# Patient Record
Sex: Female | Born: 1955 | Race: White | Hispanic: No | Marital: Married
Health system: Midwestern US, Community
[De-identification: ages and names within clinical notes are randomized; demographics above are authoritative.]

## PROBLEM LIST (undated history)

## (undated) DIAGNOSIS — E782 Mixed hyperlipidemia: Secondary | ICD-10-CM

## (undated) DIAGNOSIS — Z1231 Encounter for screening mammogram for malignant neoplasm of breast: Principal | ICD-10-CM

## (undated) DIAGNOSIS — E7800 Pure hypercholesterolemia, unspecified: Secondary | ICD-10-CM

## (undated) DIAGNOSIS — I341 Nonrheumatic mitral (valve) prolapse: Secondary | ICD-10-CM

## (undated) DIAGNOSIS — I4891 Unspecified atrial fibrillation: Secondary | ICD-10-CM

## (undated) DIAGNOSIS — I639 Cerebral infarction, unspecified: Secondary | ICD-10-CM

## (undated) DIAGNOSIS — I440 Atrioventricular block, first degree: Secondary | ICD-10-CM

## (undated) DIAGNOSIS — Q782 Osteopetrosis: Secondary | ICD-10-CM

## (undated) DIAGNOSIS — E876 Hypokalemia: Secondary | ICD-10-CM

## (undated) DIAGNOSIS — Z8673 Personal history of transient ischemic attack (TIA), and cerebral infarction without residual deficits: Secondary | ICD-10-CM

## (undated) DIAGNOSIS — Q251 Coarctation of aorta: Secondary | ICD-10-CM

## (undated) HISTORY — PX: TOE SURGERY: SHX1073

## (undated) HISTORY — DX: Nonrheumatic mitral (valve) prolapse: I34.1

## (undated) HISTORY — DX: Cerebral infarction, unspecified: I63.9

## (undated) HISTORY — DX: Personal history of transient ischemic attack (TIA), and cerebral infarction without residual deficits: Z86.73

## (undated) HISTORY — DX: Hypokalemia: E87.6

## (undated) HISTORY — DX: Osteopetrosis: Q78.2

## (undated) HISTORY — DX: Unspecified atrial fibrillation: I48.91

## (undated) HISTORY — PX: TUBAL LIGATION: SHX77

## (undated) HISTORY — DX: Atrioventricular block, first degree: I44.0

## (undated) HISTORY — DX: Coarctation of aorta: Q25.1

---

## 1999-01-12 ENCOUNTER — Other Ambulatory Visit: Admission: RE | Admit: 1999-01-12 | Discharge: 1999-01-12 | Payer: Self-pay | Admitting: Gynecology

## 1999-04-14 ENCOUNTER — Ambulatory Visit (HOSPITAL_COMMUNITY): Admission: RE | Admit: 1999-04-14 | Discharge: 1999-04-14 | Payer: Self-pay | Admitting: Gastroenterology

## 1999-04-14 ENCOUNTER — Encounter: Payer: Self-pay | Admitting: Gastroenterology

## 1999-07-27 ENCOUNTER — Encounter: Payer: Self-pay | Admitting: Cardiology

## 2000-01-19 ENCOUNTER — Other Ambulatory Visit: Admission: RE | Admit: 2000-01-19 | Discharge: 2000-01-19 | Payer: Self-pay | Admitting: Gynecology

## 2000-09-26 ENCOUNTER — Ambulatory Visit (HOSPITAL_COMMUNITY): Admission: RE | Admit: 2000-09-26 | Discharge: 2000-09-26 | Payer: Self-pay | Admitting: Gynecology

## 2001-02-25 ENCOUNTER — Other Ambulatory Visit: Admission: RE | Admit: 2001-02-25 | Discharge: 2001-02-25 | Payer: Self-pay | Admitting: Gynecology

## 2002-07-03 ENCOUNTER — Other Ambulatory Visit: Admission: RE | Admit: 2002-07-03 | Discharge: 2002-07-03 | Payer: Self-pay | Admitting: Gynecology

## 2003-10-14 ENCOUNTER — Other Ambulatory Visit: Admission: RE | Admit: 2003-10-14 | Discharge: 2003-10-14 | Payer: Self-pay | Admitting: Gynecology

## 2004-10-25 ENCOUNTER — Other Ambulatory Visit: Admission: RE | Admit: 2004-10-25 | Discharge: 2004-10-25 | Payer: Self-pay | Admitting: Gynecology

## 2005-10-26 ENCOUNTER — Other Ambulatory Visit: Admission: RE | Admit: 2005-10-26 | Discharge: 2005-10-26 | Payer: Self-pay | Admitting: Gynecology

## 2006-05-28 HISTORY — PX: MITRAL VALVE REPLACEMENT: SHX147

## 2006-10-28 ENCOUNTER — Other Ambulatory Visit: Admission: RE | Admit: 2006-10-28 | Discharge: 2006-10-28 | Payer: Self-pay | Admitting: Gynecology

## 2007-02-25 ENCOUNTER — Encounter: Payer: Self-pay | Admitting: Cardiology

## 2007-03-03 ENCOUNTER — Encounter: Payer: Self-pay | Admitting: Cardiology

## 2007-04-22 ENCOUNTER — Encounter (HOSPITAL_COMMUNITY): Admission: RE | Admit: 2007-04-22 | Discharge: 2007-05-28 | Payer: Self-pay | Admitting: Cardiology

## 2007-05-29 ENCOUNTER — Encounter (HOSPITAL_COMMUNITY): Admission: RE | Admit: 2007-05-29 | Discharge: 2007-07-11 | Payer: Self-pay | Admitting: Cardiology

## 2007-09-16 ENCOUNTER — Encounter: Payer: Self-pay | Admitting: Cardiology

## 2007-10-31 ENCOUNTER — Other Ambulatory Visit: Admission: RE | Admit: 2007-10-31 | Discharge: 2007-10-31 | Payer: Self-pay | Admitting: Gynecology

## 2008-10-01 ENCOUNTER — Encounter: Payer: Self-pay | Admitting: Cardiology

## 2008-10-21 ENCOUNTER — Encounter: Payer: Self-pay | Admitting: Cardiology

## 2008-11-15 ENCOUNTER — Other Ambulatory Visit: Admission: RE | Admit: 2008-11-15 | Discharge: 2008-11-15 | Payer: Self-pay | Admitting: Gynecology

## 2008-11-15 ENCOUNTER — Encounter: Payer: Self-pay | Admitting: Gynecology

## 2008-11-15 ENCOUNTER — Ambulatory Visit: Payer: Self-pay | Admitting: Gynecology

## 2009-01-07 ENCOUNTER — Encounter (INDEPENDENT_AMBULATORY_CARE_PROVIDER_SITE_OTHER): Payer: Self-pay | Admitting: *Deleted

## 2009-01-25 ENCOUNTER — Telehealth (INDEPENDENT_AMBULATORY_CARE_PROVIDER_SITE_OTHER): Payer: Self-pay | Admitting: *Deleted

## 2009-05-30 ENCOUNTER — Ambulatory Visit: Payer: Self-pay | Admitting: Cardiology

## 2009-05-30 DIAGNOSIS — I4891 Unspecified atrial fibrillation: Secondary | ICD-10-CM | POA: Insufficient documentation

## 2009-05-30 DIAGNOSIS — J45909 Unspecified asthma, uncomplicated: Secondary | ICD-10-CM

## 2009-05-30 DIAGNOSIS — M818 Other osteoporosis without current pathological fracture: Secondary | ICD-10-CM | POA: Insufficient documentation

## 2009-05-30 DIAGNOSIS — Z9889 Other specified postprocedural states: Secondary | ICD-10-CM

## 2009-05-30 HISTORY — DX: Other osteoporosis without current pathological fracture: M81.8

## 2009-05-30 HISTORY — DX: Other specified postprocedural states: Z98.890

## 2009-05-30 HISTORY — DX: Unspecified asthma, uncomplicated: J45.909

## 2009-10-06 ENCOUNTER — Ambulatory Visit: Payer: Self-pay

## 2009-10-06 ENCOUNTER — Ambulatory Visit: Payer: Self-pay | Admitting: Cardiology

## 2009-10-06 ENCOUNTER — Encounter: Payer: Self-pay | Admitting: Cardiology

## 2009-10-06 ENCOUNTER — Ambulatory Visit (HOSPITAL_COMMUNITY): Admission: RE | Admit: 2009-10-06 | Discharge: 2009-10-06 | Payer: Self-pay | Admitting: Cardiology

## 2009-10-06 ENCOUNTER — Ambulatory Visit: Payer: Self-pay | Admitting: Internal Medicine

## 2009-11-17 ENCOUNTER — Ambulatory Visit: Payer: Self-pay | Admitting: Gynecology

## 2009-11-17 ENCOUNTER — Other Ambulatory Visit: Admission: RE | Admit: 2009-11-17 | Discharge: 2009-11-17 | Payer: Self-pay | Admitting: Gynecology

## 2010-01-23 ENCOUNTER — Ambulatory Visit: Payer: Self-pay | Admitting: Cardiology

## 2010-06-29 NOTE — Assessment & Plan Note (Signed)
Summary: ROV   Visit Type:  Follow-up  CC:  No complains.  History of Present Illness: Doing well.  Very busy.  No real symptomatic limitations.  Wants to do a GXT, which would make sense.   Current Medications (verified): 1)  Aspir-Low 81 Mg Tbec (Aspirin) .... Take One Daily 2)  Bupropion Hcl 150 Mg Xr24h-Tab (Bupropion Hcl) .... Take One Daily 3)  Calcium Citrate 950 Mg Tabs (Calcium Citrate) .... Take One Daily 4)  Fish Oil 1000 Mg Caps (Omega-3 Fatty Acids) .... Take Two Daily 5)  Compete  Tabs (Multiple Vitamins-Minerals) .... Take One Daily  Allergies (verified): No Known Drug Allergies  Vital Signs:  Patient profile:   55 year old female Height:      67 inches Weight:      126.75 pounds BMI:     19.92 Pulse rate:   71 / minute Pulse rhythm:   regular Resp:     18 per minute BP sitting:   112 / 80  (right arm) Cuff size:   small  Vitals Entered By: Vikki Ports (Oct 06, 2009 10:07 AM)  Physical Exam  General:  Well developed, well nourished, in no acute distress. Head:  normocephalic and atraumatic Lungs:  Clear bilaterally to auscultation and percussion. Heart:  PMI non displaced.  No murmur, rub, or significant gallop.   Pulses:  pulses normal in all 4 extremities Extremities:  No clubbing or cyanosis. Neurologic:  Alert and oriented x 3.   EKG  Procedure date:  10/06/2009  Findings:      Normal rhythm.  Leftward axis.  IVCD.    Impression & Recommendations:  Problem # 1:  MITRAL VALVE REPLACEMENT, HX OF (ICD-V15.1) appears stable, with good exercise tolerance.  Echo reviewed with patient, formal report pending.   Will do exercise tolerance test.  Orders: Treadmill (Treadmill) EKG w/ Interpretation (93000)  Patient Instructions: 1)  Your physician recommends that you continue on your current medications as directed. Please refer to the Current Medication list given to you today. 2)  Your physician has requested that you have an exercise  tolerance test.  For further information please visit [email or web address]  Please also follow instruction sheet, as given.

## 2010-06-29 NOTE — Letter (Signed)
Summary: Appointment - Missed  [facility], Main Office  [address]   [place], [place] [address]   Phone: [phone]  Fax: [phone]     January 07, 2009 MRN: [id number]   [facility] [address] [place], [place]  [address]   Dear Ms. Ishler,  Our records indicate you missed your appointment on December 17, 2008 with Dr. Shirlee Latch. It is very important that we reach you to reschedule this appointment. We look forward to participating in your health care needs. Please contact us at the number listed above at your earliest convenience to reschedule this appointment.  Sincerely,    [facility] Scheduling Team

## 2010-06-29 NOTE — Progress Notes (Signed)
  FAXED RELEASE OF INFORMATION OVER TO DR.CHUNG'S OFFICE ASKING FOR RECORDS TO BE SENT HERE FOR DR.STUCKEY APPT @ 9:00AM  [facility]  January 25, 2009 12:20 PM    Appended Document:  Spoke with [facility] today they informed me that Dr.Chung is no longer @ this practice I would have to call [facility] and request the records, I called and left a Message with Sue Lush

## 2010-06-29 NOTE — Assessment & Plan Note (Signed)
Summary: MV replacement at [facility]   Visit Type:  Initial Consult  CC:  no complaints.  History of Present Illness: The patient lives across from Dr. Ricki Miller, had fast HR, was evaluated, found to have sig MR.   Patient had MVR at [facility] with Dr. Janey Genta in 2008.  She had had MVP for along time.  She subsequently was found to have worsening MR, and then was subsequently evaluated.   She underwent cardiac surgery at [facility] with Dr. Janey Genta.  Op note reviewed.   They attempted to repair the valve along with PFO closure.  There remained significant MR, and the valve was replaced.  By prior agreement, a tissue valve was inserted.   .  Patient opted to have a tissue valve, and did not want to be on Coumadin.  She did have some post op atrial fibrillation.   She does want she wants to do without any limitations.    Current Medications (verified): 1)  Aspir-Low 81 Mg Tbec (Aspirin) .... Take One Daily 2)  Bupropion Hcl 150 Mg Xr24h-Tab (Bupropion Hcl) .... Take One Daily 3)  Calcium Citrate 950 Mg Tabs (Calcium Citrate) .... Take One Daily 4)  Fish Oil 1000 Mg Caps (Omega-3 Fatty Acids) .... Take Two Daily 5)  Compete  Tabs (Multiple Vitamins-Minerals) .... Take One Daily  Allergies (verified): No Known Drug Allergies  Family History: Father 11 alive   diabetic, prior CVA Mother 58 going strong 5 brother and sisters---one young sister has hypertension.  Social History: Smoke.  Two children.  Occasional glass of wine.  Works full time.  Works for [employer], and travels with the jeans.  Her children are 15 and 11.    Review of Systems  The patient denies anorexia, fever, weight loss, weight gain, vision loss, decreased hearing, hoarseness, chest pain, syncope, dyspnea on exertion, peripheral edema, prolonged cough, headaches, hemoptysis, abdominal pain, melena, hematochezia, severe indigestion/heartburn, hematuria, incontinence, suspicious skin lesions, transient blindness, difficulty walking, depression, unusual  weight change, and abnormal bleeding.    Vital Signs:  Patient profile:   55 year old female Height:      67 inches Weight:      126 pounds Pulse rate:   72 / minute Pulse rhythm:   regular BP sitting:   102 / 80  (left arm)  Vitals Entered By: Jacquelin Hawking, CMA (May 30, 2009 2:36 PM)  Physical Exam  General:  Well developed, well nourished, in no acute distress. Head:  normocephalic and atraumatic Lungs:  Clear bilaterally to auscultation and percussion. Heart:  Prior surgery.  No definite murmur at present.  Normal S1 and S2 without rub or gallop. Abdomen:  Bowel sounds positive; abdomen soft and non-tender without masses, organomegaly, or hernias noted. No hepatosplenomegaly. Extremities:  No clubbing or cyanosis.  No edema. Neurologic:  Alert and oriented x 3.   EKG  Procedure date:  04/29/2009  Findings:      NSR.  Mild nonspecific IVCD.  No acute changes.  Nuclear ETT  Procedure date:  10/21/2008  Findings:      Report from Dr. Konrad Felix, MD, PhD.  No symptoms.  Minor couplet. Appropriate HR response.  Gated spec EF 39% which underestimates. Suggest echo.  No inducible ischemia.    Echocardiogram  Procedure date:  10/01/2008  Findings:      Normal size and borderline LV function..  Ef 45-50%.  Septal strain with hypokinesis of the anterior wall.  LA is enlarged.  Bioprosthesis, estimated valve area 2.7-2.9cm2.  NOrmal Pericardium.  Read by Elwin Mocha, MD, PhD.    Echocardiogram  Procedure date:  09/16/2007  Findings:      Normal LV size and function.  Torelli  Impression & Recommendations:  Problem # 1:  MITRAL VALVE REPLACEMENT, HX OF (ICD-V15.1) Patient had replacement of MV with [employer] 33-mm tissue prosthesis by Dr. Janey Genta.  She underwent attempted robotic MV repair by Dr. Janey Genta at [facility], but it could not be done because of heavy posterior calcification.  She remains stable.  Her EF was down slightly on last echo at [place], but  suggest we repeat this in near future.   She is agreeable.   Orders: EKG w/ Interpretation (93000) Echocardiogram (Echo)  Problem # 2:  ATRIAL FIBRILLATION (ICD-427.31) Just post op.  Not an ongoing issue. Her updated medication list for this problem includes:    Aspir-low 81 Mg Tbec (Aspirin) .Marland Kitchen... Take one daily  Problem # 3:  ASTHMA, UNSPECIFIED, UNSPECIFIED STATUS (ICD-493.90)  Problem # 4:  OTHER OSTEOPOROSIS (ICD-733.09)  Patient Instructions: 1)  Your physician recommends that you schedule a follow-up appointment in: May 2011 2)  Your physician has requested that you have an echocardiogram in May 2011.  Echocardiography is a painless test that uses sound waves to create images of your heart. It provides your doctor with information about the size and shape of your heart and how well your heart's chambers and valves are working.  This procedure takes approximately one hour. There are no restrictions for this procedure.

## 2010-06-29 NOTE — Letter (Signed)
Summary: EKG [facility]   EKG [facility]   Imported By: Roderic Ovens 12/27/2009 10:23:05  _____________________________________________________________________  External Attachment:    Type:   Image     Comment:   External Document

## 2010-06-29 NOTE — Op Note (Signed)
Summary: [facility]   [facility]   Imported By: Roderic Ovens 12/27/2009 10:16:45  _____________________________________________________________________  External Attachment:    Type:   Image     Comment:   External Document

## 2010-06-29 NOTE — Letter (Signed)
Summary: [facility]   [facility]   Imported By: Roderic Ovens 12/27/2009 10:16:04  _____________________________________________________________________  External Attachment:    Type:   Image     Comment:   External Document

## 2010-10-13 NOTE — H&P (Signed)
[facility]  Patient:    Martha Clark, Martha Clark                       MRN: [id number] Adm. Date:  09/26/00 Attending:  Gaetano Hawthorne. Lily Peer, M.D.                         History and Physical  CHIEF COMPLAINT:              Request elective permanent sterilization.  HISTORY:                      The patient is a 55 year old gravida 2, para 1, Ab1 who was seen in our office for her annual gynecological examination on August 1st and then subsequently on April 15th for her preoperative consultation.  The patient has voiced request for information on permanent sterilization and was provided with literature information from the ACOG on laparoscopic bilateral tubal sterilization procedure.  She had decided to proceed with that procedure and is scheduled for Thursday, May 2nd, at 7:30 a.m. at [facility].  The patient has been under the care of Dr. Macarthur Critchley. Torelli, for which she is followed for her mitral valve prolapse.  She recently had seen him and he has given her medical clearance to proceed with a tubal sterilization procedure.  The only recommendation was to have endocarditis prophylaxis.  She has a history of mitral valve prolapse with moderate mitral regurgitation and she has been asymptomatic from a cardiac standpoint.  There has been no history of any heart failure and she had an exercise echocardiogram done on February 06, 2000 and was reported an excellent exercise tolerance.  MEDICATIONS:                  She is currently on no medications.  She takes Allegra p.r.n.  PAST MEDICAL HISTORY:         She has had a tonsillectomy and adenoidectomy in the past.  She has had one normal spontaneous vaginal delivery.  PHYSICAL EXAMINATION:  GENERAL:                      Well-developed, well-nourished female.  HEENT:                        Unremarkable.  NECK:                         Supple.  Trachea midline.  No carotid bruits  or thyromegaly.  LUNGS:                        Clear to auscultation, without rhonchi or wheezes.  HEART:                        At parasternal border, she has systolic murmur, grade 2-3/6, but no gallops.  BREASTS:                      Recently not examined but were examined at her annual exam in August of the year 2001 which were reported to be normal.  ABDOMEN:                      Soft, nontender, without rebound or guarding.  PELVIC:  Bartholins, urethra and Skenes glands within normal limits.  Vagina and cervix:  No lesion or discharge.  Uterus anteverted, normal size, shape and consistency.  Adnexa without mass or tenderness.  RECTAL:                       Unremarkable.  ASSESSMENT:                   [age]-year-old gravida 2, para 1, abortus 1, who requests elective permanent sterilization.  Patient with history of myxomatous degeneration of the mitral valve and mildly dilated left ventricle and normal left ventricular systolic function.  She has a thickened mitral valve with bileaflet prolapse and some mitral regurgitation, recently had been cleared by cardiologist, Dr. Harland Dingwall.  Patient with an exercise echocardiogram done February 06, 2000 and was reported to be excellent exercise tolerance.  Only recommendation was endocarditis prophylaxis.  The patient will receive prophylactic antibiotic during the laparoscopic bilateral tubal sterilization procedure.  The risks, benefits, pros and cons of the procedure to include infection, bleeding and trauma to internal organs were discussed.  In the event that any technical complication, we had discussed that we may need to proceed with an open laparotomy and correct any trauma that may have been caused as a result of the laparoscopic technique, or in the event that the tubes are inaccessible, then she will have to have a laparotomy to gain access to the tubes to complete the operation by that  route, which would then require the patient to be in the hospital for several other days. Patient understands that this procedure is permanent and she will no longer be able to have any children.  All questions were answered and will follow accordingly.  PLAN:                         Patient is scheduled to undergo laparoscopic tubal sterilization procedure, Hulka clip technique, Thursday, May 2nd, at 7:30 a.m. at [facility].  Please have history and physical available. DD:  09/25/00 TD:  09/26/00 Job: [id number]

## 2010-10-13 NOTE — Op Note (Signed)
[facility]  Patient:    Martha Clark, Martha Clark                     MRN: [id number] Proc. Date: 09/26/00 Adm. Date:  [id number] Attending:  Tonye Royalty                           Operative Report  PREOPERATIVE DIAGNOSIS:       Request for elective permanent sterilization.  POSTOPERATIVE DIAGNOSIS:      Request for elective permanent sterilization.  OPERATION:                    Laparoscopic tubal sterilization procedure and                               Hulka clip technique, bilateral.  SURGEON:                      Juan H. Lily Peer, M.D.  ANESTHESIA:                   General endotracheal.  FINDINGS:                     Normal maternal pelvic anatomy.  INDICATIONS FOR PROCEDURE:    A 55 year old gravida 1, para 1, female requesting elective permanent sterilization.  The patient previously counseled as to risks, benefits, pros and cons of elective sterilization procedure.  The patient previously had been provided the literature information from the [employer] of Ob/Gyn, ________ laparoscopic tubal sterilization procedure.  DESCRIPTION OF PROCEDURE:     After the patient was adequate counseled, she was taken to the operating room.  She had received 1 g of Cefotan for SBE prophylaxis due to her history of mitral valve prolapse.  While on the operating room table, she was placed in the lithotomy position after general endotracheal anesthesia was obtained.  Her abdomen, vagina, and perineum were prepped and draped in the usual sterile fashion.  A red rubber Roxan Hockey was utilized the evacuate the bladder contents and examination under anesthesia confirmed an anteverted uterus with no palpable adnexal masses, and the Hulka tenaculum had been placed for manipulation during the laparoscopic procedure. The bladder had been evacuated of its contents with the red rubber Roxan Hockey for approximately 75 cc.  After the drapes were in place, a small  stab incision was made at the infraumbilical region followed by insertion of the Veress needle.  The opening intra-abdominal pressure was 5 mmHg, and approximately 2 L of carbon dioxide were insufflated into the peritoneal cavity.  After this, the Veress needle was removed.  The 10 mm trocar was inserted and the laparoscope was inserted into the peritoneal cavity for a systematic inspection of the entire pelvic cavity with no gross abnormalities noted.  A second puncture site was made 2 cm above the symphysis at the midline with a 5 mm trocar for an additional port for instrumentation.  The right fallopian tube was identified and were the self-retaining grasper was placed under tension, the proximal one-third portion was identified, and the Hulka clip was placed, completely occluding the small segment of the fallopian tube, and a similar procedure was carried down the contralateral side.  After pictures were obtained, the carbon dioxide was removed.  The instruments were removed.  The subumbilical incision in the fascia  was closed with a locking stitch suture of 0 Vicryl suture and the skin was reapproximated with subcuticular stitch of 4-0 plain catgut suture.  The suprapubic incision was closed with interrupted sutures of 4-0 plain catgut suture.  For postoperative analgesia, 10 cc of 0.25% Marcaine was infiltrated into both incision sites and dressings were placed afterwards.  The Hulka tenaculum was removed.  The patient was awakened and transferred to the recovery room with stable vital signs.  She received 30 mg of Toradol and brought to the recovery room.  Blood loss was minimal and fluid resuscitation consisted of 1600 cc of lactated Ringers. DD:  09/26/00 TD:  09/27/00 Job: [id number]

## 2010-12-20 ENCOUNTER — Ambulatory Visit: Payer: Self-pay | Admitting: Cardiology

## 2010-12-20 ENCOUNTER — Encounter: Payer: Self-pay | Admitting: Gynecology

## 2010-12-27 ENCOUNTER — Encounter: Payer: Self-pay | Admitting: Cardiology

## 2010-12-28 ENCOUNTER — Ambulatory Visit (INDEPENDENT_AMBULATORY_CARE_PROVIDER_SITE_OTHER): Payer: 59 | Admitting: Cardiology

## 2010-12-28 ENCOUNTER — Encounter: Payer: Self-pay | Admitting: Cardiology

## 2010-12-28 VITALS — BP 115/78 | HR 68 | Resp 14 | Ht 67.0 in | Wt 122.0 lb

## 2010-12-28 DIAGNOSIS — Z9889 Other specified postprocedural states: Secondary | ICD-10-CM

## 2010-12-28 DIAGNOSIS — I059 Rheumatic mitral valve disease, unspecified: Secondary | ICD-10-CM

## 2010-12-28 DIAGNOSIS — I341 Nonrheumatic mitral (valve) prolapse: Secondary | ICD-10-CM

## 2010-12-28 NOTE — Patient Instructions (Signed)
Your physician recommends that you schedule a follow-up appointment in: 1 year   Your physician has requested that you have an echocardiogram. Echocardiography is a painless test that uses sound waves to create images of your heart. It provides your doctor with information about the size and shape of your heart and how well your heart's chambers and valves are working. This procedure takes approximately one hour. There are no restrictions for this procedure. To be done in 1 year.   Your physician has requested that you have an exercise tolerance test. For further information please visit [email or web address] Please also follow instruction sheet, as given. To be done in 1 year.

## 2011-01-06 NOTE — Assessment & Plan Note (Addendum)
She seems to be doing well. We discussed her surgery and the findings.  It was done by Dr. Janey Genta at [facility].  Would recommend an echo every couple of years given bioprosthetic MV.  She should continue her activity as she chooses without limitation.

## 2011-01-06 NOTE — Progress Notes (Signed)
HPI:  She and I had a nice discussion today.  She is doing well.  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 She is however excited about the culture change at [employer], and is rejuvenated to some extent.  We shared the experience about the [facility].  She does not get short of breath with exercise, and is staying active.  No orthopnea.  We reviewed her lifeline screening tests that she got:  Her BMI was 19, ABI, carotids, and abdominal aorta are all listed as normal.  We will send this file for report.  Her CRP was 1.0, LDL was 114.  Total was 187.  We talked about all of these entities.  HDL is 58.    Current Outpatient Prescriptions  Medication Sig Dispense Refill  . aspirin 81 MG tablet Take 81 mg by mouth daily.        Marland Kitchen buPROPion (WELLBUTRIN XL) 150 MG 24 hr tablet Take 1 capsule by mouth Daily.      . calcium citrate-vitamin D (CITRACAL+D) 315-200 MG-UNIT per tablet Take 1 tablet by mouth daily.        . Cholecalciferol (VITAMIN D) 1000 UNITS capsule Take 1,000 Units by mouth daily.        . Coenzyme Q10 (COQ-10) 100 MG CAPS Take 1 tablet by mouth daily.        Marland Kitchen MAGNESIUM CHLORIDE PO Take 1 tablet by mouth daily.        . Multiple Vitamin (MULTIVITAMIN) capsule Take 1 capsule by mouth daily.        . Omega-3 Fatty Acids (FISH OIL PO) 2 teaspoons daily         No Known Allergies  Past Medical History  Diagnosis Date  . Atrial fibrillation   . Asthma   . Osteoporosis   . Mitral valve prolapse     No past surgical history on file.  No family history on file.  History   Social History  . Marital Status: Unknown    Spouse Name: N/A    Number of Children: N/A  . Years of Education: N/A   Occupational History  . Not on file.   Social History Main Topics  . Smoking status: Never Smoker   . Smokeless tobacco: Not on file  . Alcohol Use: Not on  file  . Drug Use: Not on file  . Sexually Active: Not on file   Other Topics Concern  . Not on file   Social History Narrative  . No narrative on file    ROS: Please see the HPI.  All other systems reviewed and negative.  PHYSICAL EXAM:  BP 115/78  Pulse 68  Resp 14  Ht 5\' 7"  (1.702 m)  Wt 122 lb (55.339 kg)  BMI 19.11 kg/m2  General: Well developed, well nourished, in no acute distress. Head:  Normocephalic and atraumatic. Neck: no JVD Lungs: Clear to auscultation and percussion. Heart: Normal S1 and S2.  No murmur, rubs or gallops.  Abdomen:  Normal bowel sounds; soft; non tender; no organomegaly Pulses: Pulses normal in all 4 extremities. Extremities: No clubbing or cyanosis. No edema. Neurologic: Alert and oriented x 3.  EKG:  NSR.  LAE.  Leftward axis.  IVCD.    ASSESSMENT AND PLAN:

## 2011-01-24 ENCOUNTER — Encounter: Payer: Self-pay | Admitting: Gynecology

## 2011-09-27 ENCOUNTER — Observation Stay (HOSPITAL_COMMUNITY)
Admission: EM | Admit: 2011-09-27 | Discharge: 2011-09-28 | Disposition: A | Discharge status: Home / Self Care | Payer: 59 | Attending: Emergency Medicine | Admitting: Emergency Medicine

## 2011-09-27 ENCOUNTER — Emergency Department (HOSPITAL_COMMUNITY): Payer: 59

## 2011-09-27 ENCOUNTER — Encounter (HOSPITAL_COMMUNITY): Payer: Self-pay | Admitting: Emergency Medicine

## 2011-09-27 DIAGNOSIS — I059 Rheumatic mitral valve disease, unspecified: Secondary | ICD-10-CM | POA: Insufficient documentation

## 2011-09-27 DIAGNOSIS — I4891 Unspecified atrial fibrillation: Secondary | ICD-10-CM | POA: Insufficient documentation

## 2011-09-27 DIAGNOSIS — M810 Age-related osteoporosis without current pathological fracture: Secondary | ICD-10-CM | POA: Insufficient documentation

## 2011-09-27 DIAGNOSIS — R079 Chest pain, unspecified: Principal | ICD-10-CM | POA: Insufficient documentation

## 2011-09-27 DIAGNOSIS — R0602 Shortness of breath: Secondary | ICD-10-CM | POA: Insufficient documentation

## 2011-09-27 DIAGNOSIS — J45909 Unspecified asthma, uncomplicated: Secondary | ICD-10-CM | POA: Insufficient documentation

## 2011-09-27 LAB — DIFFERENTIAL
Basophils Absolute: 0 10*3/uL (ref 0.0–0.1)
Basophils Relative: 0 % (ref 0–1)
Eosinophils Absolute: 0.1 10*3/uL (ref 0.0–0.7)
Eosinophils Relative: 1 % (ref 0–5)
Lymphocytes Relative: 14 % (ref 12–46)
Lymphs Abs: 1.3 10*3/uL (ref 0.7–4.0)
Monocytes Absolute: 0.7 10*3/uL (ref 0.1–1.0)
Monocytes Relative: 7 % (ref 3–12)
Neutro Abs: 7.6 10*3/uL (ref 1.7–7.7)
Neutrophils Relative %: 78 % — ABNORMAL HIGH (ref 43–77)

## 2011-09-27 LAB — COMPREHENSIVE METABOLIC PANEL
ALT: 17 U/L (ref 0–35)
AST: 24 U/L (ref 0–37)
Albumin: 3.8 g/dL (ref 3.5–5.2)
Alkaline Phosphatase: 95 U/L (ref 39–117)
BUN: 16 mg/dL (ref 6–23)
CO2: 29 mEq/L (ref 19–32)
Calcium: 9.6 mg/dL (ref 8.4–10.5)
Chloride: 104 mEq/L (ref 96–112)
Creatinine, Ser: 0.71 mg/dL (ref 0.50–1.10)
GFR calc Af Amer: >90 mL/min (ref >90)
GFR calc non Af Amer: >90 mL/min (ref >90)
Glucose, Bld: 94 mg/dL (ref 70–99)
Potassium: 3.8 mEq/L (ref 3.5–5.1)
Sodium: 141 mEq/L (ref 135–145)
Total Bilirubin: 0.4 mg/dL (ref 0.3–1.2)
Total Protein: 6.8 g/dL (ref 6.0–8.3)

## 2011-09-27 LAB — POCT I-STAT TROPONIN I
Troponin i, poc: 0 ng/mL (ref 0.00–0.08)
Troponin i, poc: 0.03 ng/mL (ref 0.00–0.08)
Troponin i, poc: 0 ng/mL (ref 0.00–0.08)

## 2011-09-27 LAB — URINALYSIS, ROUTINE W REFLEX MICROSCOPIC
Bilirubin Urine: NEGATIVE
Glucose, UA: NEGATIVE mg/dL
Hgb urine dipstick: NEGATIVE
Ketones, ur: NEGATIVE mg/dL
Leukocytes, UA: NEGATIVE
Nitrite: NEGATIVE
Protein, ur: NEGATIVE mg/dL
Specific Gravity, Urine: 1.016 (ref 1.005–1.030)
Urobilinogen, UA: 0.2 mg/dL (ref 0.0–1.0)
pH: 8 (ref 5.0–8.0)

## 2011-09-27 LAB — CBC
HCT: 42.5 % (ref 36.0–46.0)
Hemoglobin: 14.8 g/dL (ref 12.0–15.0)
MCH: 29.9 pg (ref 26.0–34.0)
MCHC: 34.8 g/dL (ref 30.0–36.0)
MCV: 85.9 fL (ref 78.0–100.0)
Platelets: 157 10*3/uL (ref 150–400)
RBC: 4.95 MIL/uL (ref 3.87–5.11)
RDW: 12.6 % (ref 11.5–15.5)
WBC: 9.7 10*3/uL (ref 4.0–10.5)

## 2011-09-27 LAB — D-DIMER, QUANTITATIVE: D-Dimer, Quant: 0.31 ug/mL-FEU (ref 0.00–0.48)

## 2011-09-27 MED ORDER — NITROGLYCERIN 0.4 MG SL SUBL: 0.4000 mg | SUBLINGUAL_TABLET | SUBLINGUAL | Status: DC | PRN

## 2011-09-27 MED ORDER — GI COCKTAIL ~~LOC~~
30.0000 mL | Freq: Once | ORAL | Status: AC
  Administered 2011-09-27: 30 mL via ORAL
  Filled 2011-09-27: qty 30

## 2011-09-27 MED ORDER — ASPIRIN 81 MG PO CHEW: 324.0000 mg | CHEWABLE_TABLET | Freq: Once | ORAL | Status: DC

## 2011-09-27 MED ORDER — FENTANYL CITRATE 0.05 MG/ML IJ SOLN
50.0000 ug | Freq: Once | INTRAMUSCULAR | Status: AC
  Administered 2011-09-27: 50 ug via INTRAVENOUS
  Filled 2011-09-27: qty 2

## 2011-09-27 MED ORDER — METOPROLOL TARTRATE 25 MG PO TABS
100.0000 mg | ORAL_TABLET | Freq: Once | ORAL | Status: AC
  Administered 2011-09-27: 100 mg via ORAL
  Filled 2011-09-27: qty 4

## 2011-09-27 NOTE — ED Notes (Signed)
IV team paged for 18g for CT heart.

## 2011-09-27 NOTE — ED Notes (Addendum)
Spoke with Dr. Reche Dixon [phone]) regarding CT. Will give metoprolol per protocol orders and call back in approx 45 minutes to inform him about heart rate.

## 2011-09-27 NOTE — ED Provider Notes (Signed)
History     CSN: [id number]  Arrival date & time 09/27/11  0843   First MD Initiated Contact with Patient 09/27/11 0931      11:19 AM HPI Ports she was sleeping this morning when she was awakened abruptly by a substernal chest pain. States pain felt like severe heartburn. Reports pain has somewhat subsided but she continues to have mild pressure-like pain. Reports symptoms were associated with mild shortness of breath. Denies nausea, diaphoresis, abdominal pain, nausea, vomiting, back pain. Patient denies history of hypertension, hyperlipidemia, coronary artery disease, history of MI, history of PE/DVT, hormone therapy, cancer. Patient reports that she recently traveled to [place] by air yesterday and then returned.  Patient is a 56 y.o. female presenting with chest pain. The history is provided by the patient.  Chest Pain The chest pain began 1 - 2 hours ago. Chest pain occurs constantly. The chest pain is unchanged. The severity of the pain is severe. The quality of the pain is described as sharp, burning and pressure-like. The pain does not radiate. Primary symptoms include shortness of breath. Pertinent negatives for primary symptoms include no fever, no fatigue, no cough, no wheezing, no palpitations, no abdominal pain, no nausea, no vomiting, no dizziness and no altered mental status.  Pertinent negatives for associated symptoms include no claudication, no diaphoresis, no lower extremity edema, no numbness and no weakness. She tried antacids for the symptoms.  Pertinent negatives for past medical history include no CAD, no diabetes, no hyperhomocysteinemia, no hyperlipidemia, no MI and no PE.     Past Medical History  Diagnosis Date  . Atrial fibrillation   . Asthma   . Osteoporosis   . Mitral valve prolapse     Past Surgical History  Procedure Date  . Tubal ligation     No family history on file.  History  Substance Use Topics  . Smoking status: Never Smoker   .  Smokeless tobacco: Not on file  . Alcohol Use: No    OB History    Grav Para Term Preterm Abortions TAB SAB Ect Mult Living                  Review of Systems  Constitutional: Negative for fever, diaphoresis and fatigue.  HENT: Negative for neck pain.   Respiratory: Positive for shortness of breath. Negative for cough and wheezing.   Cardiovascular: Positive for chest pain. Negative for palpitations, claudication and leg swelling.  Gastrointestinal: Negative for nausea, vomiting and abdominal pain.  Neurological: Negative for dizziness, weakness, numbness and headaches.  Psychiatric/Behavioral: Negative for altered mental status.  All other systems reviewed and are negative.    Allergies  Review of patient's allergies indicates no known allergies.  Home Medications   Current Outpatient Rx  Name Route Sig Dispense Refill  . ASPIRIN 81 MG PO TABS Oral Take 81 mg by mouth daily.      . BUPROPION HCL ER (XL) 150 MG PO TB24 Oral Take 1 capsule by mouth Daily.    Marland Kitchen CALCIUM CITRATE-VITAMIN D 315-200 MG-UNIT PO TABS Oral Take 1 tablet by mouth daily.      Marland Kitchen VITAMIN D 1000 UNITS PO CAPS Oral Take 1,000 Units by mouth daily.      . COQ-10 100 MG PO CAPS Oral Take 1 tablet by mouth daily.      Marland Kitchen FISH OIL OIL Does not apply 15 mLs by Does not apply route daily.    Marland Kitchen MAGNESIUM CHLORIDE PO Oral Take 1  tablet by mouth daily.      . MULTIVITAMINS PO CAPS Oral Take 1 capsule by mouth daily.      Marland Kitchen PRESCRIPTION MEDICATION Topical Apply 20 drops topically 2 (two) times daily. Sample topical medication from [facility] for swelling in right foot.    Marland Kitchen VITAMIN C 500 MG PO TABS Oral Take 500 mg by mouth daily.      BP 124/78  Pulse 80  Temp(Src) 98 F (36.7 C) (Oral)  Resp 18  Ht 5\' 7"  (1.702 m)  Wt 125 lb (56.7 kg)  BMI 19.58 kg/m2  SpO2 99%  Physical Exam  Vitals reviewed. Constitutional: She is oriented to person, place, and time. Vital signs are normal. She appears  well-developed and well-nourished.  HENT:  Head: Normocephalic and atraumatic.  Eyes: Conjunctivae are normal. Pupils are equal, round, and reactive to light.  Neck: Normal range of motion. Neck supple.  Cardiovascular: Normal rate, regular rhythm and normal heart sounds.  Exam reveals no friction rub.   No murmur heard. Pulmonary/Chest: Effort normal and breath sounds normal. She has no wheezes. She has no rhonchi. She has no rales. She exhibits no tenderness.  Musculoskeletal: Normal range of motion.  Neurological: She is alert and oriented to person, place, and time. Coordination normal.  Skin: Skin is warm and dry. No rash noted. No erythema. No pallor.    ED Course  Procedures   Results for orders placed during the hospital encounter of 09/27/11  CBC      Component Value Range   WBC 9.7  4.0 - 10.5 (K/uL)   RBC 4.95  3.87 - 5.11 (MIL/uL)   Hemoglobin 14.8  12.0 - 15.0 (g/dL)   HCT 66.4  40.3 - 47.4 (%)   MCV 85.9  78.0 - 100.0 (fL)   MCH 29.9  26.0 - 34.0 (pg)   MCHC 34.8  30.0 - 36.0 (g/dL)   RDW 25.9  56.3 - 87.5 (%)   Platelets 157  150 - 400 (K/uL)  DIFFERENTIAL      Component Value Range   Neutrophils Relative 78 (*) 43 - 77 (%)   Neutro Abs 7.6  1.7 - 7.7 (K/uL)   Lymphocytes Relative 14  12 - 46 (%)   Lymphs Abs 1.3  0.7 - 4.0 (K/uL)   Monocytes Relative 7  3 - 12 (%)   Monocytes Absolute 0.7  0.1 - 1.0 (K/uL)   Eosinophils Relative 1  0 - 5 (%)   Eosinophils Absolute 0.1  0.0 - 0.7 (K/uL)   Basophils Relative 0  0 - 1 (%)   Basophils Absolute 0.0  0.0 - 0.1 (K/uL)  COMPREHENSIVE METABOLIC PANEL      Component Value Range   Sodium 141  135 - 145 (mEq/L)   Potassium 3.8  3.5 - 5.1 (mEq/L)   Chloride 104  96 - 112 (mEq/L)   CO2 29  19 - 32 (mEq/L)   Glucose, Bld 94  70 - 99 (mg/dL)   BUN 16  6 - 23 (mg/dL)   Creatinine, Ser 6.43  0.50 - 1.10 (mg/dL)   Calcium 9.6  8.4 - 32.9 (mg/dL)   Total Protein 6.8  6.0 - 8.3 (g/dL)   Albumin 3.8  3.5 - 5.2 (g/dL)    AST 24  0 - 37 (U/L)   ALT 17  0 - 35 (U/L)   Alkaline Phosphatase 95  39 - 117 (U/L)   Total Bilirubin 0.4  0.3 - 1.2 (mg/dL)  GFR calc non Af Amer >90  >90 (mL/min)   GFR calc Af Amer >90  >90 (mL/min)  D-DIMER, QUANTITATIVE      Component Value Range   D-Dimer, Quant 0.31  0.00 - 0.48 (ug/mL-FEU)  POCT I-STAT TROPONIN I      Component Value Range   Troponin i, poc 0.00  0.00 - 0.08 (ng/mL)   Comment 3            Dg Chest 2 View  09/27/2011  *RADIOLOGY REPORT*  Clinical Data: Chest pain and weakness  CHEST - 2 VIEW  Comparison: None  Findings: Heart size appears normal.  No pleural effusion or pulmonary edema.  Lungs appear hyperinflated and there are coarsened interstitial markings.  No focal airspace consolidation identified.  IMPRESSION:  1.  No acute cardiopulmonary abnormalities.  Original Report Authenticated By: Rosealee Albee, M.D.   ED ECG REPORT   Date: 09/27/2011  EKG Time: 11:52 AM  Rate: 89  Rhythm: Sinus rhythm with first-degree AV block,  there are no previous tracings available for comparison  Axis: left axis deviation   Intervals:nonspecific intraventricular conduction delay  ST&T Change: none              MDM   Will order 3 hour troponin. Pending move to CDU for chest pain protocol. Patient has low risk for ACS. Discussed plan with Dr. Ignacia Palma and Langley Adie PA-C       Thomasene Lot, PA-C 09/27/11 1151  Thomasene Lot, PA-C 09/27/11 1155  Thomasene Lot, PA-C 09/27/11 1204

## 2011-09-27 NOTE — ED Provider Notes (Signed)
12:03 PM New onset of chest pain in precordium early this morning, felt a burning feeling and some sharp pains.  Pain has largely subsided now.  She had had no prior similar episode.  Her heath is usually good.  Exam shows lungs clear, heart sounds normal, abdomen soft and nontender.  EKG non-acute, initial TNI negative.  I recommended observation under the chest pain protocol in the CDU.   Carleene Cooper III, MD 09/27/11 [phone]

## 2011-09-27 NOTE — ED Provider Notes (Signed)
Medical screening examination/treatment/procedure(s) were conducted as a shared visit with non-physician practitioner(s) and myself.  I personally evaluated the patient during the encounter 12:03 PM  New onset of chest pain in precordium early this morning, felt a burning feeling and some sharp pains. Pain has largely subsided now. She had had no prior similar episode. Her heath is usually good. Exam shows lungs clear, heart sounds normal, abdomen soft and nontender. EKG non-acute, initial TNI negative. I recommended observation under the chest pain protocol in the CDU.      Carleene Cooper III, MD 09/27/11 2023

## 2011-09-27 NOTE — ED Provider Notes (Signed)
Medical screening examination/treatment/procedure(s) were performed by non-physician practitioner and as supervising physician I was immediately available for consultation/collaboration.   Carleene Cooper III, MD 09/27/11 2024

## 2011-09-27 NOTE — ED Notes (Signed)
Ambulated pt 

## 2011-09-27 NOTE — ED Notes (Signed)
Reports sudden onset CP at 0500 while sleeping, was SOB and nauseas - on arrival denies SOB/nausea, is tearful and anxious on arrival, states 5/10, took 81 mg ASA pta, no recent illness or other complaints, NAD

## 2011-09-27 NOTE — ED Provider Notes (Signed)
History     CSN: [id number]  Arrival date & time 09/27/11  8657   First MD Initiated Contact with Patient 09/27/11 [phone]      Chief Complaint  Patient presents with  . Chest Pain    (Consider location/radiation/quality/duration/timing/severity/associated sxs/prior treatment) HPI  Past Medical History  Diagnosis Date  . Atrial fibrillation   . Asthma   . Osteoporosis   . Mitral valve prolapse     Past Surgical History  Procedure Date  . Tubal ligation     No family history on file.  History  Substance Use Topics  . Smoking status: Never Smoker   . Smokeless tobacco: Not on file  . Alcohol Use: No    OB History    Grav Para Term Preterm Abortions TAB SAB Ect Mult Living                  Review of Systems  Allergies  Review of patient's allergies indicates no known allergies.  Home Medications   Current Outpatient Rx  Name Route Sig Dispense Refill  . ASPIRIN 81 MG PO TABS Oral Take 81 mg by mouth daily.      . BUPROPION HCL ER (XL) 150 MG PO TB24 Oral Take 1 capsule by mouth Daily.    Marland Kitchen CALCIUM CITRATE-VITAMIN D 315-200 MG-UNIT PO TABS Oral Take 1 tablet by mouth daily.      Marland Kitchen VITAMIN D 1000 UNITS PO CAPS Oral Take 1,000 Units by mouth daily.      . COQ-10 100 MG PO CAPS Oral Take 1 tablet by mouth daily.      Marland Kitchen FISH OIL OIL Does not apply 15 mLs by Does not apply route daily.    Marland Kitchen MAGNESIUM CHLORIDE PO Oral Take 1 tablet by mouth daily.      . MULTIVITAMINS PO CAPS Oral Take 1 capsule by mouth daily.      Marland Kitchen PRESCRIPTION MEDICATION Topical Apply 20 drops topically 2 (two) times daily. Sample topical medication from [facility] for swelling in right foot.    Marland Kitchen VITAMIN C 500 MG PO TABS Oral Take 500 mg by mouth daily.      BP 110/71  Pulse 63  Temp(Src) 98 F (36.7 C) (Oral)  Resp 18  Ht 5\' 7"  (1.702 m)  Wt 128 lb 6.4 oz (58.242 kg)  BMI 20.11 kg/m2  SpO2 98%  Physical Exam  ED Course  Procedures (including critical care time)  Labs  Reviewed  DIFFERENTIAL - Abnormal; Notable for the following:    Neutrophils Relative 78 (*)    All other components within normal limits  CBC  COMPREHENSIVE METABOLIC PANEL  URINALYSIS, ROUTINE W REFLEX MICROSCOPIC  D-DIMER, QUANTITATIVE  POCT I-STAT TROPONIN I   Dg Chest 2 View  09/27/2011  *RADIOLOGY REPORT*  Clinical Data: Chest pain and weakness  CHEST - 2 VIEW  Comparison: None  Findings: Heart size appears normal.  No pleural effusion or pulmonary edema.  Lungs appear hyperinflated and there are coarsened interstitial markings.  No focal airspace consolidation identified.  IMPRESSION:  1.  No acute cardiopulmonary abnormalities.  Original Report Authenticated By: Rosealee Albee, M.D.     1. Chest pain     3:20 PM Patient on CPP, pending stress echo tomorrow morning.   Vital signs reviewed and are as follows: Filed Vitals:   09/27/11 1445  BP: 110/71  Pulse: 63  Temp:   Resp: 18  BP 110/71  Pulse 63  Temp(Src) 98  F (36.7 C) (Oral)  Resp 18  Ht 5\' 7"  (1.702 m)  Wt 128 lb 6.4 oz (58.242 kg)  BMI 20.11 kg/m2  SpO2 98%  4:28 PM Patient has only had one troponin drawn. I have ordered an additional now and another at 20:00.   Exam:  Gen NAD; Heart RRR, systolic murmur, no m/r/g; Lungs CTAB; Abd soft, NT, no rebound or guarding; Ext 2+ pedal pulses bilaterally, no edema.  11:34 PM Patient resting comfortably in room.   Plan: Exercise stress eco in morning.   Dr. Fonnie Jarvis aware of patient.   MDM  Chest pain protocol.         [place], [place] 09/27/11 [phone]

## 2011-09-27 NOTE — ED Notes (Signed)
To CDU room 4.

## 2011-09-27 NOTE — ED Provider Notes (Signed)
CTA questioned by Dr. Shirlee Latch as the study may be inconclusive given the patient's previous sternotomy for valvular surgery. Study switched to Stress Echo, however, patient already received Metoprolol and cannot have study done until a.m. Discussed with patient and agreed.   Rodena Medin, PA-C 09/27/11 1433

## 2011-09-28 DIAGNOSIS — I059 Rheumatic mitral valve disease, unspecified: Secondary | ICD-10-CM

## 2011-09-28 LAB — TROPONIN I: Troponin I: <0.3 ng/mL (ref <0.30)

## 2011-09-28 NOTE — ED Provider Notes (Signed)
7:37am  Patient is in CDU under observation, chest pain protocol.   This is a shared visit with Dr Ignacia Palma. Patient reports she has had great improvement of her chest pain overnight and can now take deep breaths without much pain.  Reviewed all results with patient.  On exam, pt is A&Ox4, NAD, RRR, no m/r/g, CTAB, abd soft, NT, extremities without edema, distal pulses intact and equal bilaterally.  Plan is for stress echo this morning.  Will continue to follow.    11:38 AM Received a call from Dr Tenny Craw who reports patient had a nondiagnostic study.  Patient exercised for 11 minutes, without pain, but her HR never increased beyond 93.  Pt was given metoprolol yesterday in anticipation of coronary CT, though plans were changed out of concern that her s/p mitral valve replacement would not allow for a decent study.  Dr Ignacia Palma made aware, patient also made aware.  Dr Ignacia Palma to speak with patient's cardiologist Dr Riley Kill.    11:56 AM Per Dr Ignacia Palma, patient may be d/c home with cardiology follow up.  Patient verbalizes understanding and agrees with plan.    Results for orders placed during the hospital encounter of 09/27/11  CBC      Component Value Range   WBC 9.7  4.0 - 10.5 (K/uL)   RBC 4.95  3.87 - 5.11 (MIL/uL)   Hemoglobin 14.8  12.0 - 15.0 (g/dL)   HCT 62.1  30.8 - 65.7 (%)   MCV 85.9  78.0 - 100.0 (fL)   MCH 29.9  26.0 - 34.0 (pg)   MCHC 34.8  30.0 - 36.0 (g/dL)   RDW 84.6  96.2 - 95.2 (%)   Platelets 157  150 - 400 (K/uL)  DIFFERENTIAL      Component Value Range   Neutrophils Relative 78 (*) 43 - 77 (%)   Neutro Abs 7.6  1.7 - 7.7 (K/uL)   Lymphocytes Relative 14  12 - 46 (%)   Lymphs Abs 1.3  0.7 - 4.0 (K/uL)   Monocytes Relative 7  3 - 12 (%)   Monocytes Absolute 0.7  0.1 - 1.0 (K/uL)   Eosinophils Relative 1  0 - 5 (%)   Eosinophils Absolute 0.1  0.0 - 0.7 (K/uL)   Basophils Relative 0  0 - 1 (%)   Basophils Absolute 0.0  0.0 - 0.1 (K/uL)  COMPREHENSIVE METABOLIC PANEL    Component Value Range   Sodium 141  135 - 145 (mEq/L)   Potassium 3.8  3.5 - 5.1 (mEq/L)   Chloride 104  96 - 112 (mEq/L)   CO2 29  19 - 32 (mEq/L)   Glucose, Bld 94  70 - 99 (mg/dL)   BUN 16  6 - 23 (mg/dL)   Creatinine, Ser 8.41  0.50 - 1.10 (mg/dL)   Calcium 9.6  8.4 - 32.4 (mg/dL)   Total Protein 6.8  6.0 - 8.3 (g/dL)   Albumin 3.8  3.5 - 5.2 (g/dL)   AST 24  0 - 37 (U/L)   ALT 17  0 - 35 (U/L)   Alkaline Phosphatase 95  39 - 117 (U/L)   Total Bilirubin 0.4  0.3 - 1.2 (mg/dL)   GFR calc non Af Amer >90  >90 (mL/min)   GFR calc Af Amer >90  >90 (mL/min)  URINALYSIS, ROUTINE W REFLEX MICROSCOPIC      Component Value Range   Color, Urine YELLOW  YELLOW    APPearance CLEAR  CLEAR    Specific  Gravity, Urine 1.016  1.005 - 1.030    pH 8.0  5.0 - 8.0    Glucose, UA NEGATIVE  NEGATIVE (mg/dL)   Hgb urine dipstick NEGATIVE  NEGATIVE    Bilirubin Urine NEGATIVE  NEGATIVE    Ketones, ur NEGATIVE  NEGATIVE (mg/dL)   Protein, ur NEGATIVE  NEGATIVE (mg/dL)   Urobilinogen, UA 0.2  0.0 - 1.0 (mg/dL)   Nitrite NEGATIVE  NEGATIVE    Leukocytes, UA NEGATIVE  NEGATIVE   D-DIMER, QUANTITATIVE      Component Value Range   D-Dimer, Quant 0.31  0.00 - 0.48 (ug/mL-FEU)  POCT I-STAT TROPONIN I      Component Value Range   Troponin i, poc 0.00  0.00 - 0.08 (ng/mL)   Comment 3           POCT I-STAT TROPONIN I      Component Value Range   Troponin i, poc 0.03  0.00 - 0.08 (ng/mL)   Comment 3           POCT I-STAT TROPONIN I      Component Value Range   Troponin i, poc 0.00  0.00 - 0.08 (ng/mL)   Comment 3           TROPONIN I      Component Value Range   Troponin I <0.30  <0.30 (ng/mL)   Dg Chest 2 View  09/27/2011  *RADIOLOGY REPORT*  Clinical Data: Chest pain and weakness  CHEST - 2 VIEW  Comparison: None  Findings: Heart size appears normal.  No pleural effusion or pulmonary edema.  Lungs appear hyperinflated and there are coarsened interstitial markings.  No focal airspace consolidation  identified.  IMPRESSION:  1.  No acute cardiopulmonary abnormalities.  Original Report Authenticated By: Rosealee Albee, M.D.      Rise Patience, [place] 09/28/11 1447

## 2011-09-28 NOTE — ED Notes (Signed)
Assisted pt up to bathroom  

## 2011-09-28 NOTE — Discharge Instructions (Signed)
Please call Dr Rosalyn Charters office today to schedule a close follow up appointment.  If you develop worsening chest pain or shortness of breath, lightheadedness, or nausea, please return immediately to the emergency department.  You may return to the ER at any time for worsening condition or any new symptoms that concern you.   Chest Pain (Nonspecific) It is often hard to give a specific diagnosis for the cause of chest pain. There is always a chance that your pain could be related to something serious, such as a heart attack or a blood clot in the lungs. You need to follow up with your caregiver for further evaluation. CAUSES   Heartburn.   Pneumonia or bronchitis.   Anxiety or stress.   Inflammation around your heart (pericarditis) or lung (pleuritis or pleurisy).   A blood clot in the lung.   A collapsed lung (pneumothorax). It can develop suddenly on its own (spontaneous pneumothorax) or from injury (trauma) to the chest.   Shingles infection (herpes zoster virus).  The chest wall is composed of bones, muscles, and cartilage. Any of these can be the source of the pain.  The bones can be bruised by injury.   The muscles or cartilage can be strained by coughing or overwork.   The cartilage can be affected by inflammation and become sore (costochondritis).  DIAGNOSIS  Lab tests or other studies, such as X-rays, electrocardiography, stress testing, or cardiac imaging, may be needed to find the cause of your pain.  TREATMENT   Treatment depends on what may be causing your chest pain. Treatment may include:   Acid blockers for heartburn.   Anti-inflammatory medicine.   Pain medicine for inflammatory conditions.   Antibiotics if an infection is present.   You may be advised to change lifestyle habits. This includes stopping smoking and avoiding alcohol, caffeine, and chocolate.   You may be advised to keep your head raised (elevated) when sleeping. This reduces the chance of acid  going backward from your stomach into your esophagus.   Most of the time, nonspecific chest pain will improve within 2 to 3 days with rest and mild pain medicine.  HOME CARE INSTRUCTIONS   If antibiotics were prescribed, take your antibiotics as directed. Finish them even if you start to feel better.   For the next few days, avoid physical activities that bring on chest pain. Continue physical activities as directed.   Do not smoke.   Avoid drinking alcohol.   Only take over-the-counter or prescription medicine for pain, discomfort, or fever as directed by your caregiver.   Follow your caregiver's suggestions for further testing if your chest pain does not go away.   Keep any follow-up appointments you made. If you do not go to an appointment, you could develop lasting (chronic) problems with pain. If there is any problem keeping an appointment, you must call to reschedule.  SEEK MEDICAL CARE IF:   You think you are having problems from the medicine you are taking. Read your medicine instructions carefully.   Your chest pain does not go away, even after treatment.   You develop a rash with blisters on your chest.  SEEK IMMEDIATE MEDICAL CARE IF:   You have increased chest pain or pain that spreads to your arm, neck, jaw, back, or abdomen.   You develop shortness of breath, an increasing cough, or you are coughing up blood.   You have severe back or abdominal pain, feel nauseous, or vomit.   You develop  severe weakness, fainting, or chills.   You have a fever.  THIS IS AN EMERGENCY. Do not wait to see if the pain will go away. Get medical help at once. Call your local emergency services (911 in [place] Do not drive yourself to the hospital. MAKE SURE YOU:   Understand these instructions.   Will watch your condition.   Will get help right away if you are not doing well or get worse.  Document Released: 02/21/2005 Document Revised: 05/03/2011 Document Reviewed:  12/18/2007 [facility] Patient Information 2012 [place], [place].

## 2011-09-28 NOTE — Progress Notes (Signed)
*  PRELIMINARY RESULTS* Echocardiogram Echocardiogram Stress Test has been performed.  Glean Salen [facility] 09/28/2011, 9:39 AM

## 2011-09-28 NOTE — ED Notes (Signed)
Pt transported to echo for stress test

## 2011-09-28 NOTE — Progress Notes (Signed)
Observation review is complete. 

## 2011-09-28 NOTE — ED Notes (Signed)
Pt resting comfortable denies any CP

## 2011-09-28 NOTE — ED Notes (Signed)
Pt remain NPO 

## 2011-09-28 NOTE — ED Notes (Signed)
Pt instructed on NPO after 0400

## 2011-10-01 NOTE — ED Provider Notes (Signed)
Medical screening examination/treatment/procedure(s) were performed by non-physician practitioner and as supervising physician I was immediately available for consultation/collaboration.   Carleene Cooper III, MD 10/01/11 1153

## 2011-10-01 NOTE — ED Provider Notes (Signed)
Medical screening examination/treatment/procedure(s) were conducted as a shared visit with non-physician practitioner(s) and myself.  I personally evaluated the patient during the encounter Pt with chest pain whom I had seen yesterday. She had stress testing today, with no chest pain but did not get heart rate over 93.  She is asymptomatic.  Released to followup with her cardiologist in the office.  Carleene Cooper III, MD 10/01/11 [phone]

## 2011-10-03 ENCOUNTER — Encounter: Payer: Self-pay | Admitting: Cardiology

## 2011-10-03 ENCOUNTER — Ambulatory Visit (INDEPENDENT_AMBULATORY_CARE_PROVIDER_SITE_OTHER): Payer: 59 | Admitting: Cardiology

## 2011-10-03 VITALS — BP 110/72 | HR 74 | Ht 67.5 in | Wt 128.1 lb

## 2011-10-03 DIAGNOSIS — R071 Chest pain on breathing: Secondary | ICD-10-CM

## 2011-10-03 DIAGNOSIS — R079 Chest pain, unspecified: Secondary | ICD-10-CM

## 2011-10-03 DIAGNOSIS — Z9889 Other specified postprocedural states: Secondary | ICD-10-CM

## 2011-10-03 NOTE — Patient Instructions (Signed)
Your physician has requested that you have an exercise tolerance test with Dr Stuckey. For further information please visit [email or web address]. Please also follow instruction sheet, as given.  Your physician recommends that you continue on your current medications as directed. Please refer to the Current Medication list given to you today.  

## 2011-10-03 NOTE — Progress Notes (Signed)
HPI:  [name] comes in following a visit to the ER.  She developed an episode of moderate substernal discomfort at rest, and was taken to the ER where she was evaluated.  Her enzymes were negative and she had no acute changes.  Of note, she had some soreness when she took in a breath.  She had been at a class with some weights, but actually less than she does at home.  She has not had an interval symptoms.  Before that she had no exertional symptoms.  See the ER note for details.  During that visit, they had planned on a coronary CTA, but changed to a stress dobutamine echo.  She did then exercise for about eleven minutes without chest pain, but her HR did not come up as she had been given a beta blocker in anticipation of the CTA.  However, no ischemia at eleven minutes was demonstrated.  D-dimer was also negative.    Current Outpatient Prescriptions  Medication Sig Dispense Refill  . aspirin 81 MG tablet Take 81 mg by mouth daily.        Marland Kitchen buPROPion (WELLBUTRIN XL) 150 MG 24 hr tablet Take 1 capsule by mouth Daily.      . calcium citrate-vitamin D (CITRACAL+D) 315-200 MG-UNIT per tablet Take 1 tablet by mouth daily.        . Cholecalciferol (VITAMIN D) 1000 UNITS capsule Take 1,000 Units by mouth daily.        . Coenzyme Q10 (COQ-10) 100 MG CAPS Take 1 tablet by mouth daily.        . Diclofenac Sodium (PENNSAID) 1.5 % SOLN Place 20 drops onto the skin 2 (two) times daily. To right foot for pain and swelling      . Fish Oil OIL 15 mLs by Does not apply route daily.      Marland Kitchen MAGNESIUM CHLORIDE PO Take 1 tablet by mouth daily.        . Multiple Vitamin (MULTIVITAMIN) capsule Take 1 capsule by mouth daily.        . vitamin C (ASCORBIC ACID) 500 MG tablet Take 500 mg by mouth daily.        No Known Allergies  Past Medical History  Diagnosis Date  . Atrial fibrillation   . Asthma   . Osteoporosis   . Mitral valve prolapse     Past Surgical History  Procedure Date  . Tubal ligation      No family history on file.  History   Social History  . Marital Status: Married    Spouse Name: N/A    Number of Children: N/A  . Years of Education: N/A   Occupational History  . Not on file.   Social History Main Topics  . Smoking status: Never Smoker   . Smokeless tobacco: Not on file  . Alcohol Use: No  . Drug Use: No  . Sexually Active: Yes    Birth Control/ Protection: None   Other Topics Concern  . Not on file   Social History Narrative  . No narrative on file    ROS: Please see the HPI.  All other systems reviewed and negative.  PHYSICAL EXAM:  BP 110/72  Pulse 74  Ht 5' 7.5" (1.715 m)  Wt 128 lb 1.9 oz (58.115 kg)  BMI 19.77 kg/m2  General: Well developed, well nourished, in no acute distress. Head:  Normocephalic and atraumatic. Neck: no JVD Lungs: Clear to auscultation and percussion. Heart: Normal S1  and S2.  No murmur, rubs or gallops.  Pulses: Pulses normal in all 4 extremities. Extremities: No clubbing or cyanosis. No edema. Neurologic: Alert and oriented x 3.  EKG: NSR.  Leftward axis.  Incomplete RBBB. Leftward axis. Compared to prior tracings, QRS is slightly wider, but basically unchanged.    ASSESSMENT AND PLAN:

## 2011-10-07 DIAGNOSIS — R071 Chest pain on breathing: Secondary | ICD-10-CM

## 2011-10-07 HISTORY — DX: Chest pain on breathing: R07.1

## 2011-10-07 NOTE — Assessment & Plan Note (Signed)
The pain she had was somewhat atypical.  It was more notable with a breath, and was perhaps slightly sore.  She is not tender.  She exercised for eleven minutes, but the test was nondiagnostic due to suppressed heart rate.  We will get a regular stress test without beta blockade, but her symptoms seem unlikely related to a coronary event.  Also, d-dimer was normal.

## 2011-10-07 NOTE — Assessment & Plan Note (Signed)
Appears stable 

## 2011-10-12 ENCOUNTER — Ambulatory Visit (INDEPENDENT_AMBULATORY_CARE_PROVIDER_SITE_OTHER): Payer: 59 | Admitting: Cardiology

## 2011-10-12 ENCOUNTER — Encounter: Payer: Self-pay | Admitting: Cardiology

## 2011-10-12 DIAGNOSIS — R079 Chest pain, unspecified: Secondary | ICD-10-CM

## 2011-10-12 NOTE — Procedures (Signed)
Exercise Treadmill Test  Pre-Exercise Testing Evaluation Rhythm: normal sinus  Rate: 81   PR:  .20 QRS:  .12  QT:  .40 QTc: .46     Test  Exercise Tolerance Test Ordering MD: Shawnie Pons, MD  Interpreting MD: Shawnie Pons, MD  Unique Test No: 1  Treadmill:  1  Indication for ETT: chest pain - rule out ischemia  Contraindication to ETT: No   Stress Modality: exercise - treadmill  Cardiac Imaging Performed: non   Protocol: standard Bruce - maximal  Max BP:  169/83  Max MPHR (bpm):  164 85% MPR (bpm):  141  MPHR obtained (bpm):  160 % MPHR obtained:  97%  Reached 85% MPHR (min:sec):  9:20 Total Exercise Time (min-sec):  12:00  Workload in METS:  13.4 Borg Scale: 18  Reason ETT Terminated:  desired heart rate attained    ST Segment Analysis At Rest: normal ST segments - no evidence of significant ST depression With Exercise: no evidence of significant ST depression  Other Information Arrhythmia:  No Angina during ETT:  absent (0) Quality of ETT:  diagnostic  ETT Interpretation:  normal - no evidence of ischemia by ST analysis  Comments: She exercised today on the Bruce protocol for 12 minutes.  She had no chest pain or shortness of breath.  BP response was normal, exercise tolerance was excellent, and there were no ST segment changes.    Recommendations: Observation

## 2011-12-14 ENCOUNTER — Ambulatory Visit: Payer: 59 | Admitting: Cardiology

## 2012-02-20 ENCOUNTER — Encounter: Payer: Self-pay | Admitting: Gynecology

## 2012-03-05 ENCOUNTER — Encounter: Payer: 59 | Admitting: Women's Health

## 2012-03-10 ENCOUNTER — Encounter: Payer: Self-pay | Admitting: Women's Health

## 2012-03-10 ENCOUNTER — Ambulatory Visit (INDEPENDENT_AMBULATORY_CARE_PROVIDER_SITE_OTHER): Payer: 59 | Admitting: Women's Health

## 2012-03-10 ENCOUNTER — Other Ambulatory Visit (HOSPITAL_COMMUNITY)
Admission: RE | Admit: 2012-03-10 | Discharge: 2012-03-10 | Disposition: A | Discharge status: Home / Self Care | Payer: 59 | Source: Ambulatory Visit | Attending: Women's Health | Admitting: Women's Health

## 2012-03-10 VITALS — BP 120/80 | Ht 67.0 in | Wt 127.0 lb

## 2012-03-10 DIAGNOSIS — Z01419 Encounter for gynecological examination (general) (routine) without abnormal findings: Secondary | ICD-10-CM

## 2012-03-10 DIAGNOSIS — Z1151 Encounter for screening for human papillomavirus (HPV): Secondary | ICD-10-CM | POA: Insufficient documentation

## 2012-03-10 NOTE — Patient Instructions (Signed)
Vit d 2000 daily  Dr [facility]   Colonoscopy  Health Recommendations for Postmenopausal Women Based on the Results of the Women's Health Initiative [facility]) and Other Studies The WHI is a major 15-year research program to address the most common causes of death, disability and poor quality of life in postmenopausal women. Some of these causes are heart disease, cancer, bone loss (osteoporosis) and others. Taking into account all of the findings from [facility] and other studies, here are bottom-line health recommendations for women: CARDIOVASCULAR DISEASE Heart Disease: A heart attack is a medical emergency. Know the signs and symptoms of a heart attack. Hormone therapy should not be used to prevent heart disease. In women with heart disease, hormone therapy should not be used to prevent further disease. Hormone therapy increases the risk of blood clots. Below are things women can do to reduce their risk for heart disease.   Do not smoke. If you smoke, quit. Women who smoke are 2 to 6 times more likely to suffer a heart attack than non-smoking women.  Aim for a healthy weight. Being overweight causes many preventable deaths. Eat a healthy and balanced diet and drink an adequate amount of liquids.  Get moving. Make a commitment to be more physically active. Aim for 30 minutes of activity on most, if not all days of the week.  Eat for heart health. Choose a diet that is low in saturated fat, trans fat, and cholesterol. Include whole grains, vegetables, and fruits. Read the labels on the food container before buying it.  Know your numbers. Ask your caregiver to check your blood pressure, cholesterol (total, HDL, LDL, triglycerides) and blood glucose. Work with your caregiver to improve any numbers that are not normal.  High blood pressure. Limit or stop your table salt intake (try salt substitute and food seasonings), avoid salty foods and drinks. Read the labels on the food container before buying it. Avoid  becoming overweight by eating well and exercising. STROKE  Stroke is a medical emergency. Stroke can be the result of a blood clot in the blood vessel in the brain or by a brain hemorrhage (bleeding). Know the signs and symptoms of a stroke. To lower the risk of developing a stroke:  Avoid fatty foods.  Quit smoking.  Control your diabetes, blood pressure, and irregular heart rate. THROMBOPHLIBITIS (BLOOD CLOT) OF THE LEG  Hormone treatment is a big cause of developing blood clots in the leg. Becoming overweight and leading a stationary lifestyle also may contribute to developing blood clots. Controlling your diet and exercising will help lower the risk of developing blood clots. CANCER SCREENING  Breast Cancer: Women should take steps to reduce their risk of breast cancer. This includes having regular mammograms, monthly self breast exams and regular breast exams by your caregiver. Have a mammogram every one to two years if you are 57 to 57 years old. Have a mammogram annually if you are 65 years old or older depending on your risk factors. Women who are high risk for breast cancer may need more frequent mammograms. There are tests available (testing the genes in your body) if you have family history of breast cancer called BRCA 1 and 2. These tests can help determine the risks of developing breast cancer.  Intestinal or Stomach Cancer: Women should talk to their caregiver about when to start screening, what tests and how often they should be done, and the benefits and risks of doing these tests. Tests to consider are a rectal exam,  fecal occult blood, sigmoidoscopy, colononoscoby, barium enema and upper GI series of the stomach. Depending on the age, you may want to get a medical and family history of colon cancer. Women who are high risk may need to be screened at an earlier age and more often.  Cervical Cancer: A Pap test of the cervix should be done every year and every 3 years when there has  been three straight years of a normal Pap test. Women with an abnormal Pap test should be screened more often or have a cervical biopsy depending on your caregiver's recommendation.  Uterine Cancer: If you have vaginal bleeding after you are in the menopause, it should be evaluated by your caregiver.  Ovarian cancer: There are no reliable tests available to screen for ovarian cancer at this time except for yearly pelvic exams.  Lung Cancer: Yearly chest X-rays can detect lung cancer and should be done on high risk women, such as cigarette smokers and women with chronic lung disease (emphysemia).  Skin Cancer: A complete body skin exam should be done at your yearly examination. Avoid overexposure to the sun and ultraviolet light lamps. Use a strong sun block cream when in the sun. All of these things are important in lowering the risk of skin cancer. MENOPAUSE Menopause Symptoms: Hormone therapy products are effective for treating symptoms associated with menopause:  Moderate to severe hot flashes.  Night sweats.  Mood swings.  Headaches.  Tiredness.  Loss of sex drive.  Insomnia.  Other symptoms. However, hormone therapy products carry serious risks, especially in older women. Women who use or are thinking about using estrogen or estrogen with progestin treatments should discuss that with their caregiver. Your caregiver will know if the benefits outweigh the risks. The Food and Drug Administration (FDA) has concluded that hormone therapy should be used only at the lowest doses and for the shortest amount of time to reach treatment goals. It is not known at what doses there may be less risk of serious side effects. There are other treatments such as herbal medication (not controlled or regulated by the FDA), group therapy, counseling and acupuncture that may be helpful. OSTEOPOROSIS Protecting Against Bone Loss and Preventing Fracture: If hormone therapy is used for prevention of bone  loss (osteoporosis), the risks for bone loss must outweigh the risk of the therapy. Women considering taking hormone therapy for bone loss should ask their health care providers about other medications (fosamax and boniva) that are considered safe and effective for preventing bone loss and bone fractures. To guard against bone loss or fractures, it is recommended that women should take at least 1000-1500 mg of calcium and 400-800 IU of vitamin D daily in divided doses. Smoking and excessive alcohol intake increases the risk of osteoporosis. Eat foods rich in calcium and vitamin D and do weight bearing exercises several times a week as your caregiver suggests. DIABETES Diabetes Melitus: Women with Type I or Type 2 diabetes should keep their diabetes in control with diet, exercise and medication. Avoid too many sweets, starchy and fatty foods. Being overweight can affect your diabetes. COGNITION AND MEMORY Cognition and Memory: Menopausal hormone therapy is not recommended for the prevention of cognitive disorders such as Alzheimer's disease or memory loss. WHI found that women treated with hormone therapy have a greater risk of developing dementia.  DEPRESSION  Depression may occur at any age, but is common in elderly women. The reasons may be because of physical, medical, social (loneliness), financial and/or economic  problems and needs. Becoming involved with church, volunteer or social groups, seeking treatment for any physical or medical problems is recommended. Also, look into getting professional advice for any economic or financial problems. ACCIDENTS  Accidents are common and can be serious in the elderly woman. Prepare your house to prevent accidents. Eliminate throw rugs, use hip protectors, place hand bars in the bath, shower and toilet areas. Avoid wearing high heel shoes and walking on wet, snowy and icy areas. Stop driving if you have vision, hearing problems or are unsteady with you movements  and reflexes. RHEUMATOID ARTHRITIS Rheumatoid arthritis causes pain, swelling and stiffness of your bone joints. It can limit many of your activities. Over-the-counter medications may help, but prescription medications may be necessary. Talk with your caregiver about this. Exercise (walking, water aerobics), good posture, using splints on painful joints, warm baths or applying warm compresses to stiff joints and cold compresses to painful joints may be helpful. Smoking and excessive drinking may worsen the symptoms of arthritis. Seek help from a physical therapist if the arthritis is becoming a problem with your daily activities. IMMUNIZATIONS  Several immunizations are important to have during your senior years, including:   Tetanus and a diptheria shot booster every 10 years.  Influenza every year before the flu season begins.  Pneumonia vaccine.  Shingles vaccine.  Others as indicated (example: H1N1 vaccine). Document Released: 07/06/2005 Document Revised: 08/06/2011 Document Reviewed: 03/01/2008 [facility] Patient Information 2013 [place], [place].

## 2012-03-10 NOTE — Progress Notes (Signed)
Martha Clark 1956/03/02 [id number]    History:    The patient presents for annual exam.  Postmenopausal with no bleeding/no HRT. History of  mitral valve replacement. Osteoporosis treated by primary care with Fosamax 70 since 2008. DEXA 2010 T score -1.7 at the hip, -2.6 at radius. Colonoscopy 2007 diverticulosis noted will schedule for a repeat this year as recommended.  History of normal Paps. Mammogram normal with additional ultrasound and diagnostic mammogram in 2011, normal September 2013   Past medical history, past surgical history, family history and social history were all reviewed and documented in the EPIC   Works for [employer], son Sharia Reeve 35, daughter (adopted) 106 both doing well.   ROS:  A  ROS was performed and pertinent positives and negatives are included in the history.  Exam:  Filed Vitals:   03/10/12 1113  BP: 120/80    General appearance:  Normal Head/Neck:  Normal, without cervical or supraclavicular adenopathy. Thyroid:  Symmetrical, normal in size, without palpable masses or nodularity. Respiratory  Effort:  Normal  Auscultation:  Clear without wheezing or rhonchi Cardiovascular  Auscultation:  Regular rate, without rubs, murmurs or gallops  Edema/varicosities:  Not grossly evident Abdominal  Soft,nontender, without masses, guarding or rebound.  Liver/spleen:  No organomegaly noted  Hernia:  None appreciated  Skin  Inspection:  Grossly normal  Palpation:  Grossly normal Neurologic/psychiatric  Orientation:  Normal with appropriate conversation.  Mood/affect:  Normal  Genitourinary    Breasts: Examined lying and sitting.     Right: Without masses, retractions, discharge or axillary adenopathy.     Left: Without masses, retractions, discharge or axillary adenopathy.   Inguinal/mons:  Normal without inguinal adenopathy  External genitalia:  Normal  BUS/Urethra/Skene's glands:  Normal  Bladder:  Normal  Vagina:  Normal  Cervix:  Normal  Uterus:   normal  in size, shape and contour.  Midline and mobile  Adnexa/parametria:     Rt: Without masses or tenderness.   Lt: Without masses or tenderness.  Anus and perineum: Normal  Digital rectal exam: Normal sphincter tone without palpated masses or tenderness  Assessment/Plan:  56 y.o. M. WF G2 P1 plus 1 adopted. for annual exam with no complaints.  Osteoporosis on Fosamax 70  per primary care since 2008 Diverticulosis Asthma/allergies  Plan: Continue labs her primary care, schedule DEXA, last DEXA December 2010 at primary care. SBE's, continue annual mammograms, continue regular exercise, healthy diet, vitamin D 2000 daily encouraged. Colonoscopy due instructed to schedule. Last Pap normal 2011, Pap today with HPV typing.    Harrington Challenger WHNP, 1:15 PM 03/10/2012

## 2012-04-22 ENCOUNTER — Ambulatory Visit: Payer: 59 | Admitting: Cardiology

## 2013-03-11 ENCOUNTER — Encounter: Payer: 59 | Admitting: Women's Health

## 2013-03-12 ENCOUNTER — Ambulatory Visit (INDEPENDENT_AMBULATORY_CARE_PROVIDER_SITE_OTHER): Payer: 59 | Admitting: Women's Health

## 2013-03-12 ENCOUNTER — Encounter: Payer: Self-pay | Admitting: Women's Health

## 2013-03-12 VITALS — BP 108/64 | Ht 67.0 in | Wt 122.0 lb

## 2013-03-12 DIAGNOSIS — Z01419 Encounter for gynecological examination (general) (routine) without abnormal findings: Secondary | ICD-10-CM

## 2013-03-12 NOTE — Progress Notes (Signed)
Martha Clark [birth year] [id number]    History:    The patient presents for annual exam.  Postmenopausal on no HRT with no bleeding. Normal Pap and mammogram history. DEXA 2013 T score -2.2 left hip managed through primary care. 2007 colonoscopy noted diverticulosis no polyps. 2008 mitral valve replaced.   Past medical history, past surgical history, family history and social history were all reviewed and documented in the EPIC chart. 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 Florie 15 (adopted) struggling with ADHD, anxiety and depression. Father diabetes/stroke. Maternal grandmother breast cancer at 3.  ROS:  A  ROS was performed and pertinent positives and negatives are included in the history.  Exam:  Filed Vitals:   03/12/13 1511  BP: 108/64    General appearance:  Normal Head/Neck:  Normal, without cervical or supraclavicular adenopathy. Thyroid:  Symmetrical, normal in size, without palpable masses or nodularity. Respiratory  Effort:  Normal  Auscultation:  Clear without wheezing or rhonchi Cardiovascular  Auscultation:  Regular rate, without rubs, murmurs or gallops  Edema/varicosities:  Not grossly evident Abdominal  Soft,nontender, without masses, guarding or rebound.  Liver/spleen:  No organomegaly noted  Hernia:  None appreciated  Skin  Inspection:  Grossly normal  Palpation:  Grossly normal Neurologic/psychiatric  Orientation:  Normal with appropriate conversation.  Mood/affect:  Normal  Genitourinary    Breasts: Examined lying and sitting.     Right: Without masses, retractions, discharge or axillary adenopathy.     Left: Without masses, retractions, discharge or axillary adenopathy.   Inguinal/mons:  Normal without inguinal adenopathy  External genitalia:  Normal  BUS/Urethra/Skene's glands:  Normal  Bladder:  Normal  Vagina:  Normal  Cervix:  Normal  Uterus: normal in size, shape and contour.  Midline and  mobile  Adnexa/parametria:     Rt: Without masses or tenderness.   Lt: Without masses or tenderness.  Anus and perineum: Normal  Digital rectal exam: Normal sphincter tone without palpated masses or tenderness  Assessment/Plan:  57 y.o. MWF G1P1 + adopted daughter for annual exam.     Postmenopausal /no HRT/no bleeding Osteopenia. managed through primary care Labs primary care  Plan: SBE's, continue annual mammogram, three-day tomography reviewed and encouraged history of dense breast. Continue regular exercise, calcium rich diet, vitamin D 2000 daily encouraged. Home safety and fall prevention discussed. Pap normal 2013, new screening guidelines reviewed.    Harrington Challenger [facility], 4:48 PM 03/12/2013

## 2013-03-12 NOTE — Patient Instructions (Signed)
Health Recommendations for Postmenopausal Women Respected and ongoing research has looked at the most common causes of death, disability, and poor quality of life in postmenopausal women. The causes include heart disease, diseases of blood vessels, diabetes, depression, cancer, and bone loss (osteoporosis). Many things can be done to help lower the chances of developing these and other common problems: CARDIOVASCULAR DISEASE Heart Disease: A heart attack is a medical emergency. Know the signs and symptoms of a heart attack. Below are things women can do to reduce their risk for heart disease.   Do not smoke. If you smoke, quit.  Aim for a healthy weight. Being overweight causes many preventable deaths. Eat a healthy and balanced diet and drink an adequate amount of liquids.  Get moving. Make a commitment to be more physically active. Aim for 30 minutes of activity on most, if not all days of the week.  Eat for heart health. Choose a diet that is low in saturated fat and cholesterol and eliminate trans fat. Include whole grains, vegetables, and fruits. Read and understand the labels on food containers before buying.  Know your numbers. Ask your caregiver to check your blood pressure, cholesterol (total, HDL, LDL, triglycerides) and blood glucose. Work with your caregiver on improving your entire clinical picture.  High blood pressure. Limit or stop your table salt intake (try salt substitute and food seasonings). Avoid salty foods and drinks. Read labels on food containers before buying. Eating well and exercising can help control high blood pressure. STROKE  Stroke is a medical emergency. Stroke may be the result of a blood clot in a blood vessel in the brain or by a brain hemorrhage (bleeding). Know the signs and symptoms of a stroke. To lower the risk of developing a stroke:  Avoid fatty foods.  Quit smoking.  Control your diabetes, blood pressure, and irregular heart rate. THROMBOPHLEBITIS  (BLOOD CLOT) OF THE LEG  Becoming overweight and leading a stationary lifestyle may also contribute to developing blood clots. Controlling your diet and exercising will help lower the risk of developing blood clots. CANCER SCREENING  Breast Cancer: Take steps to reduce your risk of breast cancer.  You should practice "breast self-awareness." This means understanding the normal appearance and feel of your breasts and should include breast self-examination. Any changes detected, no matter how small, should be reported to your caregiver.  After age 40, you should have a clinical breast exam (CBE) every year.  Starting at age 40, you should consider having a mammogram (breast X-ray) every year.  If you have a family history of breast cancer, talk to your caregiver about genetic screening.  If you are at high risk for breast cancer, talk to your caregiver about having an MRI and a mammogram every year.  Intestinal or Stomach Cancer: Tests to consider are a rectal exam, fecal occult blood, sigmoidoscopy, and colonoscopy. Women who are high risk may need to be screened at an earlier age and more often.  Cervical Cancer:  Beginning at age 30, you should have a Pap test every 3 years as long as the past 3 Pap tests have been normal.  If you have had past treatment for cervical cancer or a condition that could lead to cancer, you need Pap tests and screening for cancer for at least 20 years after your treatment.  If you had a hysterectomy for a problem that was not cancer or a condition that could lead to cancer, then you no longer need Pap tests.    If you are between ages 65 and 70, and you have had normal Pap tests going back 10 years, you no longer need Pap tests.  If Pap tests have been discontinued, risk factors (such as a new sexual partner) need to be reassessed to determine if screening should be resumed.  Some medical problems can increase the chance of getting cervical cancer. In these  cases, your caregiver may recommend more frequent screening and Pap tests.  Uterine Cancer: If you have vaginal bleeding after reaching menopause, you should notify your caregiver.  Ovarian cancer: Other than yearly pelvic exams, there are no reliable tests available to screen for ovarian cancer at this time except for yearly pelvic exams.  Lung Cancer: Yearly chest X-rays can detect lung cancer and should be done on high risk women, such as cigarette smokers and women with chronic lung disease (emphysema).  Skin Cancer: A complete body skin exam should be done at your yearly examination. Avoid overexposure to the sun and ultraviolet light lamps. Use a strong sun block cream when in the sun. All of these things are important in lowering the risk of skin cancer. MENOPAUSE Menopause Symptoms: Hormone therapy products are effective for treating symptoms associated with menopause:  Moderate to severe hot flashes.  Night sweats.  Mood swings.  Headaches.  Tiredness.  Loss of sex drive.  Insomnia.  Other symptoms. Hormone replacement carries certain risks, especially in older women. Women who use or are thinking about using estrogen or estrogen with progestin treatments should discuss that with their caregiver. Your caregiver will help you understand the benefits and risks. The ideal dose of hormone replacement therapy is not known. The Food and Drug Administration (FDA) has concluded that hormone therapy should be used only at the lowest doses and for the shortest amount of time to reach treatment goals.  OSTEOPOROSIS Protecting Against Bone Loss and Preventing Fracture: If you use hormone therapy for prevention of bone loss (osteoporosis), the risks for bone loss must outweigh the risk of the therapy. Ask your caregiver about other medications known to be safe and effective for preventing bone loss and fractures. To guard against bone loss or fractures, the following is recommended:  If  you are less than age 50, take 1000 mg of calcium and at least 600 mg of Vitamin D per day.  If you are greater than age 50 but less than age 70, take 1200 mg of calcium and at least 600 mg of Vitamin D per day.  If you are greater than age 70, take 1200 mg of calcium and at least 800 mg of Vitamin D per day. Smoking and excessive alcohol intake increases the risk of osteoporosis. Eat foods rich in calcium and vitamin D and do weight bearing exercises several times a week as your caregiver suggests. DIABETES Diabetes Melitus: If you have Type I or Type 2 diabetes, you should keep your blood sugar under control with diet, exercise and recommended medication. Avoid too many sweets, starchy and fatty foods. Being overweight can make control more difficult. COGNITION AND MEMORY Cognition and Memory: Menopausal hormone therapy is not recommended for the prevention of cognitive disorders such as Alzheimer's disease or memory loss.  DEPRESSION  Depression may occur at any age, but is common in elderly women. The reasons may be because of physical, medical, social (loneliness), or financial problems and needs. If you are experiencing depression because of medical problems and control of symptoms, talk to your caregiver about this. Physical activity and   exercise may help with mood and sleep. Community and volunteer involvement may help your sense of value and worth. If you have depression and you feel that the problem is getting worse or becoming severe, talk to your caregiver about treatment options that are best for you. ACCIDENTS  Accidents are common and can be serious in the elderly woman. Prepare your house to prevent accidents. Eliminate throw rugs, place hand bars in the bath, shower and toilet areas. Avoid wearing high heeled shoes or walking on wet, snowy, and icy areas. Limit or stop driving if you have vision or hearing problems, or you feel you are unsteady with you movements and  reflexes. HEPATITIS C Hepatitis C is a type of viral infection affecting the liver. It is spread mainly through contact with blood from an infected person. It can be treated, but if left untreated, it can lead to severe liver damage over years. Many people who are infected do not know that the virus is in their blood. If you are a "baby-boomer", it is recommended that you have one screening test for Hepatitis C. IMMUNIZATIONS  Several immunizations are important to consider having during your senior years, including:   Tetanus, diptheria, and pertussis booster shot.  Influenza every year before the flu season begins.  Pneumonia vaccine.  Shingles vaccine.  Others as indicated based on your specific needs. Talk to your caregiver about these. Document Released: 07/06/2005 Document Revised: 04/30/2012 Document Reviewed: 03/01/2008 [employer] Patient Information 2014 [employer].  

## 2013-11-16 IMAGING — CR DG CHEST 2V
2 series · 2 of 2 positions shown · non-contrast
Comparison: None

CLINICAL DATA: Chest pain and weakness

CHEST - 2 VIEW

[w chest pa]
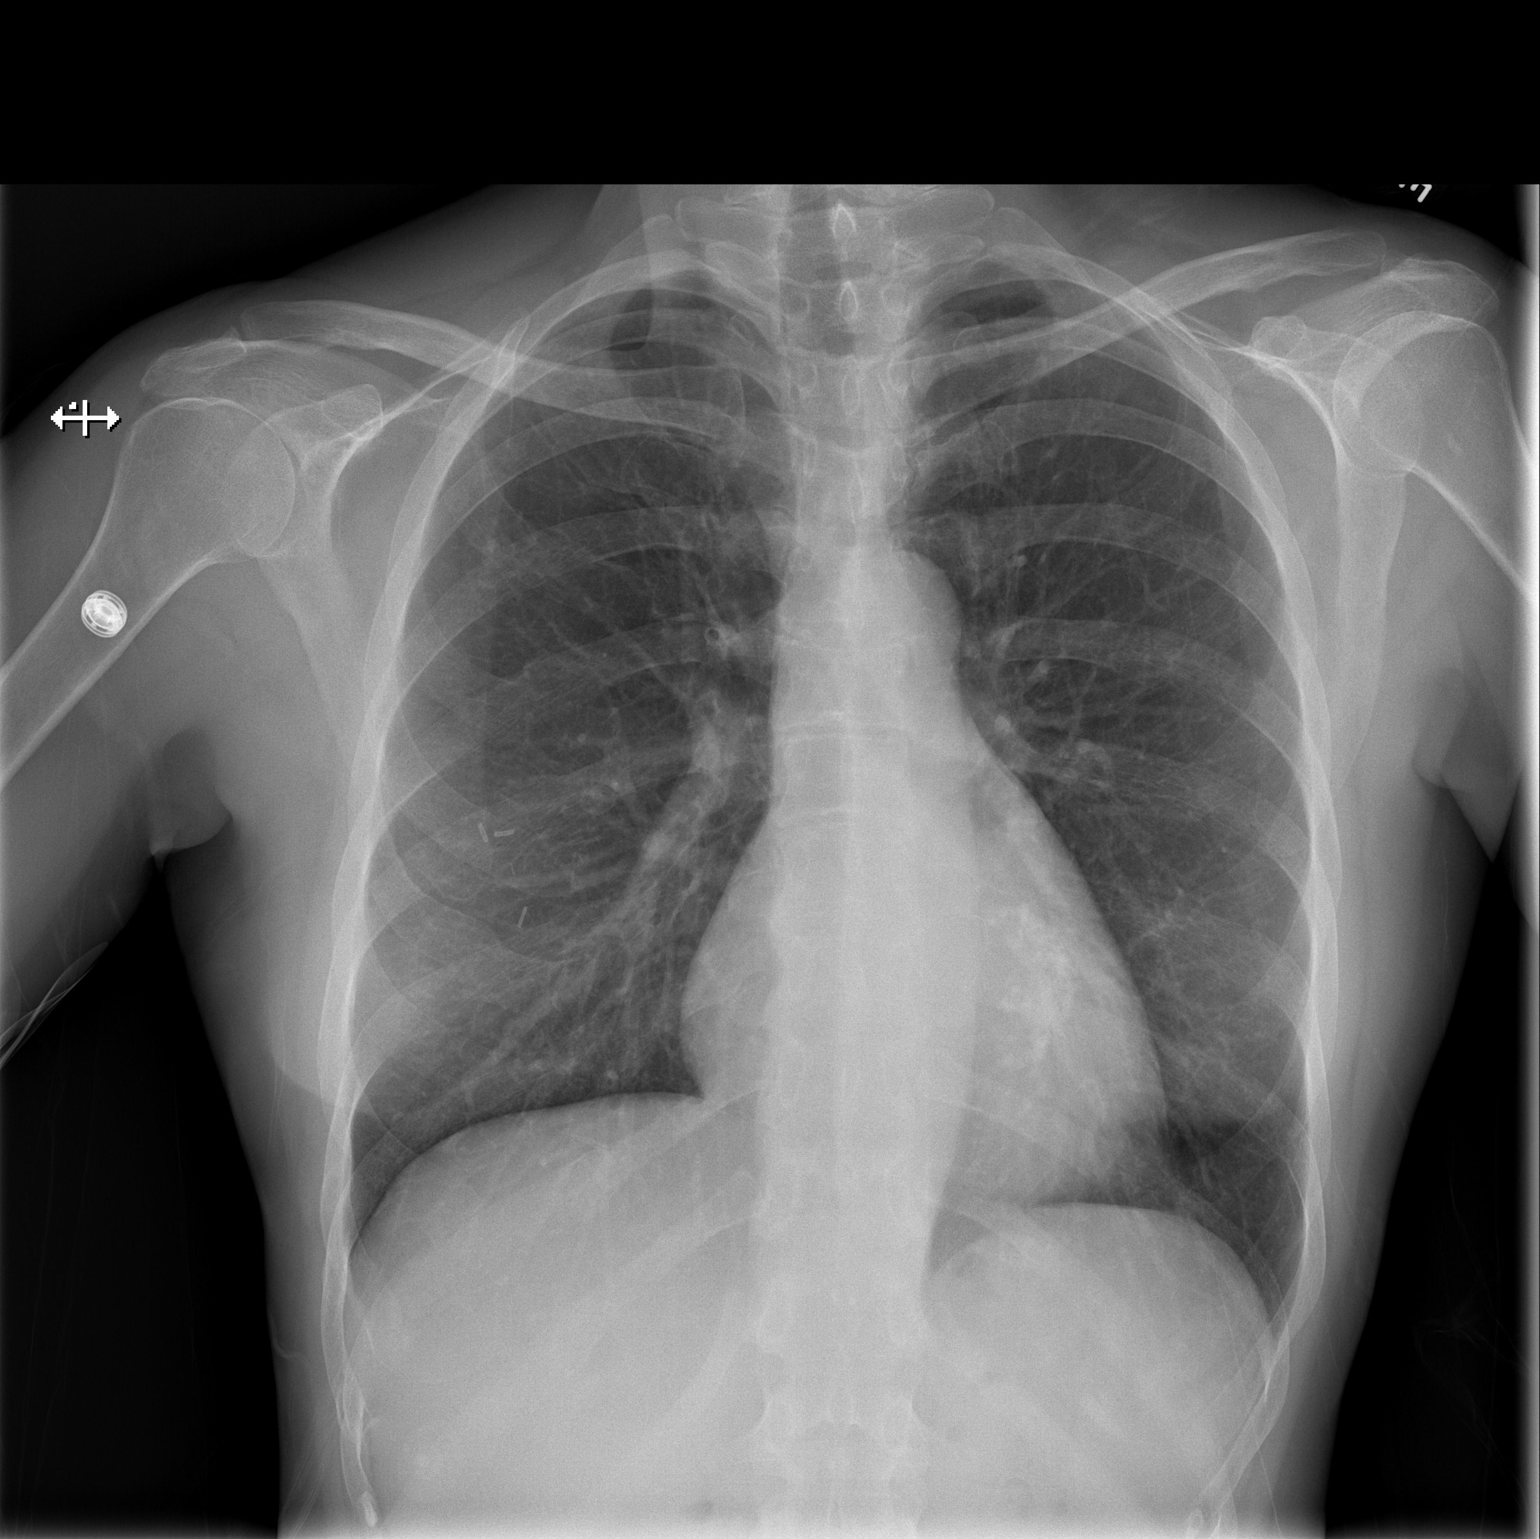

[w chest lat]
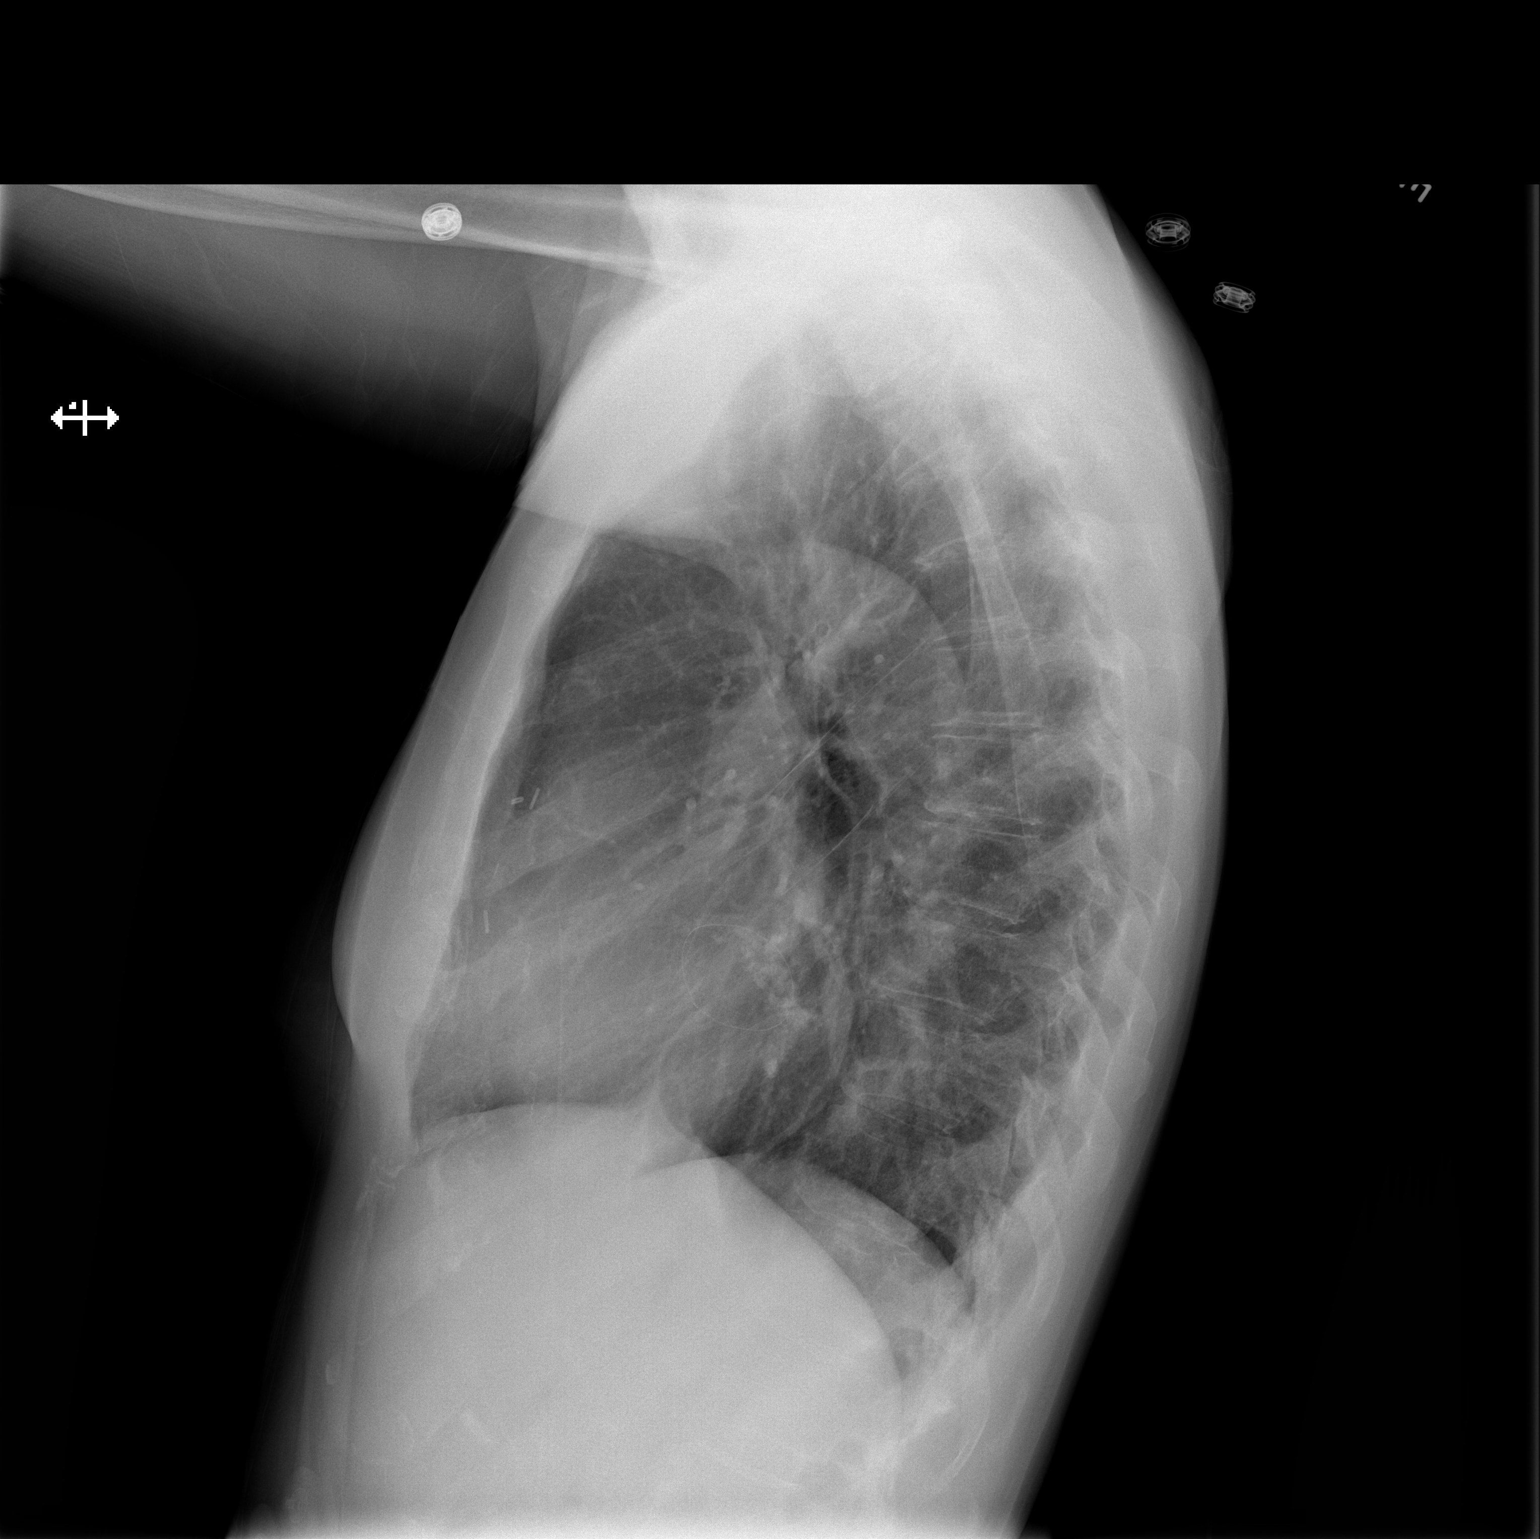

[2 of 2 positions shown; findings below may reference images not displayed]

FINDINGS: Heart size appears normal.

No pleural effusion or pulmonary edema.

Lungs appear hyperinflated and there are coarsened interstitial
markings.

No focal airspace consolidation identified.
IMPRESSION: 1.  No acute cardiopulmonary abnormalities.

## 2014-02-12 ENCOUNTER — Encounter: Payer: Self-pay | Admitting: Women's Health

## 2014-03-23 ENCOUNTER — Encounter: Payer: 59 | Admitting: Women's Health

## 2014-03-29 ENCOUNTER — Encounter: Payer: Self-pay | Admitting: Women's Health

## 2014-04-13 ENCOUNTER — Encounter: Payer: 59 | Admitting: Women's Health

## 2014-05-04 ENCOUNTER — Encounter: Payer: Self-pay | Admitting: Women's Health

## 2014-05-04 ENCOUNTER — Other Ambulatory Visit (HOSPITAL_COMMUNITY)
Admission: RE | Admit: 2014-05-04 | Discharge: 2014-05-04 | Disposition: A | Discharge status: Home / Self Care | Payer: BC Managed Care – PPO | Source: Ambulatory Visit | Attending: Women's Health | Admitting: Women's Health

## 2014-05-04 ENCOUNTER — Ambulatory Visit (INDEPENDENT_AMBULATORY_CARE_PROVIDER_SITE_OTHER): Payer: BC Managed Care – PPO | Admitting: Women's Health

## 2014-05-04 VITALS — Ht 67.0 in | Wt 119.4 lb

## 2014-05-04 DIAGNOSIS — Z01419 Encounter for gynecological examination (general) (routine) without abnormal findings: Secondary | ICD-10-CM | POA: Diagnosis present

## 2014-05-04 DIAGNOSIS — Z1151 Encounter for screening for human papillomavirus (HPV): Secondary | ICD-10-CM | POA: Insufficient documentation

## 2014-05-04 NOTE — Patient Instructions (Signed)

## 2014-05-04 NOTE — Addendum Note (Signed)
Addended by: Aura CampsWEBB, Jamus Loving L on: 05/04/2014 12:20 PM   Modules accepted: Orders, SmartSet

## 2014-05-04 NOTE — Progress Notes (Signed)
Martha Clark 09/02/1955 [id number]    History:    Presents for annual exam.  Postmenopausal/no bleeding/no HRT. Normal Pap and mammogram history. 2013 T score -2.2 at left hip at primary care has follow-up scheduled. 2007 colonoscopy diverticulosis no polyps. 2008 mitral valve replaced. Recent right ankle injury from tramatic fall while Christmas decorating. Reports labs as normal, vitamin D level normal.  Past medical history, past surgical history, family history and social history were all reviewed and documented in the EPIC chart. [facility] at [facility], has to travel. 2 children, Josh 19 playing [place] [facility] [employer] [place], [facility] 16 adopted doing well. Father diabetes and stroke.  ROS:  A  12 point ROS was performed and pertinent positives and negatives are included.  Exam:  There were no vitals filed for this visit.  General appearance:  Normal Thyroid:  Symmetrical, normal in size, without palpable masses or nodularity. Respiratory  Auscultation:  Clear without wheezing or rhonchi Cardiovascular  Auscultation:  Regular rate, without rubs, murmurs or gallops  Edema/varicosities:  Not grossly evident Abdominal  Soft,nontender, without masses, guarding or rebound.  Liver/spleen:  No organomegaly noted  Hernia:  None appreciated  Skin  Inspection:  Grossly normal   Breasts: Examined lying and sitting.     Right: Without masses, retractions, discharge or axillary adenopathy.     Left: Without masses, retractions, discharge or axillary adenopathy. Gentitourinary   Inguinal/mons:  Normal without inguinal adenopathy  External genitalia:  Normal  BUS/Urethra/Skene's glands:  Normal  Vagina:  Atrophic  Cervix:  Normal  Uterus:  normal in size, shape and contour.  Midline and mobile  Adnexa/parametria:     Rt: Without masses or tenderness.   Lt: Without masses or tenderness.  Anus and perineum: Normal  Digital rectal exam: Normal sphincter tone without palpated masses or  tenderness  Assessment/Plan:  58 y.o. MWF G1P1 +1 adopted for annual exam with no complaints.     Postmenopausal/no HRT/no bleeding Atrophic vaginitis Osteopenia primary care manages 2008 mitral valve replaced cardiologist manages  Plan: SBE's, continue annual mammogram, calcium rich diet, vitamin D 2000. Vaginal lubricants with intercourse. Labs at primary care. Keep scheduled repeat colonoscopy 2016. Home safety, fall prevention discussed. UA, Pap with HR HPV typing, new screening guidelines reviewed.    Harrington ChallengerYOUNG,Veronica Guerrant J WHNP, 12:02 PM 05/04/2014

## 2014-05-06 LAB — CYTOLOGY - PAP

## 2014-11-16 ENCOUNTER — Emergency Department (HOSPITAL_COMMUNITY): Payer: BLUE CROSS/BLUE SHIELD

## 2014-11-16 ENCOUNTER — Encounter (HOSPITAL_COMMUNITY): Payer: Self-pay | Admitting: Emergency Medicine

## 2014-11-16 ENCOUNTER — Emergency Department (HOSPITAL_COMMUNITY)
Admission: EM | Admit: 2014-11-16 | Discharge: 2014-11-16 | Disposition: A | Discharge status: Home / Self Care | Payer: BLUE CROSS/BLUE SHIELD | Attending: Emergency Medicine | Admitting: Emergency Medicine

## 2014-11-16 DIAGNOSIS — Z7982 Long term (current) use of aspirin: Secondary | ICD-10-CM | POA: Insufficient documentation

## 2014-11-16 DIAGNOSIS — Z79899 Other long term (current) drug therapy: Secondary | ICD-10-CM | POA: Diagnosis not present

## 2014-11-16 DIAGNOSIS — I499 Cardiac arrhythmia, unspecified: Secondary | ICD-10-CM | POA: Insufficient documentation

## 2014-11-16 DIAGNOSIS — I4892 Unspecified atrial flutter: Secondary | ICD-10-CM

## 2014-11-16 DIAGNOSIS — Z8739 Personal history of other diseases of the musculoskeletal system and connective tissue: Secondary | ICD-10-CM | POA: Insufficient documentation

## 2014-11-16 DIAGNOSIS — J45909 Unspecified asthma, uncomplicated: Secondary | ICD-10-CM | POA: Diagnosis not present

## 2014-11-16 DIAGNOSIS — R079 Chest pain, unspecified: Secondary | ICD-10-CM | POA: Diagnosis present

## 2014-11-16 HISTORY — DX: Unspecified atrial flutter: I48.92

## 2014-11-16 LAB — CBC
HCT: 41.6 % (ref 36.0–46.0)
Hemoglobin: 14 g/dL (ref 12.0–15.0)
MCH: 29.3 pg (ref 26.0–34.0)
MCHC: 33.7 g/dL (ref 30.0–36.0)
MCV: 87 fL (ref 78.0–100.0)
Platelets: 169 10*3/uL (ref 150–400)
RBC: 4.78 MIL/uL (ref 3.87–5.11)
RDW: 12.8 % (ref 11.5–15.5)
WBC: 9.6 10*3/uL (ref 4.0–10.5)

## 2014-11-16 LAB — BASIC METABOLIC PANEL
Anion gap: 8 (ref 5–15)
BUN: 16 mg/dL (ref 6–20)
CO2: 30 mmol/L (ref 22–32)
Calcium: 8.8 mg/dL — ABNORMAL LOW (ref 8.9–10.3)
Chloride: 102 mmol/L (ref 101–111)
Creatinine, Ser: 0.87 mg/dL (ref 0.44–1.00)
GFR calc Af Amer: >60 mL/min (ref >60)
GFR calc non Af Amer: >60 mL/min (ref >60)
Glucose, Bld: 117 mg/dL — ABNORMAL HIGH (ref 65–99)
Potassium: 3.9 mmol/L (ref 3.5–5.1)
Sodium: 140 mmol/L (ref 135–145)

## 2014-11-16 LAB — I-STAT TROPONIN, ED: Troponin i, poc: 0 ng/mL (ref 0.00–0.08)

## 2014-11-16 LAB — BRAIN NATRIURETIC PEPTIDE: B Natriuretic Peptide: 263.3 pg/mL — ABNORMAL HIGH (ref 0.0–100.0)

## 2014-11-16 MED ORDER — PROPOFOL 10 MG/ML IV BOLUS
INTRAVENOUS | Status: AC
Start: 2014-11-16 — End: 2014-11-16
  Filled 2014-11-16: qty 20

## 2014-11-16 MED ORDER — METOPROLOL SUCCINATE ER 25 MG PO TB24: 25.0000 mg | ORAL_TABLET | Freq: Every day | ORAL | Status: DC

## 2014-11-16 MED ORDER — FLECAINIDE ACETATE 50 MG PO TABS: 25.0000 mg | ORAL_TABLET | Freq: Two times a day (BID) | ORAL | Status: DC

## 2014-11-16 MED ORDER — PROPOFOL 10 MG/ML IV BOLUS
INTRAVENOUS | Status: AC | PRN
  Administered 2014-11-16: 60 mg via INTRAVENOUS

## 2014-11-16 NOTE — ED Provider Notes (Signed)
CSN: [id number]     Arrival date & time 11/16/14  0133 History  This chart was scribed for Geoffery Lyons, MD by Annye Asa, ED Scribe. This patient was seen in room D33C/D33C and the patient's care was started at 1:59 AM.    Chief Complaint  Patient presents with  . Chest Pain   The history is provided by the patient and the spouse. No language interpreter was used.     HPI Comments: ARDEL LITTLER is a 59 y.o. female with past medical history of a-fib, mitral valve prolapse (bovine mitral valve, 2008) who presents to the Emergency Department complaining of sudden onset chest pain, described as heaviness, that woke her from sleep around 00:41 this morning. Her pain was originally rated 8/10 and is now described as 6-7/10. Patient states her pain is exacerbated with deep breathing; explains, "It hurts to breathe, it's very sore. I can breathe out but it hurts to breathe in." She reports one prior experience with similar symptoms three years ago. Patient notes prior experience was associated heavy exercise; she states that she is moving house at this time and has been very physically active recently.   She is not on any blood pressure medications or anticoagulants at this time. She takes a baby aspirin and 100mg  bupropion daily. Cardiologist Dr. Jacinto Halim.   Past Medical History  Diagnosis Date  . Atrial fibrillation   . Asthma   . Osteoporosis   . Mitral valve prolapse    Past Surgical History  Procedure Laterality Date  . Tubal ligation    . Mitral valve replacement  2008   Family History  Problem Relation Age of Onset  . Diabetes Father   . Stroke Father   . Hypertension Sister   . Cancer Paternal Uncle     lung cancer  . Breast cancer Maternal Grandmother 37   History  Substance Use Topics  . Smoking status: Never Smoker   . Smokeless tobacco: Not on file  . Alcohol Use: No   OB History    Gravida Para Term Preterm AB TAB SAB Ectopic Multiple Living   1 1 1       2       Review of Systems  A complete 10 system review of systems was obtained and all systems are negative except as noted in the HPI and PMH.    Allergies  Review of patient's allergies indicates no known allergies.  Home Medications   Prior to Admission medications   Medication Sig Start Date End Date Taking? Authorizing Provider  aspirin 81 MG tablet Take 81 mg by mouth daily.      Historical Provider, MD  buPROPion (WELLBUTRIN XL) 150 MG 24 hr tablet Take 1 capsule by mouth Daily. 11/25/10   Historical Provider, MD  calcium citrate-vitamin D (CITRACAL+D) 315-200 MG-UNIT per tablet Take 1 tablet by mouth daily.      Historical Provider, MD  Cholecalciferol (VITAMIN D) 1000 UNITS capsule Take 1,000 Units by mouth daily.      Historical Provider, MD  Coenzyme Q10 (COQ-10) 100 MG CAPS Take 1 tablet by mouth daily.      Historical Provider, MD  cyanocobalamin 100 MCG tablet Take 100 mcg by mouth daily.    Historical Provider, MD  Diclofenac Sodium (PENNSAID) 1.5 % SOLN Place 20 drops onto the skin 2 (two) times daily. To right foot for pain and swelling    Historical Provider, MD  Fish Oil OIL 15 mLs by Does not apply  route daily.    Historical Provider, MD  MAGNESIUM CHLORIDE PO Take 1 tablet by mouth daily.      Historical Provider, MD  Multiple Vitamin (MULTIVITAMIN) capsule Take 1 capsule by mouth daily.      Historical Provider, MD  VITAMIN A PO Take by mouth.    Historical Provider, MD  vitamin C (ASCORBIC ACID) 500 MG tablet Take 500 mg by mouth daily.    Historical Provider, MD   BP 126/85 mmHg  Temp(Src) 98.4 F (36.9 C) (Oral)  Resp 26  Ht  (1.702 m)  Wt 118 lb (53.524 kg)  BMI 18.48 kg/m2  SpO2 99% Physical Exam  Constitutional: She is oriented to person, place, and time. She appears well-developed and well-nourished.  HENT:  Head: Normocephalic and atraumatic.  Neck: No tracheal deviation present.  Cardiovascular: Normal heart sounds.  An irregularly irregular rhythm  present. Exam reveals no gallop and no friction rub.   No murmur heard. Pulmonary/Chest: Effort normal and breath sounds normal. No respiratory distress. She has no wheezes. She has no rales. She exhibits no tenderness.  Abdominal: Soft. There is no tenderness. There is no rebound and no guarding.  Musculoskeletal: Normal range of motion. She exhibits no edema.  Neurological: She is alert and oriented to person, place, and time.  Skin: Skin is warm and dry.  Psychiatric: She has a normal mood and affect. Her behavior is normal.  Nursing note and vitals reviewed.   ED Course  CARDIOVERSION Date/Time: 11/16/2014 4:30 AM Performed by: Geoffery Lyons Authorized by: Geoffery Lyons Consent: Verbal consent obtained. Written consent obtained. Risks and benefits: risks, benefits and alternatives were discussed Consent given by: patient Patient understanding: patient states understanding of the procedure being performed Patient consent: the patient's understanding of the procedure matches consent given Procedure consent: procedure consent matches procedure scheduled Relevant documents: relevant documents present and verified Test results: test results available and properly labeled Imaging studies: imaging studies available Patient identity confirmed: verbally with patient, arm band and hospital-assigned identification number Patient sedated: yes Sedation type: moderate (conscious) sedation Sedatives: propofol Sedation start date/time: 11/16/2014 4:30 AM Sedation end date/time: 11/16/2014 4:45 AM Cardioversion basis: emergent Pre-procedure rhythm: atrial flutter Patient position: patient was placed in a supine position Chest area: chest area exposed Electrodes: pads Electrodes placed: anterior-posterior Number of attempts: 1 Attempt 1 mode: synchronous Attempt 1 waveform: biphasic Attempt 1 shock (in Joules): 150 Attempt 1 outcome: conversion to normal sinus rhythm Post-procedure rhythm:  normal sinus rhythm Complications: no complications Patient tolerance: Patient tolerated the procedure well with no immediate complications     DIAGNOSTIC STUDIES: Oxygen Saturation is 99% on RA, normal by my interpretation.    COORDINATION OF CARE: 2:07 AM Discussed treatment plan with pt at bedside and pt agreed to plan.   Labs Review Labs Reviewed  CBC  BASIC METABOLIC PANEL  BRAIN NATRIURETIC PEPTIDE  I-STAT TROPOININ, ED    Imaging Review No results found.   EKG Interpretation   Date/Time:  Tuesday November 16 2014 01:41:29 EDT Ventricular Rate:  124 PR Interval:    QRS Duration: 136 QT Interval:  367 QTC Calculation: 527 R Axis:   -79 Text Interpretation:  Atrial fibrillation Nonspecific IVCD with LAD LVH  with secondary repolarization abnormality Inferior infarct, acute (RCA)  Lateral leads are also involved Probable RV involvement, suggest recording  right precordial leads Confirmed by Trigo Winterbottom  MD, Junie Engram ([id number]) on 11/16/2014  1:47:50 AM      MDM   Final  diagnoses:  None    Patient is a 59 year old female who presents with complaints of shortness of breath, tightness in her chest, palpitations. Her EKG reveals her to be in atrial flutter with a rate of 120 to 130. Her troponin is negative and electrolytes are unremarkable. I've discussed this with Dr. Jacinto Halim from cardiology who is recommending cardioversion as the onset of her symptoms is known to be within the last 2 hours.  Dr. Jacinto Halim cane to the emergency department and cardioversion was performed.  CRITICAL CARE Performed by: Geoffery Lyons Total critical care time: 60 minutes Critical care time was exclusive of separately billable procedures and treating other patients. Critical care was necessary to treat or prevent imminent or life-threatening deterioration. Critical care was time spent personally by me on the following activities: development of treatment plan with patient and/or surrogate as well as  nursing, discussions with consultants, evaluation of patient's response to treatment, examination of patient, obtaining history from patient or surrogate, ordering and performing treatments and interventions, ordering and review of laboratory studies, ordering and review of radiographic studies, pulse oximetry and re-evaluation of patient's condition.   I personally performed the services described in this documentation, which was scribed in my presence. The recorded information has been reviewed and is accurate.        Geoffery Lyons, MD 11/16/14 [phone]

## 2014-11-16 NOTE — Discharge Instructions (Signed)
Follow-up with Dr. Jacinto Halim as scheduled, and return to the emergency department if symptoms significantly worsen or change.   Atrial Flutter Atrial flutter is a heart rhythm that can cause the heart to beat very fast (tachycardia). It originates in the upper chambers of the heart (atria). In atrial flutter, the top chambers of the heart (atria) often beat much faster than the bottom chambers of the heart (ventricles). Atrial flutter has a regular "saw toothed" appearance in an EKG readout. An EKG is a test that records the electrical activity of the heart. Atrial flutter can cause the heart to beat up to 150 beats per minute (BPM). Atrial flutter can either be short lived (paroxysmal) or permanent.  CAUSES  Causes of atrial flutter can be many. Some of these include:  Heart related issues:  Heart attack (myocardial infarction).  Heart failure.  Heart valve problems.  Poorly controlled high blood pressure (hypertension).  After open heart surgery.  Lung related issues:  A blood clot in the lungs (pulmonary embolism).  Chronic obstructive pulmonary disease (COPD). Medications used to treat COPD can attribute to atrial flutter.  Other related causes:  Hyperthyroidism.  Caffeine.  Some decongestant cold medications.  Low electrolyte levels such as potassium or magnesium.  Cocaine. SYMPTOMS  An awareness of your heart beating rapidly (palpitations).  Shortness of breath.  Chest pain.  Low blood pressure (hypotension).  Dizziness or fainting. DIAGNOSIS  Different tests can be performed to diagnose atrial flutter.   An EKG.  Holter monitor. This is a 24-hour recording of your heart rhythm. You will also be given a diary. Write down all symptoms that you have and what you were doing at the time you experienced symptoms.  Cardiac event monitor. This small device can be worn for up to 30 days. When you have heart symptoms, you will push a button on the device. This will  then record your heart rhythm.  Echocardiogram. This is an imaging test to look at your heart. Your caregiver will look at your heart valves and the ventricles.  Stress test. This test can help determine if the atrial flutter is related to exercise or if coronary artery disease is present.  Laboratory studies will look at certain blood levels like:  Complete blood count (CBC).  Potassium.  Magnesium.  Thyroid function. TREATMENT  Treatment of atrial flutter varies. A combination of therapies may be used or sometimes atrial flutter may need only 1 type of treatment.  Lab work: If your blood work, such as your electrolytes (potassium, magnesium) or your thyroid function tests, are abnormal, your caregiver will treat them accordingly.  Medication:  There are several different types of medications that can convert your heart to a normal rhythm and prevent atrial flutter from reoccurring.  Nonsurgical procedures: Nonsurgical techniques may be used to control atrial flutter. Some examples include:  Cardioversion. This technique uses either drugs or an electrical shock to restore a normal heart rhythm:  Cardioversion drugs may be given through an intravenous (IV) line to help "reset" the heart rhythm.  In electrical cardioversion, your caregiver shocks your heart with electrical energy. This helps to reset the heartbeat to a normal rhythm.  Ablation. If atrial flutter is a persistent problem, an ablation may be needed. This procedure is done under mild sedation. High frequency radio-wave energy is used to destroy the area of heart tissue responsible for atrial flutter. SEEK IMMEDIATE MEDICAL CARE IF:  You have:  Dizziness.  Near fainting or fainting.  Shortness of  breath.  Chest pain or pressure.  Sudden nausea or vomiting.  Profuse sweating. If you have the above symptoms, call your local emergency service immediately! Do not drive yourself to the hospital. MAKE SURE YOU:    Understand these instructions.  Will watch your condition.  Will get help right away if you are not doing well or get worse. Document Released: 09/30/2008 Document Revised: 09/28/2013 Document Reviewed: 09/30/2008 [facility] Patient Information 2015 [place], [place]. This information is not intended to replace advice given to you by your health care provider. Make sure you discuss any questions you have with your health care provider.

## 2014-11-16 NOTE — ED Notes (Signed)
Onset today woke up at with chest pain heavy discomfort 8/10. Currently 6-7/10 discomfort and shortness of breath.

## 2014-11-16 NOTE — ED Notes (Signed)
Family at bedside. 

## 2014-11-16 NOTE — ED Notes (Signed)
Patient is alert and orientedx4.  Patient was explained discharge instructions and they understood them with no questions.  The patient's husband, Rosalia Hammers is taking the patient home.

## 2014-11-16 NOTE — H&P (Signed)
Martha Clark is an 59 y.o. female.   Chief Complaint: Chest tightness, palpitations and dyspnea HPI: Martha Clark  is a 59 y.o. 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 Erenest Rasher a [facility] in 2008, doing well until last night around 12:30, did not feel well and eventually presented to the emergency room and was found to be in atrial flutter with rapid ventricular response. I was called to see the patient and evaluate her.  Patient has no other significant cardiovascular risk factors, she has normal coronary arteries. She does not smoke, does not use any illicit drugs. She is not a diabetic and does not of hypertension. She has perioperative history of atrial fibrillation in the past without any recurrence. Husband present at the bedside.  Past Medical History  Diagnosis Date  . Atrial fibrillation   . Asthma   . Osteoporosis   . Mitral valve prolapse     Past Surgical History  Procedure Laterality Date  . Tubal ligation    . Mitral valve replacement  2008    Family History  Problem Relation Age of Onset  . Diabetes Father   . Stroke Father   . Hypertension Sister   . Cancer Paternal Uncle     lung cancer  . Breast cancer Maternal Grandmother 79   Social History:  reports that she has never smoked. She does not have any smokeless tobacco history on file. She reports that she does not drink alcohol or use illicit drugs.  Allergies: No Known Allergies  Review of Systems - Negative except New-onset of shortness of breath and chest tightness.  Blood pressure 125/76, pulse 115, temperature 98.4 F (36.9 C), temperature source Oral, resp. rate 18, height _0  (1.702 m), weight 53.524 kg (118 lb), SpO2 98 %. General appearance: alert, cooperative, appears stated age and no distress Eyes: negative findings: lids and lashes normal Neck: no adenopathy, no carotid bruit, no JVD, supple,  symmetrical, trachea midline and thyroid not enlarged, symmetric, no tenderness/mass/nodules Neck: JVP - normal, carotids 2+= without bruits Resp: clear to auscultation bilaterally Chest wall: no tenderness Cardio: regular rate and rhythm, S1, S2 normal, no murmur, click, rub or gallop and Tachycardia present GI: soft, non-tender; bowel sounds normal; no masses,  no organomegaly Extremities: extremities normal, atraumatic, no cyanosis or edema Pulses: 2+ and symmetric Skin: Skin color, texture, turgor normal. No rashes or lesions Neurologic: Grossly normal  Results for orders placed or performed during the hospital encounter of 11/16/14 (from the past 48 hour(s))  CBC     Status: None   Collection Time: 11/16/14  1:53 AM  Result Value Ref Range   WBC 9.6 4.0 - 10.5 K/uL   RBC 4.78 3.87 - 5.11 MIL/uL   Hemoglobin 14.0 12.0 - 15.0 g/dL   HCT 41.6 36.0 - 46.0 %   MCV 87.0 78.0 - 100.0 fL   MCH 29.3 26.0 - 34.0 pg   MCHC 33.7 30.0 - 36.0 g/dL   RDW 12.8 11.5 - 15.5 %   Platelets 169 150 - 400 K/uL  Basic metabolic panel     Status: Abnormal   Collection Time: 11/16/14  1:53 AM  Result Value Ref Range   Sodium 140 135 - 145 mmol/L   Potassium 3.9 3.5 - 5.1 mmol/L   Chloride 102 101 - 111 mmol/L   CO2 30 22 - 32 mmol/L   Glucose, Bld 117 (H) 65 -  99 mg/dL   BUN 16 6 - 20 mg/dL   Creatinine, Ser 0.87 0.44 - 1.00 mg/dL   Calcium 8.8 (L) 8.9 - 10.3 mg/dL   GFR calc non Af Amer >60 >60 mL/min   GFR calc Af Amer >60 >60 mL/min    Comment: (NOTE) The eGFR has been calculated using the CKD EPI equation. This calculation has not been validated in all clinical situations. eGFR's persistently <60 mL/min signify possible Chronic Kidney Disease.    Anion gap 8 5 - 15  BNP (order ONLY if patient complains of dyspnea/SOB AND you have documented it for THIS visit)     Status: Abnormal   Collection Time: 11/16/14  1:53 AM  Result Value Ref Range   B Natriuretic Peptide 263.3 (H) 0.0 - 100.0  pg/mL  I-stat troponin, ED  (not at [facility], [facility])     Status: None   Collection Time: 11/16/14  2:50 AM  Result Value Ref Range   Troponin i, poc 0.00 0.00 - 0.08 ng/mL   Comment 3            Comment: Due to the release kinetics of cTnI, a negative result within the first hours of the onset of symptoms does not rule out myocardial infarction with certainty. If myocardial infarction is still suspected, repeat the test at appropriate intervals.    Dg Chest Port 1 View  11/16/2014   CLINICAL DATA:  Acute onset of chest discomfort and shortness of breath. Initial encounter.  EXAM: PORTABLE CHEST - 1 VIEW  COMPARISON:  Chest radiograph performed 09/27/2011  FINDINGS: The lungs are well-aerated and clear. There is no evidence of focal opacification, pleural effusion or pneumothorax.  The cardiomediastinal silhouette is borderline enlarged. No acute osseous abnormalities are seen. Postoperative change is noted overlying the right lower lung zone.  IMPRESSION: Borderline cardiomegaly.  No acute cardiopulmonary process seen.   Electronically Signed   By: Garald Balding M.D.   On: 11/16/2014 02:25    Labs:   Lab Results  Component Value Date   WBC 9.6 11/16/2014   HGB 14.0 11/16/2014   HCT 41.6 11/16/2014   MCV 87.0 11/16/2014   PLT 169 11/16/2014    Recent Labs Lab 11/16/14 0153  NA 140  K 3.9  CL 102  CO2 30  BUN 16  CREATININE 0.87  CALCIUM 8.8*  GLUCOSE 117*    Lipid Panel  No results found for: CHOL, TRIG, HDL, CHOLHDL, VLDL, LDLCALC  BNP (last 3 results)  Recent Labs  11/16/14 0153  BNP 263.3*    ProBNP (last 3 results) No results for input(s): PROBNP in the last 8760 hours.  HEMOGLOBIN A1C No results found for: HGBA1C, MPG  Cardiac Panel (last 3 results) No results for input(s): CKTOTAL, CKMB, TROPONINI, RELINDX in the last 8760 hours.  Lab Results  Component Value Date   TROPONINI <0.30 09/28/2011     TSH No results for input(s): TSH in the last 8760  hours.  EKG: 11/16/2014: Atrial flutter with variable ventricular response, right bundle branch block. No evidence of ischemia.  Current facility-administered medications:  .  propofol (DIPRIVAN) 10 mg/mL bolus/IV push, , , ,  .  propofol (DIPRIVAN) 10 mg/mL bolus/IV push, , Intravenous, Continuous PRN, Adrian Prows, MD, 60 mg at 11/16/14 0356  Current outpatient prescriptions:  .  acetaminophen (TYLENOL) 325 MG tablet, Take 1,300 mg by mouth every 6 (six) hours as needed for mild pain., Disp: , Rfl:  .  aspirin 81 MG  tablet, Take 81 mg by mouth daily.  , Disp: , Rfl:  .  buPROPion (WELLBUTRIN XL) 150 MG 24 hr tablet, Take 1 capsule by mouth Daily., Disp: , Rfl:  .  calcium citrate-vitamin D (CITRACAL+D) 315-200 MG-UNIT per tablet, Take 1 tablet by mouth daily.  , Disp: , Rfl:  .  Cholecalciferol (VITAMIN D) 1000 UNITS capsule, Take 1,000 Units by mouth daily.  , Disp: , Rfl:  .  Coenzyme Q10 (COQ-10) 100 MG CAPS, Take 1 tablet by mouth daily.  , Disp: , Rfl:  .  Fish Oil OIL, Take 15 mLs by mouth daily. , Disp: , Rfl:  .  MAGNESIUM CHLORIDE PO, Take 1 tablet by mouth daily.  , Disp: , Rfl:  .  Multiple Vitamin (MULTIVITAMIN WITH MINERALS) TABS tablet, Take 1 tablet by mouth daily., Disp: , Rfl:  .  vitamin C (ASCORBIC ACID) 500 MG tablet, Take 500 mg by mouth daily., Disp: , Rfl:   Assessment/Plan 1. New onset atrial flutter with rapid ventricular response, symptomatic. CHA2DS2-VASCScore: Risk Score  1,  Yearly risk of stroke  1.3. Recommendation: ASA or Anticoagulation.  2. History of mitral valve replacement, Bovine mitral valve prosthesis is in 2008 by Dr. Erenest Rasher at [facility] for mitral prolapse.  Recommendation: Patient underwent successful direct current cardioversion. She maintained sinus rhythm. I will discharge her home today, I will start the patient on flecainide 25 mg by mouth twice a day for now, she has an appointment to see me tomorrow morning which she will keep.  Adrian Prows, MD 11/16/2014, 4:01 AM [facility]. [place] Pager: [phone] Office: [phone] If no answer: Cell:  [phone]

## 2014-11-16 NOTE — CV Procedure (Signed)
Direct current cardioversion:  Indication symptomatic A. Fibrillation.  Procedure: Using 80 mg of IV Propofol for achieving deep sedation, synchronized direct current cardioversion performed. Patient was delivered with 150 Joules of electricity X 1 with success to NSR. Patient tolerated the procedure well. No immediate complication noted.

## 2014-11-20 ENCOUNTER — Encounter (HOSPITAL_COMMUNITY)
Admission: EM | Disposition: A | Discharge status: Home / Self Care | Payer: Self-pay | Source: Home / Self Care | Attending: Neurology

## 2014-11-20 ENCOUNTER — Emergency Department (HOSPITAL_COMMUNITY): Payer: BLUE CROSS/BLUE SHIELD

## 2014-11-20 ENCOUNTER — Inpatient Hospital Stay (HOSPITAL_COMMUNITY): Payer: BLUE CROSS/BLUE SHIELD | Admitting: Anesthesiology

## 2014-11-20 ENCOUNTER — Inpatient Hospital Stay (HOSPITAL_COMMUNITY): Payer: BLUE CROSS/BLUE SHIELD

## 2014-11-20 ENCOUNTER — Inpatient Hospital Stay (HOSPITAL_COMMUNITY)
Admission: EM | Admit: 2014-11-20 | Discharge: 2014-11-24 | DRG: 024 | Disposition: A | Discharge status: Home / Self Care | Payer: BLUE CROSS/BLUE SHIELD | Attending: Neurology | Admitting: Neurology

## 2014-11-20 ENCOUNTER — Encounter (HOSPITAL_COMMUNITY): Payer: Self-pay | Admitting: Family Medicine

## 2014-11-20 DIAGNOSIS — I6340 Cerebral infarction due to embolism of unspecified cerebral artery: Secondary | ICD-10-CM | POA: Diagnosis present

## 2014-11-20 DIAGNOSIS — I10 Essential (primary) hypertension: Secondary | ICD-10-CM | POA: Diagnosis present

## 2014-11-20 DIAGNOSIS — I480 Paroxysmal atrial fibrillation: Secondary | ICD-10-CM | POA: Diagnosis present

## 2014-11-20 DIAGNOSIS — M810 Age-related osteoporosis without current pathological fracture: Secondary | ICD-10-CM | POA: Diagnosis present

## 2014-11-20 DIAGNOSIS — D649 Anemia, unspecified: Secondary | ICD-10-CM | POA: Diagnosis present

## 2014-11-20 DIAGNOSIS — Z8673 Personal history of transient ischemic attack (TIA), and cerebral infarction without residual deficits: Secondary | ICD-10-CM | POA: Insufficient documentation

## 2014-11-20 DIAGNOSIS — I959 Hypotension, unspecified: Secondary | ICD-10-CM | POA: Diagnosis not present

## 2014-11-20 DIAGNOSIS — Q251 Coarctation of aorta: Secondary | ICD-10-CM | POA: Diagnosis not present

## 2014-11-20 DIAGNOSIS — R29810 Facial weakness: Secondary | ICD-10-CM | POA: Diagnosis present

## 2014-11-20 DIAGNOSIS — I63411 Cerebral infarction due to embolism of right middle cerebral artery: Principal | ICD-10-CM | POA: Diagnosis present

## 2014-11-20 DIAGNOSIS — Z79899 Other long term (current) drug therapy: Secondary | ICD-10-CM

## 2014-11-20 DIAGNOSIS — I639 Cerebral infarction, unspecified: Secondary | ICD-10-CM | POA: Diagnosis present

## 2014-11-20 DIAGNOSIS — Z7982 Long term (current) use of aspirin: Secondary | ICD-10-CM | POA: Diagnosis not present

## 2014-11-20 DIAGNOSIS — I6789 Other cerebrovascular disease: Secondary | ICD-10-CM | POA: Diagnosis not present

## 2014-11-20 DIAGNOSIS — I4892 Unspecified atrial flutter: Secondary | ICD-10-CM | POA: Diagnosis present

## 2014-11-20 DIAGNOSIS — I609 Nontraumatic subarachnoid hemorrhage, unspecified: Secondary | ICD-10-CM

## 2014-11-20 DIAGNOSIS — E785 Hyperlipidemia, unspecified: Secondary | ICD-10-CM | POA: Diagnosis present

## 2014-11-20 DIAGNOSIS — Z953 Presence of xenogenic heart valve: Secondary | ICD-10-CM

## 2014-11-20 DIAGNOSIS — E871 Hypo-osmolality and hyponatremia: Secondary | ICD-10-CM | POA: Diagnosis not present

## 2014-11-20 DIAGNOSIS — R471 Dysarthria and anarthria: Secondary | ICD-10-CM | POA: Diagnosis present

## 2014-11-20 DIAGNOSIS — E876 Hypokalemia: Secondary | ICD-10-CM | POA: Diagnosis present

## 2014-11-20 DIAGNOSIS — R739 Hyperglycemia, unspecified: Secondary | ICD-10-CM | POA: Diagnosis present

## 2014-11-20 DIAGNOSIS — G8194 Hemiplegia, unspecified affecting left nondominant side: Secondary | ICD-10-CM | POA: Diagnosis present

## 2014-11-20 DIAGNOSIS — J9690 Respiratory failure, unspecified, unspecified whether with hypoxia or hypercapnia: Secondary | ICD-10-CM | POA: Diagnosis not present

## 2014-11-20 DIAGNOSIS — J9600 Acute respiratory failure, unspecified whether with hypoxia or hypercapnia: Secondary | ICD-10-CM | POA: Diagnosis not present

## 2014-11-20 HISTORY — DX: Personal history of transient ischemic attack (TIA), and cerebral infarction without residual deficits: Z86.73

## 2014-11-20 HISTORY — PX: RADIOLOGY WITH ANESTHESIA: SHX6223

## 2014-11-20 HISTORY — DX: Cerebral infarction due to embolism of unspecified cerebral artery: I63.40

## 2014-11-20 LAB — COMPREHENSIVE METABOLIC PANEL
ALT: 43 U/L (ref 14–54)
AST: 34 U/L (ref 15–41)
Albumin: 3.4 g/dL — ABNORMAL LOW (ref 3.5–5.0)
Alkaline Phosphatase: 72 U/L (ref 38–126)
Anion gap: 12 (ref 5–15)
BUN: 22 mg/dL — ABNORMAL HIGH (ref 6–20)
CO2: 23 mmol/L (ref 22–32)
Calcium: 8.9 mg/dL (ref 8.9–10.3)
Chloride: 107 mmol/L (ref 101–111)
Creatinine, Ser: 0.76 mg/dL (ref 0.44–1.00)
GFR calc Af Amer: >60 mL/min (ref >60)
GFR calc non Af Amer: >60 mL/min (ref >60)
Glucose, Bld: 118 mg/dL — ABNORMAL HIGH (ref 65–99)
Potassium: 3.8 mmol/L (ref 3.5–5.1)
Sodium: 142 mmol/L (ref 135–145)
Total Bilirubin: 0.5 mg/dL (ref 0.3–1.2)
Total Protein: 5.9 g/dL — ABNORMAL LOW (ref 6.5–8.1)

## 2014-11-20 LAB — DIFFERENTIAL
Basophils Absolute: 0 10*3/uL (ref 0.0–0.1)
Basophils Relative: 1 % (ref 0–1)
Eosinophils Absolute: 0.2 10*3/uL (ref 0.0–0.7)
Eosinophils Relative: 4 % (ref 0–5)
Lymphocytes Relative: 37 % (ref 12–46)
Lymphs Abs: 2 10*3/uL (ref 0.7–4.0)
Monocytes Absolute: 0.4 10*3/uL (ref 0.1–1.0)
Monocytes Relative: 8 % (ref 3–12)
Neutro Abs: 2.7 10*3/uL (ref 1.7–7.7)
Neutrophils Relative %: 50 % (ref 43–77)

## 2014-11-20 LAB — I-STAT TROPONIN, ED: Troponin i, poc: 0.03 ng/mL (ref 0.00–0.08)

## 2014-11-20 LAB — I-STAT CHEM 8, ED
BUN: 24 mg/dL — ABNORMAL HIGH (ref 6–20)
Calcium, Ion: 1.1 mmol/L — ABNORMAL LOW (ref 1.12–1.23)
Chloride: 106 mmol/L (ref 101–111)
Creatinine, Ser: 0.8 mg/dL (ref 0.44–1.00)
Glucose, Bld: 117 mg/dL — ABNORMAL HIGH (ref 65–99)
HCT: 40 % (ref 36.0–46.0)
Hemoglobin: 13.6 g/dL (ref 12.0–15.0)
Potassium: 3.8 mmol/L (ref 3.5–5.1)
Sodium: 142 mmol/L (ref 135–145)
TCO2: 26 mmol/L (ref 0–100)

## 2014-11-20 LAB — PROTIME-INR
INR: 1.09 (ref 0.00–1.49)
Prothrombin Time: 14.3 seconds (ref 11.6–15.2)

## 2014-11-20 LAB — URINALYSIS, ROUTINE W REFLEX MICROSCOPIC
Bilirubin Urine: NEGATIVE
Glucose, UA: NEGATIVE mg/dL
Ketones, ur: 15 mg/dL — AB
Leukocytes, UA: NEGATIVE
Nitrite: NEGATIVE
Protein, ur: NEGATIVE mg/dL
Specific Gravity, Urine: 1.027 (ref 1.005–1.030)
Urobilinogen, UA: 1 mg/dL (ref 0.0–1.0)
pH: 7 (ref 5.0–8.0)

## 2014-11-20 LAB — GLUCOSE, CAPILLARY: Glucose-Capillary: 105 mg/dL — ABNORMAL HIGH (ref 65–99)

## 2014-11-20 LAB — BLOOD GAS, ARTERIAL
Acid-base deficit: 2.3 mmol/L — ABNORMAL HIGH (ref 0.0–2.0)
Bicarbonate: 22.7 mEq/L (ref 20.0–24.0)
Drawn by: 283401
FIO2: 0.4 %
MECHVT: 500 mL
O2 Saturation: 99.3 %
PEEP: 5 cmH2O
Patient temperature: 97.7
RATE: 14 resp/min
TCO2: 24 mmol/L (ref 0–100)
pCO2 arterial: 42.7 mmHg (ref 35.0–45.0)
pH, Arterial: 7.341 — ABNORMAL LOW (ref 7.350–7.450)
pO2, Arterial: 182 mmHg — ABNORMAL HIGH (ref 80.0–100.0)

## 2014-11-20 LAB — MRSA PCR SCREENING: MRSA by PCR: NEGATIVE

## 2014-11-20 LAB — CBC
HCT: 39.5 % (ref 36.0–46.0)
Hemoglobin: 13 g/dL (ref 12.0–15.0)
MCH: 28.3 pg (ref 26.0–34.0)
MCHC: 32.9 g/dL (ref 30.0–36.0)
MCV: 86.1 fL (ref 78.0–100.0)
Platelets: 173 10*3/uL (ref 150–400)
RBC: 4.59 MIL/uL (ref 3.87–5.11)
RDW: 12.5 % (ref 11.5–15.5)
WBC: 5.3 10*3/uL (ref 4.0–10.5)

## 2014-11-20 LAB — URINE MICROSCOPIC-ADD ON

## 2014-11-20 LAB — CBG MONITORING, ED: Glucose-Capillary: 119 mg/dL — ABNORMAL HIGH (ref 65–99)

## 2014-11-20 LAB — TRIGLYCERIDES: Triglycerides: 62 mg/dL (ref <150)

## 2014-11-20 LAB — APTT: aPTT: 27 seconds (ref 24–37)

## 2014-11-20 SURGERY — RADIOLOGY WITH ANESTHESIA
Anesthesia: General

## 2014-11-20 MED ORDER — ALTEPLASE (STROKE) FULL DOSE INFUSION
0.9000 mg/kg | Freq: Once | INTRAVENOUS | Status: AC
  Administered 2014-11-20: 50 mg via INTRAVENOUS
  Filled 2014-11-20: qty 50

## 2014-11-20 MED ORDER — SODIUM CHLORIDE 0.9 % IV SOLN
INTRAVENOUS | Status: DC | PRN
  Administered 2014-11-20 (×2): via INTRAVENOUS

## 2014-11-20 MED ORDER — ROCURONIUM BROMIDE 100 MG/10ML IV SOLN
INTRAVENOUS | Status: DC | PRN
  Administered 2014-11-20: 30 mg via INTRAVENOUS

## 2014-11-20 MED ORDER — LIDOCAINE HCL (CARDIAC) 20 MG/ML IV SOLN
INTRAVENOUS | Status: DC | PRN
  Administered 2014-11-20: 60 mg via INTRAVENOUS

## 2014-11-20 MED ORDER — SUCCINYLCHOLINE CHLORIDE 20 MG/ML IJ SOLN
INTRAMUSCULAR | Status: DC | PRN
  Administered 2014-11-20: 40 mg via INTRAVENOUS

## 2014-11-20 MED ORDER — STROKE: EARLY STAGES OF RECOVERY BOOK
Freq: Once | Status: AC
  Administered 2014-11-20: 1
  Filled 2014-11-20: qty 1

## 2014-11-20 MED ORDER — SODIUM CHLORIDE 0.9 % IV SOLN
INTRAVENOUS | Status: DC
  Administered 2014-11-21 – 2014-11-22 (×2): via INTRAVENOUS

## 2014-11-20 MED ORDER — NITROGLYCERIN 1 MG/10 ML FOR IR/CATH LAB
100.0000 ug | INTRA_ARTERIAL | Status: DC
  Administered 2014-11-20: 75 ug via INTRA_ARTERIAL

## 2014-11-20 MED ORDER — MIDAZOLAM HCL 2 MG/2ML IJ SOLN
INTRAMUSCULAR | Status: DC | PRN
  Administered 2014-11-20: 1 mg via INTRAVENOUS

## 2014-11-20 MED ORDER — LIDOCAINE HCL 1 % IJ SOLN
INTRAMUSCULAR | Status: AC
Start: 2014-11-20 — End: 2014-11-21
  Filled 2014-11-20: qty 20

## 2014-11-20 MED ORDER — ACETAMINOPHEN 650 MG RE SUPP: 650.0000 mg | RECTAL | Status: DC | PRN

## 2014-11-20 MED ORDER — ALTEPLASE 30 MG/30 ML FOR INTERV. RAD
1.0000 mg | INTRA_ARTERIAL | Status: AC | PRN
  Filled 2014-11-20: qty 30

## 2014-11-20 MED ORDER — PANTOPRAZOLE SODIUM 40 MG IV SOLR
40.0000 mg | Freq: Every day | INTRAVENOUS | Status: DC
  Administered 2014-11-20 – 2014-11-21 (×2): 40 mg via INTRAVENOUS
  Filled 2014-11-20 (×3): qty 40

## 2014-11-20 MED ORDER — ALTEPLASE 30 MG/30 ML FOR INTERV. RAD
1.0000 mg | INTRA_ARTERIAL | Status: DC | PRN
  Administered 2014-11-20: 5 mg via INTRA_ARTERIAL

## 2014-11-20 MED ORDER — CEFAZOLIN SODIUM-DEXTROSE 2-3 GM-% IV SOLR
INTRAVENOUS | Status: AC
Start: 2014-11-20 — End: 2014-11-21
  Filled 2014-11-20: qty 50

## 2014-11-20 MED ORDER — ACETAMINOPHEN 650 MG RE SUPP: 650.0000 mg | Freq: Four times a day (QID) | RECTAL | Status: DC | PRN

## 2014-11-20 MED ORDER — FENTANYL CITRATE (PF) 250 MCG/5ML IJ SOLN
INTRAMUSCULAR | Status: DC | PRN
  Administered 2014-11-20: 200 ug via INTRAVENOUS
  Administered 2014-11-20: 50 ug via INTRAVENOUS

## 2014-11-20 MED ORDER — SODIUM CHLORIDE 0.9 % IV SOLN
INTRAVENOUS | Status: DC
  Administered 2014-11-20 – 2014-11-22 (×2): via INTRAVENOUS

## 2014-11-20 MED ORDER — CEFAZOLIN (ANCEF) 1 G IV SOLR: 2.0000 g | INTRAVENOUS | Status: DC

## 2014-11-20 MED ORDER — PHENYLEPHRINE HCL 10 MG/ML IJ SOLN
10.0000 mg | INTRAVENOUS | Status: DC | PRN
  Administered 2014-11-20: 25 ug/min via INTRAVENOUS

## 2014-11-20 MED ORDER — CHLORHEXIDINE GLUCONATE 0.12 % MT SOLN
15.0000 mL | Freq: Two times a day (BID) | OROMUCOSAL | Status: DC
  Administered 2014-11-20 – 2014-11-21 (×2): 15 mL via OROMUCOSAL
  Filled 2014-11-20 (×2): qty 15

## 2014-11-20 MED ORDER — FENTANYL CITRATE (PF) 100 MCG/2ML IJ SOLN
100.0000 ug | INTRAMUSCULAR | Status: DC | PRN
  Administered 2014-11-21: 100 ug via INTRAVENOUS
  Filled 2014-11-20: qty 2

## 2014-11-20 MED ORDER — SENNOSIDES-DOCUSATE SODIUM 8.6-50 MG PO TABS
1.0000 | ORAL_TABLET | Freq: Every evening | ORAL | Status: DC | PRN
  Filled 2014-11-20: qty 1

## 2014-11-20 MED ORDER — LABETALOL HCL 5 MG/ML IV SOLN
INTRAVENOUS | Status: DC | PRN
  Administered 2014-11-20: 5 mg via INTRAVENOUS

## 2014-11-20 MED ORDER — SODIUM CHLORIDE 0.9 % IV BOLUS (SEPSIS)
500.0000 mL | Freq: Once | INTRAVENOUS | Status: AC
  Administered 2014-11-20: 500 mL via INTRAVENOUS

## 2014-11-20 MED ORDER — ACETAMINOPHEN 325 MG PO TABS: 650.0000 mg | ORAL_TABLET | ORAL | Status: DC | PRN

## 2014-11-20 MED ORDER — LABETALOL HCL 5 MG/ML IV SOLN: 10.0000 mg | INTRAVENOUS | Status: DC | PRN

## 2014-11-20 MED ORDER — NITROGLYCERIN 1 MG/10 ML FOR IR/CATH LAB
INTRA_ARTERIAL | Status: AC
  Administered 2014-11-20: 75 ug via INTRA_ARTERIAL
  Filled 2014-11-20: qty 10

## 2014-11-20 MED ORDER — ONDANSETRON HCL 4 MG/2ML IJ SOLN
4.0000 mg | Freq: Four times a day (QID) | INTRAMUSCULAR | Status: DC | PRN
  Administered 2014-11-21: 4 mg via INTRAVENOUS
  Filled 2014-11-20: qty 2

## 2014-11-20 MED ORDER — FENTANYL CITRATE (PF) 100 MCG/2ML IJ SOLN
100.0000 ug | INTRAMUSCULAR | Status: DC | PRN
  Administered 2014-11-20: 100 ug via INTRAVENOUS
  Filled 2014-11-20: qty 2

## 2014-11-20 MED ORDER — PROPOFOL 1000 MG/100ML IV EMUL
0.0000 ug/kg/min | INTRAVENOUS | Status: DC
  Administered 2014-11-21: 10 ug/kg/min via INTRAVENOUS
  Filled 2014-11-20: qty 100

## 2014-11-20 MED ORDER — CETYLPYRIDINIUM CHLORIDE 0.05 % MT LIQD
7.0000 mL | Freq: Four times a day (QID) | OROMUCOSAL | Status: DC
  Administered 2014-11-21 (×3): 7 mL via OROMUCOSAL

## 2014-11-20 MED ORDER — CEFAZOLIN SODIUM-DEXTROSE 2-3 GM-% IV SOLR
INTRAVENOUS | Status: DC | PRN
  Administered 2014-11-20: 2 g via INTRAVENOUS

## 2014-11-20 MED ORDER — PROPOFOL 10 MG/ML IV BOLUS
INTRAVENOUS | Status: DC | PRN
  Administered 2014-11-20: 140 mg via INTRAVENOUS

## 2014-11-20 MED ORDER — NICARDIPINE HCL IN NACL 20-0.86 MG/200ML-% IV SOLN
5.0000 mg/h | INTRAVENOUS | Status: DC
  Administered 2014-11-21: 5 mg/h via INTRAVENOUS
  Administered 2014-11-21: 7.5 mg/h via INTRAVENOUS
  Filled 2014-11-20 (×2): qty 200

## 2014-11-20 MED ORDER — PROPOFOL INFUSION 10 MG/ML OPTIME
INTRAVENOUS | Status: DC | PRN
  Administered 2014-11-20: 25 ug/kg/min via INTRAVENOUS

## 2014-11-20 MED ORDER — INSULIN ASPART 100 UNIT/ML ~~LOC~~ SOLN
1.0000 [IU] | SUBCUTANEOUS | Status: DC
  Administered 2014-11-21: 1 [IU] via SUBCUTANEOUS

## 2014-11-20 MED ORDER — ACETAMINOPHEN 500 MG PO TABS: 1000.0000 mg | ORAL_TABLET | Freq: Four times a day (QID) | ORAL | Status: DC | PRN

## 2014-11-20 MED ORDER — PHENYLEPHRINE HCL 10 MG/ML IJ SOLN
INTRAMUSCULAR | Status: DC | PRN
  Administered 2014-11-20 (×2): 40 ug via INTRAVENOUS

## 2014-11-20 NOTE — Consult Note (Signed)
PULMONARY / CRITICAL CARE MEDICINE   Name: KIMISHA KEITH MRN: [id number] DOB: 06-29-1955    ADMISSION DATE:  11/20/2014 CONSULTATION DATE:  11/20/2014  REFERRING MD :  Dr. Pearlean Brownie  CHIEF COMPLAINT:  Acute CVA  INITIAL PRESENTATION: 73 F s/p remote mitral valve replacement with new Dx Afib s/p cardioversion in ED on 6/21 who presented to [facility] [place] today with massive CVA now s/p peripheral and central tPA. Patient transferred from [facility] on vent and PCCM asked to consult on vent management.    STUDIES:  N/A  SIGNIFICANT EVENTS: Thrombectomy and Peripheral tPA 6/25  HISTORY OF PRESENT ILLNESS:  Ms. Hull is a 67 F  s/p remote mitral valve replacement (presumably tissue) with new Dx Afib s/p cardioversion in ED on 6/21 who presented to [facility] today with acute hemiparesis and facial droop. The following was obtained from chart review as the patient is currently intubated and sedated. On arrival she received IV tPA and was transferred to VIR for thrombectomy.   PAST MEDICAL HISTORY :   has a past medical history of Atrial fibrillation; Asthma; Osteoporosis; and Mitral valve prolapse.  has past surgical history that includes Tubal ligation and Mitral valve replacement (2008). Prior to Admission medications   Medication Sig Start Date End Date Taking? Authorizing Provider  acetaminophen (TYLENOL) 325 MG tablet Take 1,300 mg by mouth every 6 (six) hours as needed for mild pain.   Yes Historical Provider, MD  aspirin EC 81 MG tablet Take 81 mg by mouth daily.   Yes Historical Provider, MD  buPROPion (WELLBUTRIN XL) 150 MG 24 hr tablet Take 150 mg by mouth Daily.  11/25/10  Yes Historical Provider, MD  calcium citrate-vitamin D (CITRACAL+D) 315-200 MG-UNIT per tablet Take 1 tablet by mouth daily.     Yes Historical Provider, MD  flecainide (TAMBOCOR) 50 MG tablet Take 0.5 tablets (25 mg total) by mouth 2 (two) times daily. 11/16/14  Yes Yates Decamp, MD  metoprolol succinate (TOPROL XL) 25 MG 24 hr tablet Take  1 tablet (25 mg total) by mouth daily. 11/16/14  Yes Yates Decamp, MD  Cholecalciferol (VITAMIN D) 1000 UNITS capsule Take 1,000 Units by mouth daily.      Historical Provider, MD  Coenzyme Q10 (COQ-10) 100 MG CAPS Take 1 tablet by mouth daily.      Historical Provider, MD  Fish Oil OIL Take 15 mLs by mouth daily.     Historical Provider, MD  MAGNESIUM CHLORIDE PO Take 1 tablet by mouth daily.      Historical Provider, MD  Multiple Vitamin (MULTIVITAMIN WITH MINERALS) TABS tablet Take 1 tablet by mouth daily.    Historical Provider, MD  vitamin C (ASCORBIC ACID) 500 MG tablet Take 500 mg by mouth daily.    Historical Provider, MD   No Known Allergies  FAMILY HISTORY:  has no family status information on file.  SOCIAL HISTORY:  reports that she has never smoked. She does not have any smokeless tobacco history on file. She reports that she does not drink alcohol or use illicit drugs.  REVIEW OF SYSTEMS:  Unable to obtain secondary to patient condition.  SUBJECTIVE:   VITAL SIGNS: Temp:  [97.7 F (36.5 C)] 97.7 F (36.5 C) (06/25 1819) Pulse Rate:  [64-74] 74 (06/25 1850) Resp:  [12-25] 14 (06/25 1850) BP: (101-152)/(58-85) 141/85 mmHg (06/25 1850) SpO2:  [98 %-100 %] 99 % (06/25 1850) FiO2 (%):  [40 %] 40 % (06/25 2100) Weight:  [123 lb 7.3 oz (56 kg)]  123 lb 7.3 oz (56 kg) (06/25 1700) HEMODYNAMICS:   VENTILATOR SETTINGS: Vent Mode:  [-] PRVC FiO2 (%):  [40 %] 40 % Set Rate:  [14 bmp] 14 bmp Vt Set:  [500 mL] 500 mL PEEP:  [5 cmH20] 5 cmH20 Plateau Pressure:  [21 cmH20] 21 cmH20 INTAKE / OUTPUT:  Intake/Output Summary (Last 24 hours) at 11/20/14 2147 Last data filed at 11/20/14 2100  Gross per 24 hour  Intake   1000 ml  Output    200 ml  Net    800 ml    PHYSICAL EXAMINATION: General:  Sedated Neuro:  No purposeful movement, shivering HEENT:  Sclera anicteric, conjunctiva pink, MMM, ETT present Cardiovascular:  RRR, NS1/S2, (-) MRG Lungs:  CTAB Abdomen:   S/NT/ND/(+)BS Musculoskeletal:  Sheath in place, (-) C/C/E Skin:  Intact  LABS:  CBC  Recent Labs Lab 11/16/14 0153 11/20/14 1738 11/20/14 1748  WBC 9.6 5.3  --   HGB 14.0 13.0 13.6  HCT 41.6 39.5 40.0  PLT 169 173  --    Coag's  Recent Labs Lab 11/20/14 1738  APTT 27  INR 1.09   BMET  Recent Labs Lab 11/16/14 0153 11/20/14 1738 11/20/14 1748  NA 140 142 142  K 3.9 3.8 3.8  CL 102 107 106  CO2 30 23  --   BUN 16 22* 24*  CREATININE 0.87 0.76 0.80  GLUCOSE 117* 118* 117*   Electrolytes  Recent Labs Lab 11/16/14 0153 11/20/14 1738  CALCIUM 8.8* 8.9   Sepsis Markers No results for input(s): LATICACIDVEN, PROCALCITON, O2SATVEN in the last 168 hours. ABG No results for input(s): PHART, PCO2ART, PO2ART in the last 168 hours. Liver Enzymes  Recent Labs Lab 11/20/14 1738  AST 34  ALT 43  ALKPHOS 72  BILITOT 0.5  ALBUMIN 3.4*   Cardiac Enzymes No results for input(s): TROPONINI, PROBNP in the last 168 hours. Glucose  Recent Labs Lab 11/20/14 1753  GLUCAP 119*    Imaging Ct Head Wo Contrast  11/20/2014   CLINICAL DATA:  Stroke. Sudden onset left-sided weakness, left facial droop, and slurred speech. Status post cerebral angiogram and revascularization of occluded right MCA.  EXAM: CT HEAD WITHOUT CONTRAST  TECHNIQUE: Contiguous axial images were obtained from the base of the skull through the vertex without intravenous contrast.  COMPARISON:  Head CT earlier today  FINDINGS: Residual vascular contrast is noted from recent cerebral angiogram. Ventricles and sulci are normal. There is new, subtle decreased attenuation involving the right caudate and anterior right lentiform nuclei consistent with early cytotoxic edema. No definite cortical edema is identified. There is no evidence of acute intracranial hemorrhage, mass, midline shift, or extra-axial fluid collection.  Orbits are unremarkable. Mastoid air cells are clear. Trace left maxillary sinus  mucosal thickening is noted.  IMPRESSION: 1. Subtle edema in the right basal ganglia consistent with acute infarction. 2. No intracranial hemorrhage.   Electronically Signed   By: Sebastian Ache   On: 11/20/2014 21:34   Ct Head Wo Contrast  11/20/2014   CLINICAL DATA:  Code stroke, sudden onset left-sided weakness, left facial droop, slurred speech  EXAM: CT HEAD WITHOUT CONTRAST  TECHNIQUE: Contiguous axial images were obtained from the base of the skull through the vertex without intravenous contrast.  COMPARISON:  None.  FINDINGS: No evidence of parenchymal hemorrhage or extra-axial fluid collection. No mass lesion, mass effect, or midline shift.  No CT evidence of acute infarction.  Mild subcortical white matter and periventricular small vessel  ischemic changes.  Cerebral volume is within normal limits.  No ventriculomegaly.  The visualized paranasal sinuses are essentially clear. The mastoid air cells are unopacified.  No evidence of calvarial fracture.  IMPRESSION: No evidence of acute intracranial abnormality.  Mild small vessel ischemic changes.  These results were called by telephone at the time of interpretation on 11/20/2014 at 5:53 pm to Dr. Leroy Kennedy, who verbally acknowledged these results.   Electronically Signed   By: Charline Bills M.D.   On: 11/20/2014 17:53     ASSESSMENT / PLAN:  PULMONARY OETT Unclear, X-ray Pending A:  Ventilator Dependence Asthma Hx: Details not currently available but according to med list not on controller meds so likely mild.  P:   Lung Protective Ventilation VAP Prevention Per RN plan to sedate overnight, will defer SBT until cleared by primary team Chest X-ray Serial VBGs   CARDIOVASCULAR CVL None A:  Afib Presumed Tissue MVR P:  Defer to primary team  RENAL A:  No acute issue  GASTROINTESTINAL A:  No acute Issue  HEMATOLOGIC A:  No acute issue  INFECTIOUS A:  No acute issue  ENDOCRINE A:  Elevated Blood Glucose   P:   SSI A1c  ordered by primary team  NEUROLOGIC A:   CVA P:   RASS goal: 0 --> -2 Propofol and Fentanyl   FAMILY  - Updates: Will defer to primary team.  - Inter-disciplinary family meet or Palliative Care meeting due by:  7/2    TODAY'S SUMMARY:   CRITICAL CARE: The patient is critically ill with multiple organ systems failure and requires high complexity decision making for assessment and support, frequent evaluation and titration of therapies, application of advanced monitoring technologies and extensive interpretation of multiple databases. Critical Care Time devoted to patient care services described in this note is 30 minutes.  Evalyn Casco, MD Pulmonary and Critical Care Medicine [facility] Pager: [phone]  11/20/2014, 9:47 PM

## 2014-11-20 NOTE — ED Notes (Signed)
Pt arrived to IR

## 2014-11-20 NOTE — Progress Notes (Signed)
IA TPA ordered and pharmacy called at 1922, arrived at 1929 to IR2 and given to team.  NTG mixed and given to team.

## 2014-11-20 NOTE — Progress Notes (Signed)
Cerebral arteriogram complete, Dr. Corliss Skains proceeding and now v/o for 2G Ancef IV.  Removed and given to CRNA.

## 2014-11-20 NOTE — Anesthesia Postprocedure Evaluation (Signed)
  Anesthesia Post-op Note  Patient: Martha Clark  Procedure(s) Performed: Procedure(s): RADIOLOGY WITH ANESTHESIA (N/A)  Patient Location: ICU  Anesthesia Type:General  Level of Consciousness: sedated  Airway and Oxygen Therapy: Patient remains intubated per anesthesia plan  Post-op Pain: none  Post-op Assessment: Post-op Vital signs reviewed, Patient's Cardiovascular Status Stable, Respiratory Function Stable, Patent Airway and Pain level controlled              Post-op Vital Signs: Reviewed and stable  Last Vitals:  Filed Vitals:   11/20/14 1850  BP: 141/85  Pulse: 74  Temp:   Resp: 14    Complications: No apparent anesthesia complications

## 2014-11-20 NOTE — Anesthesia Preprocedure Evaluation (Addendum)
Anesthesia Evaluation  Patient identified by MRN, date of birth, ID bandPreop documentation limited or incomplete due to emergent nature of procedure.  Airway Mallampati: III  TM Distance: >3 FB Neck ROM: Full    Dental  (+) Teeth Intact   Pulmonary  breath sounds clear to auscultation        Cardiovascular Rhythm:Regular  H/o MVP s/p MVR with bioprosthetic 2008, recent aflutter cardioverted on 6/21   Neuro/Psych Left sided paralysis CVA    GI/Hepatic   Endo/Other    Renal/GU      Musculoskeletal   Abdominal   Peds  Hematology   Anesthesia Other Findings   Reproductive/Obstetrics                            Anesthesia Physical Anesthesia Plan  ASA: III and emergent  Anesthesia Plan: General   Post-op Pain Management:    Induction: Intravenous, Rapid sequence and Cricoid pressure planned  Airway Management Planned: Oral ETT  Additional Equipment: Arterial line  Intra-op Plan:   Post-operative Plan: Post-operative intubation/ventilation  Informed Consent:   Only emergency history available  Plan Discussed with: CRNA and Surgeon  Anesthesia Plan Comments:         Anesthesia Quick Evaluation

## 2014-11-20 NOTE — Transfer of Care (Signed)
Immediate Anesthesia Transfer of Care Note  Patient: [name]  Procedure(s) Performed: Procedure(s): RADIOLOGY WITH ANESTHESIA (N/A)  Patient Location: NICU  Anesthesia Type:General  Level of Consciousness: Patient remains intubated per anesthesia plan  Airway & Oxygen Therapy: Patient remains intubated per anesthesia plan and Patient placed on Ventilator (see vital sign flow sheet for setting)  Post-op Assessment: Report given to RN and Post -op Vital signs reviewed and stable  Post vital signs: Reviewed and stable  Last Vitals:  Filed Vitals:   11/20/14 1850  BP: 141/85  Pulse: 74  Temp:   Resp: 14    Complications: No apparent anesthesia complications

## 2014-11-20 NOTE — ED Notes (Signed)
Pt presents from home via [facility] with c/o left sided paralysis and left facial droop with dysarthriat, LSN 1655 with witness decline. Pt did fall to her buttocks with no reported injury. Family at bedside.

## 2014-11-20 NOTE — Consult Note (Addendum)
Referring Physician: ED    Chief Complaint: left hemiparesis, dysarthria, left face weakness  HPI:                                                                                                                                         Martha Clark is an 59 y.o. female with a past medical history significant for MVP s/p bovine mitral valve replacement, newly diagnosed atrial fibrillation s/p cardioversion in he ER on 6/21 , osteoporosis, and asthma, brought in via EMS due to acute onset of the above stated symptoms. Patient was home with family when she fell and called out for her family in the other room and noted patient to have left side paralysis with facial droop and slurred speech. EMS was immediately summoned and patient brought to the ED. Awake and alert at initial evaluation, with NIHSS 15 and CT scan that was personally reviewed and showed no acute abnormality. She denies HA, vertigo, double vision, difficulty swallowing, or visual disturbances. No recent bleeding or surgeries. Patient is not taking anticoagulants.  Date last known well: 11/21/13 Time last known well: 1655  tPA Given: yes  NIHSS: 15   Past Medical History  Diagnosis Date  . Atrial fibrillation   . Asthma   . Osteoporosis   . Mitral valve prolapse     Past Surgical History  Procedure Laterality Date  . Tubal ligation    . Mitral valve replacement  2008    Family History  Problem Relation Age of Onset  . Diabetes Father   . Stroke Father   . Hypertension Sister   . Cancer Paternal Uncle     lung cancer  . Breast cancer Maternal Grandmother 56   Social History:  reports that she has never smoked. She does not have any smokeless tobacco history on file. She reports that she does not drink alcohol or use illicit drugs.  Family history: no brain tumors, epilepsy, or brain aneurysms  Allergies: No Known Allergies  Medications:                                                                                                                            I have reviewed the patient's current medications.  ROS:  History obtained from family, chart review and the patient  General ROS: negative for - chills, fatigue, fever, night sweats, weight gain or weight loss Psychological ROS: negative for - behavioral disorder, hallucinations, memory difficulties, mood swings or suicidal ideation Ophthalmic ROS: negative for - blurry vision, double vision, eye pain or loss of vision ENT ROS: negative for - epistaxis, nasal discharge, oral lesions, sore throat, tinnitus or vertigo Allergy and Immunology ROS: negative for - hives or itchy/watery eyes Hematological and Lymphatic ROS: negative for - bleeding problems, bruising or swollen lymph nodes Endocrine ROS: negative for - galactorrhea, hair pattern changes, polydipsia/polyuria or temperature intolerance Respiratory ROS: negative for - cough, hemoptysis, shortness of breath or wheezing Cardiovascular ROS: negative for - chest pain, dyspnea on exertion, edema or irregular heartbeat Gastrointestinal ROS: negative for - abdominal pain, diarrhea, hematemesis, nausea/vomiting or stool incontinence Genito-Urinary ROS: negative for - dysuria, hematuria, incontinence or urinary frequency/urgency Musculoskeletal ROS: negative for - joint swelling Neurological ROS: as noted in HPI Dermatological ROS: negative for rash and skin lesion changes  Physical exam: pleasant female in no apparent distress. Blood pressure 146/84, pulse 65, temperature 97.7 F (36.5 C), resp. rate 18, weight 56 kg (123 lb 7.3 oz), SpO2 100 %. Head: normocephalic. Neck: supple, no bruits, no JVD. Cardiac: no murmurs. Lungs: clear. Abdomen: soft, no tender, no mass. Extremities: no edema. Skin: no rash Neurologic Examination:                                                                                                       General: Mental Status: Alert, oriented, thought content appropriate.  Mild dysarthria without evidence of aphasia.  Able to follow 3 step commands without difficulty. Cranial Nerves: II: Discs flat bilaterally; Visual fields grossly normal, pupils equal, round, reactive to light and accommodation III,IV, VI: ptosis not present, extra-ocular motions intact bilaterally V,VII: smile asymmetric due to left face weakness, facial light touch sensation normal bilaterally VIII: hearing normal bilaterally IX,X: uvula rises symmetrically XI: bilateral shoulder shrug XII: midline tongue extension without atrophy or fasciculations Motor: Significant for dense left hemiplegia Tone and bulk:normal tone throughout; no atrophy noted Sensory: Pinprick and light touch diminished in the left side Deep Tendon Reflexes:  Right: Upper Extremity   Left: Upper extremity   biceps (C-5 to C-6) 2/4   biceps (C-5 to C-6) 2/4 tricep (C7) 2/4    triceps (C7) 2/4 Brachioradialis (C6) 2/4  Brachioradialis (C6) 2/4  Lower Extremity Lower Extremity  quadriceps (L-2 to L-4) 2/4   quadriceps (L-2 to L-4) 2/4 Achilles (S1) 2/4   Achilles (S1) 2/4  Plantars: Right: downgoing   Left: downgoing Cerebellar: normal finger-to-nose,  normal heel-to-shin test in the right. Can not perform in the left due to weakness. Gait:  Unable to test due to multiple leads and safety reasons.    Results for orders placed or performed during the hospital encounter of 11/20/14 (from the past 48 hour(s))  Protime-INR     Status: None   Collection Time: 11/20/14  5:38 PM  Result Value Ref Range  Prothrombin Time 14.3 11.6 - 15.2 seconds   INR 1.09 0.00 - 1.49  APTT     Status: None   Collection Time: 11/20/14  5:38 PM  Result Value Ref Range   aPTT 27 24 - 37 seconds  CBC     Status: None   Collection Time: 11/20/14  5:38 PM  Result Value Ref Range   WBC 5.3 4.0  - 10.5 K/uL   RBC 4.59 3.87 - 5.11 MIL/uL   Hemoglobin 13.0 12.0 - 15.0 g/dL   HCT 01.0 27.2 - 53.6 %   MCV 86.1 78.0 - 100.0 fL   MCH 28.3 26.0 - 34.0 pg   MCHC 32.9 30.0 - 36.0 g/dL   RDW 64.4 03.4 - 74.2 %   Platelets 173 150 - 400 K/uL  Differential     Status: None   Collection Time: 11/20/14  5:38 PM  Result Value Ref Range   Neutrophils Relative % 50 43 - 77 %   Neutro Abs 2.7 1.7 - 7.7 K/uL   Lymphocytes Relative 37 12 - 46 %   Lymphs Abs 2.0 0.7 - 4.0 K/uL   Monocytes Relative 8 3 - 12 %   Monocytes Absolute 0.4 0.1 - 1.0 K/uL   Eosinophils Relative 4 0 - 5 %   Eosinophils Absolute 0.2 0.0 - 0.7 K/uL   Basophils Relative 1 0 - 1 %   Basophils Absolute 0.0 0.0 - 0.1 K/uL  I-stat troponin, ED (not at [facility], [facility])     Status: None   Collection Time: 11/20/14  5:46 PM  Result Value Ref Range   Troponin i, poc 0.03 0.00 - 0.08 ng/mL   Comment 3            Comment: Due to the release kinetics of cTnI, a negative result within the first hours of the onset of symptoms does not rule out myocardial infarction with certainty. If myocardial infarction is still suspected, repeat the test at appropriate intervals.   I-Stat Chem 8, ED  (not at [facility], [facility])     Status: Abnormal   Collection Time: 11/20/14  5:48 PM  Result Value Ref Range   Sodium 142 135 - 145 mmol/L   Potassium 3.8 3.5 - 5.1 mmol/L   Chloride 106 101 - 111 mmol/L   BUN 24 (H) 6 - 20 mg/dL   Creatinine, Ser 5.95 0.44 - 1.00 mg/dL   Glucose, Bld 638 (H) 65 - 99 mg/dL   Calcium, Ion 7.56 (L) 1.12 - 1.23 mmol/L   TCO2 26 0 - 100 mmol/L   Hemoglobin 13.6 12.0 - 15.0 g/dL   HCT 43.3 29.5 - 18.8 %  CBG monitoring, ED     Status: Abnormal   Collection Time: 11/20/14  5:53 PM  Result Value Ref Range   Glucose-Capillary 119 (H) 65 - 99 mg/dL   Ct Head Wo Contrast  11/20/2014   CLINICAL DATA:  Code stroke, sudden onset left-sided weakness, left facial droop, slurred speech  EXAM: CT HEAD WITHOUT CONTRAST  TECHNIQUE:  Contiguous axial images were obtained from the base of the skull through the vertex without intravenous contrast.  COMPARISON:  None.  FINDINGS: No evidence of parenchymal hemorrhage or extra-axial fluid collection. No mass lesion, mass effect, or midline shift.  No CT evidence of acute infarction.  Mild subcortical white matter and periventricular small vessel ischemic changes.  Cerebral volume is within normal limits.  No ventriculomegaly.  The visualized paranasal sinuses are essentially clear. The mastoid  air cells are unopacified.  No evidence of calvarial fracture.  IMPRESSION: No evidence of acute intracranial abnormality.  Mild small vessel ischemic changes.  These results were called by telephone at the time of interpretation on 11/20/2014 at 5:53 pm to Dr. Leroy Kennedy, who verbally acknowledged these results.   Electronically Signed   By: Charline Bills M.D.   On: 11/20/2014 17:53    Assessment: 59 y.o. female with newly diagnosed atrial fibrillation s/p cardioversion, comes in with a constellation of symptoms and signs consistent with right hemispheric stroke. On sinus rhythm but still concern that this stroke most likely due to cardio-embolism to the right MCA. NIHSS 15, probable large artery clot/thrombus. Patient meet criteria for IV thrombolysis and this administered accordingly. Then, spoke to interventional neuro-radiologist for who will take patient to the interventional suite to search for a clot/throbus amenable to endovascular treatment. Patient and family updated and they agreed to pursue that pathway. Admit to NICU and complete stroke work up. Stroke team will follow up tomorrow.   Stroke Risk Factors - atrial fibrillation  Plan: 1. HgbA1c, fasting lipid panel 2. MRI brain 3. Echocardiogram 4. Carotid dopplers 5. Prophylactic therapy-as per protocol 6. Risk factor modification 7. Telemetry monitoring 8. Frequent neuro checks 9. PT/OT SLP 10. NPO   Wyatt Portela, MD [facility] [phone]  11/20/2014, 6:27 PM

## 2014-11-20 NOTE — Anesthesia Procedure Notes (Signed)
Procedure Name: Intubation Date/Time: 11/20/2014 7:31 PM Performed by: Molli Hazard Pre-anesthesia Checklist: Patient identified, Emergency Drugs available, Suction available and Patient being monitored Patient Re-evaluated:Patient Re-evaluated prior to inductionOxygen Delivery Method: Circle system utilized Preoxygenation: Pre-oxygenation with 100% oxygen Intubation Type: IV induction, Rapid sequence and Cricoid Pressure applied Laryngoscope Size: Miller and 2 Grade View: Grade II Tube type: Subglottic suction tube Tube size: 7.5 mm Number of attempts: 1 Airway Equipment and Method: Stylet Placement Confirmation: ETT inserted through vocal cords under direct vision,  positive ETCO2 and breath sounds checked- equal and bilateral Secured at: 22 cm Tube secured with: Tape Dental Injury: Teeth and Oropharynx as per pre-operative assessment

## 2014-11-20 NOTE — Progress Notes (Signed)
Called ED and s/w Dr. Romeo Apple regarding inpt bed for pt. Called Dr. Thad Ranger x 2 for callback to IR regarding need to bed order.

## 2014-11-20 NOTE — Progress Notes (Signed)
Report received from [place], ED RN who stated pt fell at home in front of family at 24. To hospital w/ left sided paralysis, left facial droop, and dysarthria. Given TPA in ED which ended in IR 2 at 1914.  She stated last neuro check was 1850. Pt on table now undergoing cerebral arteriogram w/ CRNA present. First puncture now at 1917. Family in waiting area.

## 2014-11-20 NOTE — ED Provider Notes (Signed)
CSN: [id number]     Arrival date & time 11/20/14  1734 History   First MD Initiated Contact with Patient 11/20/14 1749     No chief complaint on file.    (Consider location/radiation/quality/duration/timing/severity/associated sxs/prior Treatment) Patient is a 59 y.o. female presenting with Acute Neurological Problem. The history is provided by the patient.  Cerebrovascular Accident This is a new problem. The current episode started 1 to 2 hours ago. The problem occurs constantly. The problem has not changed since onset.Pertinent negatives include no chest pain, no abdominal pain, no headaches and no shortness of breath. Nothing aggravates the symptoms. Nothing relieves the symptoms. She has tried nothing for the symptoms. The treatment provided no relief.    Past Medical History  Diagnosis Date  . Atrial fibrillation   . Asthma   . Osteoporosis   . Mitral valve prolapse    Past Surgical History  Procedure Laterality Date  . Tubal ligation    . Mitral valve replacement  2008   Family History  Problem Relation Age of Onset  . Diabetes Father   . Stroke Father   . Hypertension Sister   . Cancer Paternal Uncle     lung cancer  . Breast cancer Maternal Grandmother 37   History  Substance Use Topics  . Smoking status: Never Smoker   . Smokeless tobacco: Not on file  . Alcohol Use: No   OB History    Gravida Para Term Preterm AB TAB SAB Ectopic Multiple Living   1 1 1       2      Review of Systems  Constitutional: Negative for fever and fatigue.  HENT: Negative for congestion and drooling.   Eyes: Negative for pain.  Respiratory: Negative for cough and shortness of breath.   Cardiovascular: Negative for chest pain.  Gastrointestinal: Negative for nausea, vomiting, abdominal pain and diarrhea.  Genitourinary: Negative for dysuria and hematuria.  Musculoskeletal: Negative for back pain, gait problem and neck pain.  Skin: Negative for color change.  Neurological:  Negative for dizziness and headaches.  Hematological: Negative for adenopathy.  Psychiatric/Behavioral: Negative for behavioral problems.  All other systems reviewed and are negative.     Allergies  Review of patient's allergies indicates no known allergies.  Home Medications   Prior to Admission medications   Medication Sig Start Date End Date Taking? Authorizing Provider  acetaminophen (TYLENOL) 325 MG tablet Take 1,300 mg by mouth every 6 (six) hours as needed for mild pain.    Historical Provider, MD  aspirin 81 MG tablet Take 81 mg by mouth daily.      Historical Provider, MD  buPROPion (WELLBUTRIN XL) 150 MG 24 hr tablet Take 1 capsule by mouth Daily. 11/25/10   Historical Provider, MD  calcium citrate-vitamin D (CITRACAL+D) 315-200 MG-UNIT per tablet Take 1 tablet by mouth daily.      Historical Provider, MD  Cholecalciferol (VITAMIN D) 1000 UNITS capsule Take 1,000 Units by mouth daily.      Historical Provider, MD  Coenzyme Q10 (COQ-10) 100 MG CAPS Take 1 tablet by mouth daily.      Historical Provider, MD  Fish Oil OIL Take 15 mLs by mouth daily.     Historical Provider, MD  flecainide (TAMBOCOR) 50 MG tablet Take 0.5 tablets (25 mg total) by mouth 2 (two) times daily. 11/16/14   Yates Decamp, MD  MAGNESIUM CHLORIDE PO Take 1 tablet by mouth daily.      Historical Provider, MD  metoprolol succinate (  TOPROL XL) 25 MG 24 hr tablet Take 1 tablet (25 mg total) by mouth daily. 11/16/14   Yates Decamp, MD  Multiple Vitamin (MULTIVITAMIN WITH MINERALS) TABS tablet Take 1 tablet by mouth daily.    Historical Provider, MD  vitamin C (ASCORBIC ACID) 500 MG tablet Take 500 mg by mouth daily.    Historical Provider, MD   Wt 123 lb 7.3 oz (56 kg) Physical Exam  Constitutional: She is oriented to person, place, and time. She appears well-developed and well-nourished.  HENT:  Head: Normocephalic and atraumatic.  Mouth/Throat: Oropharynx is clear and moist. No oropharyngeal exudate.  Eyes:  Conjunctivae and EOM are normal. Pupils are equal, round, and reactive to light.  Neck: Normal range of motion. Neck supple.  No vertebral ttp.   Cardiovascular: Normal rate, regular rhythm, normal heart sounds and intact distal pulses.  Exam reveals no gallop and no friction rub.   No murmur heard. Pulmonary/Chest: Effort normal and breath sounds normal. No respiratory distress. She has no wheezes.  Abdominal: Soft. Bowel sounds are normal. There is no tenderness. There is no rebound and no guarding.  Musculoskeletal: Normal range of motion. She exhibits no edema or tenderness.  No hip pain noted.   Neurological: She is alert and oriented to person, place, and time.  alert, oriented x3 memory: intact grossly motor strength: full proximally and distally in the RUE/RLE, unable to lift LUE/LLE sensation: intact to light touch diffusely    Skin: Skin is warm and dry.  Psychiatric: She has a normal mood and affect. Her behavior is normal.  Nursing note and vitals reviewed.   ED Course  Procedures (including critical care time) Labs Review Labs Reviewed  URINALYSIS, ROUTINE W REFLEX MICROSCOPIC (NOT AT [facility]) - Abnormal; Notable for the following:    Hgb urine dipstick SMALL (*)    Ketones, ur 15 (*)    All other components within normal limits  URINE MICROSCOPIC-ADD ON - Abnormal; Notable for the following:    Squamous Epithelial / LPF FEW (*)    Bacteria, UA FEW (*)    All other components within normal limits  I-STAT CHEM 8, ED - Abnormal; Notable for the following:    BUN 24 (*)    Glucose, Bld 117 (*)    Calcium, Ion 1.10 (*)    All other components within normal limits  CBG MONITORING, ED - Abnormal; Notable for the following:    Glucose-Capillary 119 (*)    All other components within normal limits  PROTIME-INR  APTT  CBC  DIFFERENTIAL  COMPREHENSIVE METABOLIC PANEL  HEMOGLOBIN A1C  LIPID PANEL  I-STAT TROPOININ, ED    Imaging Review Ct Head Wo  Contrast  11/20/2014   CLINICAL DATA:  Code stroke, sudden onset left-sided weakness, left facial droop, slurred speech  EXAM: CT HEAD WITHOUT CONTRAST  TECHNIQUE: Contiguous axial images were obtained from the base of the skull through the vertex without intravenous contrast.  COMPARISON:  None.  FINDINGS: No evidence of parenchymal hemorrhage or extra-axial fluid collection. No mass lesion, mass effect, or midline shift.  No CT evidence of acute infarction.  Mild subcortical white matter and periventricular small vessel ischemic changes.  Cerebral volume is within normal limits.  No ventriculomegaly.  The visualized paranasal sinuses are essentially clear. The mastoid air cells are unopacified.  No evidence of calvarial fracture.  IMPRESSION: No evidence of acute intracranial abnormality.  Mild small vessel ischemic changes.  These results were called by telephone at the time  of interpretation on 11/20/2014 at 5:53 pm to Dr. Leroy Kennedy, who verbally acknowledged these results.   Electronically Signed   By: Charline Bills M.D.   On: 11/20/2014 17:53     EKG Interpretation   Date/Time:  Saturday November 20 2014 18:30:28 EDT Ventricular Rate:  70 PR Interval:  196 QRS Duration: 134 QT Interval:  462 QTC Calculation: 499 R Axis:   -33 Text Interpretation:  Sinus rhythm Probable left atrial enlargement IVCD,  consider atypical RBBB Left ventricular hypertrophy Anterior Q waves,  possibly due to LVH Artifact in lead(s) I aVL V2 V3 Confirmed by Kemoni Ortega   MD, Dewayne Severe ([id number]) on 11/20/2014 6:41:36 PM     CRITICAL CARE Performed by: Purvis Sheffield, S Total critical care time: 30 min Critical care time was exclusive of separately billable procedures and treating other patients. Critical care was necessary to treat or prevent imminent or life-threatening deterioration. Critical care was time spent personally by me on the following activities: development of treatment plan with patient and/or surrogate as  well as nursing, discussions with consultants, evaluation of patient's response to treatment, examination of patient, obtaining history from patient or surrogate, ordering and performing treatments and interventions, ordering and review of laboratory studies, ordering and review of radiographic studies, pulse oximetry and re-evaluation of patient's condition.  MDM   Final diagnoses:  Stroke  Stroke    6:09 PM 59 y.o. female w hx of afib s/p recent cardioversion in the ED pw left sided weakness and fall which occurred this evening at 16:55. Sx sudden in onset. Pt denies any pain. No obvious injury. Neurology has evaluated. Pt getting tpa now. VSS. Will monitor closely.   Pt remained stable in the ED. Neuro to admit.   Purvis Sheffield, MD 11/20/14 1925

## 2014-11-20 NOTE — Procedures (Signed)
S/P RT common carotid arteriogram,folloed by complete revascularization of occluded RTY MCA with 5mg  of superselective TPA and x1 pass with the [place] FR 48mm x 40 mm retrieval device  With TICI 3 revascularization

## 2014-11-20 NOTE — Progress Notes (Signed)
Code stroke called 1719, patient arrived to [facility] ED via [facility] at 1734.  As per patient, was at home when she fell and called out for her family in the other room and noted patient to have left side paralysis with facial droop and slurred speech.  LSN 1655, NIHSS 15.  Pharmacy notified for TPA at 1750 and delivered to bedside at 1757.  Foley inserted by ED RN, and 2 PIVs started prior to administration of TPA.  TPA started at 1810.  IR team notified of patient.

## 2014-11-21 ENCOUNTER — Inpatient Hospital Stay (HOSPITAL_COMMUNITY): Payer: BLUE CROSS/BLUE SHIELD

## 2014-11-21 DIAGNOSIS — E876 Hypokalemia: Secondary | ICD-10-CM

## 2014-11-21 DIAGNOSIS — I639 Cerebral infarction, unspecified: Secondary | ICD-10-CM

## 2014-11-21 DIAGNOSIS — J9600 Acute respiratory failure, unspecified whether with hypoxia or hypercapnia: Secondary | ICD-10-CM

## 2014-11-21 DIAGNOSIS — I6789 Other cerebrovascular disease: Secondary | ICD-10-CM

## 2014-11-21 LAB — CBC WITH DIFFERENTIAL/PLATELET
Basophils Absolute: 0 10*3/uL (ref 0.0–0.1)
Basophils Relative: 0 % (ref 0–1)
Eosinophils Absolute: 0.1 10*3/uL (ref 0.0–0.7)
Eosinophils Relative: 1 % (ref 0–5)
HCT: 34.9 % — ABNORMAL LOW (ref 36.0–46.0)
Hemoglobin: 11.8 g/dL — ABNORMAL LOW (ref 12.0–15.0)
Lymphocytes Relative: 17 % (ref 12–46)
Lymphs Abs: 1.3 10*3/uL (ref 0.7–4.0)
MCH: 28.6 pg (ref 26.0–34.0)
MCHC: 33.8 g/dL (ref 30.0–36.0)
MCV: 84.5 fL (ref 78.0–100.0)
Monocytes Absolute: 0.5 10*3/uL (ref 0.1–1.0)
Monocytes Relative: 7 % (ref 3–12)
Neutro Abs: 5.9 10*3/uL (ref 1.7–7.7)
Neutrophils Relative %: 75 % (ref 43–77)
Platelets: 160 10*3/uL (ref 150–400)
RBC: 4.13 MIL/uL (ref 3.87–5.11)
RDW: 12.4 % (ref 11.5–15.5)
WBC: 7.8 10*3/uL (ref 4.0–10.5)

## 2014-11-21 LAB — LIPID PANEL
Cholesterol: 127 mg/dL (ref 0–200)
HDL: 38 mg/dL — ABNORMAL LOW (ref >40)
LDL Cholesterol: 76 mg/dL (ref 0–99)
Total CHOL/HDL Ratio: 3.3 RATIO
Triglycerides: 65 mg/dL (ref <150)
VLDL: 13 mg/dL (ref 0–40)

## 2014-11-21 LAB — GLUCOSE, CAPILLARY
Glucose-Capillary: 105 mg/dL — ABNORMAL HIGH (ref 65–99)
Glucose-Capillary: 111 mg/dL — ABNORMAL HIGH (ref 65–99)
Glucose-Capillary: 129 mg/dL — ABNORMAL HIGH (ref 65–99)
Glucose-Capillary: 187 mg/dL — ABNORMAL HIGH (ref 65–99)
Glucose-Capillary: 62 mg/dL — ABNORMAL LOW (ref 65–99)
Glucose-Capillary: 90 mg/dL (ref 65–99)
Glucose-Capillary: 95 mg/dL (ref 65–99)

## 2014-11-21 LAB — BASIC METABOLIC PANEL
Anion gap: 5 (ref 5–15)
BUN: 9 mg/dL (ref 6–20)
CO2: 24 mmol/L (ref 22–32)
Calcium: 8.1 mg/dL — ABNORMAL LOW (ref 8.9–10.3)
Chloride: 108 mmol/L (ref 101–111)
Creatinine, Ser: 0.62 mg/dL (ref 0.44–1.00)
GFR calc Af Amer: >60 mL/min (ref >60)
GFR calc non Af Amer: >60 mL/min (ref >60)
Glucose, Bld: 144 mg/dL — ABNORMAL HIGH (ref 65–99)
Potassium: 3.4 mmol/L — ABNORMAL LOW (ref 3.5–5.1)
Sodium: 137 mmol/L (ref 135–145)

## 2014-11-21 LAB — BLOOD GAS, ARTERIAL
Acid-base deficit: 4.8 mmol/L — ABNORMAL HIGH (ref 0.0–2.0)
Bicarbonate: 18.1 mEq/L — ABNORMAL LOW (ref 20.0–24.0)
Drawn by: 28340
FIO2: 0.4 %
MECHVT: 400 mL
O2 Saturation: 99.6 %
PEEP: 5 cmH2O
Patient temperature: 98.6
RATE: 20 resp/min
TCO2: 18.9 mmol/L (ref 0–100)
pCO2 arterial: 24.9 mmHg — ABNORMAL LOW (ref 35.0–45.0)
pH, Arterial: 7.476 — ABNORMAL HIGH (ref 7.350–7.450)
pO2, Arterial: 182 mmHg — ABNORMAL HIGH (ref 80.0–100.0)

## 2014-11-21 MED ORDER — DEXTROSE 50 % IV SOLN
INTRAVENOUS | Status: AC
  Filled 2014-11-21: qty 50

## 2014-11-21 MED ORDER — DILTIAZEM LOAD VIA INFUSION
10.0000 mg | Freq: Once | INTRAVENOUS | Status: AC
  Administered 2014-11-21: 10 mg via INTRAVENOUS
  Filled 2014-11-21: qty 10

## 2014-11-21 MED ORDER — POTASSIUM CHLORIDE CRYS ER 20 MEQ PO TBCR
40.0000 meq | EXTENDED_RELEASE_TABLET | Freq: Three times a day (TID) | ORAL | Status: AC
  Filled 2014-11-21: qty 2

## 2014-11-21 MED ORDER — DILTIAZEM HCL 100 MG IV SOLR
5.0000 mg/h | INTRAVENOUS | Status: DC
  Administered 2014-11-21 (×2): 5 mg/h via INTRAVENOUS
  Administered 2014-11-22: 7.5 mg/h via INTRAVENOUS
  Filled 2014-11-21 (×2): qty 100

## 2014-11-21 MED ORDER — ASPIRIN 300 MG RE SUPP
300.0000 mg | Freq: Once | RECTAL | Status: AC
  Administered 2014-11-21: 300 mg via RECTAL
  Filled 2014-11-21: qty 1

## 2014-11-21 MED ORDER — METOPROLOL TARTRATE 1 MG/ML IV SOLN
5.0000 mg | Freq: Four times a day (QID) | INTRAVENOUS | Status: DC
  Administered 2014-11-21: 5 mg via INTRAVENOUS
  Filled 2014-11-21: qty 5

## 2014-11-21 MED ORDER — DEXTROSE 50 % IV SOLN
25.0000 mL | Freq: Once | INTRAVENOUS | Status: AC
  Administered 2014-11-21: 25 mL via INTRAVENOUS

## 2014-11-21 NOTE — Progress Notes (Signed)
Patient had 1 episode of emesis and then went into A-fib s/p emesis. Approximately 75 ml of thin clear liquid and heart rate sustained in the low 100's with a max HR of 130. The patient had no clinical changes that were assessed. Zofran was given and Neurologist on call as well as CCM Md was notified. Neurology Md gave no new orders. CCM Md gave new order. RN will carry out the new order and will continue to monitor.

## 2014-11-21 NOTE — Evaluation (Signed)
SLP Cancellation Note  Patient Details Name: Martha Clark MRN: [id number] DOB: 08-14-1955   Cancelled treatment:       Reason Eval/Treat Not Completed: Other (comment) (pt currently lethargic and was just extubated at noon today, spoke to [employer] and requested she page SLP today if pt awakens adequately for po; otherwise will plan to see pt next date.  Thanks for the order.)   Donavan Burnet, MS [facility] SLP [phone]

## 2014-11-21 NOTE — Progress Notes (Signed)
Echocardiogram 2D Echocardiogram has been performed.  Martha Clark 11/21/2014, 3:41 PM

## 2014-11-21 NOTE — Progress Notes (Signed)
At 1023 the 9 fr sheath removed from right groin post tibial pulse verification. Manual pressure applied until 1047 and hemostasis achieved. Right groin dressed with tegadurm and gauze. Right tibial pulses present. At 1053 the 89fr sheath removed from left groin post tibial pulse verification. Manual pressure applied until 1010 hemostasis achieved. Left groin dressed with tegadurm and guaze. Left tibial pulses present.

## 2014-11-21 NOTE — Progress Notes (Signed)
Hypoglycemic Event  CBG: 62  Treatment: 25 ml of Dextrose D 50   Symptoms: none  Follow-up CBG: Time: 0355 CBG Result:187  Possible Reasons for Event: pt npo after procedure  Comments/MD notified: Notified Waymon Budge, MD     Lily Peer R  Remember to initiate Hypoglycemia Order Set & complete

## 2014-11-21 NOTE — Consult Note (Signed)
Reason for Consult: A. fib with RVR Referring Physician: CCM  ASUCENA GALER is an 59 y.o. female.  HPI: Patient is 59 year old female with past medical history significant for mitral valve prolapse status post bovine MVR in 2008 at [facility], history of paroxysmal atrial fibrillation status post cardioversion on 11/16/2014, history of bronchial asthma, osteoporosis, was admitted yesterday as patient developed sudden onset of left hemiparesis with dysarthria and left facial weakness patient received IV thrombolytic therapy followed by cerebral angiogram and local TPA and Solitaire retrieval device withTICI 3 distal flow with resolution of weakness. Cardiologic consultation is called as patient went back into A. fib with RVR  this afternoon. Her received one dose of IV 5 mg Lopressor without much improvement in her heart rate. Patient denies any chest pain palpitations or shortness of breath. Denies history of taking any anticoagulations in the past except for aspirin. Patient has no history of CHF hypertension diabetes or stroke or any vascular disease. MRI of the brain done showed right basal ganglion infarct without hemorrhagIc transformation but showed acute subarachnoid hemorrhage and prepontine and suprasellar cistern which appears to be chronic microbleed's..  Past Medical History  Diagnosis Date  . Atrial fibrillation   . Asthma   . Osteoporosis   . Mitral valve prolapse     Past Surgical History  Procedure Laterality Date  . Tubal ligation    . Mitral valve replacement  2008    Family History  Problem Relation Age of Onset  . Diabetes Father   . Stroke Father   . Hypertension Sister   . Cancer Paternal Uncle     lung cancer  . Breast cancer Maternal Grandmother 43    Social History:  reports that she has never smoked. She does not have any smokeless tobacco history on file. She reports that she does not drink alcohol or use illicit drugs.  Allergies: No Known  Allergies  Medications: I have reviewed the patient's current medications.  Results for orders placed or performed during the hospital encounter of 11/20/14 (from the past 48 hour(s))  Protime-INR     Status: None   Collection Time: 11/20/14  5:38 PM  Result Value Ref Range   Prothrombin Time 14.3 11.6 - 15.2 seconds   INR 1.09 0.00 - 1.49  APTT     Status: None   Collection Time: 11/20/14  5:38 PM  Result Value Ref Range   aPTT 27 24 - 37 seconds  CBC     Status: None   Collection Time: 11/20/14  5:38 PM  Result Value Ref Range   WBC 5.3 4.0 - 10.5 K/uL   RBC 4.59 3.87 - 5.11 MIL/uL   Hemoglobin 13.0 12.0 - 15.0 g/dL   HCT 39.5 36.0 - 46.0 %   MCV 86.1 78.0 - 100.0 fL   MCH 28.3 26.0 - 34.0 pg   MCHC 32.9 30.0 - 36.0 g/dL   RDW 12.5 11.5 - 15.5 %   Platelets 173 150 - 400 K/uL  Differential     Status: None   Collection Time: 11/20/14  5:38 PM  Result Value Ref Range   Neutrophils Relative % 50 43 - 77 %   Neutro Abs 2.7 1.7 - 7.7 K/uL   Lymphocytes Relative 37 12 - 46 %   Lymphs Abs 2.0 0.7 - 4.0 K/uL   Monocytes Relative 8 3 - 12 %   Monocytes Absolute 0.4 0.1 - 1.0 K/uL   Eosinophils Relative 4 0 - 5 %  Eosinophils Absolute 0.2 0.0 - 0.7 K/uL   Basophils Relative 1 0 - 1 %   Basophils Absolute 0.0 0.0 - 0.1 K/uL  Comprehensive metabolic panel     Status: Abnormal   Collection Time: 11/20/14  5:38 PM  Result Value Ref Range   Sodium 142 135 - 145 mmol/L   Potassium 3.8 3.5 - 5.1 mmol/L   Chloride 107 101 - 111 mmol/L   CO2 23 22 - 32 mmol/L   Glucose, Bld 118 (H) 65 - 99 mg/dL   BUN 22 (H) 6 - 20 mg/dL   Creatinine, Ser 0.76 0.44 - 1.00 mg/dL   Calcium 8.9 8.9 - 10.3 mg/dL   Total Protein 5.9 (L) 6.5 - 8.1 g/dL   Albumin 3.4 (L) 3.5 - 5.0 g/dL   AST 34 15 - 41 U/L   ALT 43 14 - 54 U/L   Alkaline Phosphatase 72 38 - 126 U/L   Total Bilirubin 0.5 0.3 - 1.2 mg/dL   GFR calc non Af Amer >60 >60 mL/min   GFR calc Af Amer >60 >60 mL/min    Comment: (NOTE) The  eGFR has been calculated using the CKD EPI equation. This calculation has not been validated in all clinical situations. eGFR's persistently <60 mL/min signify possible Chronic Kidney Disease.    Anion gap 12 5 - 15  I-stat troponin, ED (not at [facility], [facility])     Status: None   Collection Time: 11/20/14  5:46 PM  Result Value Ref Range   Troponin i, poc 0.03 0.00 - 0.08 ng/mL   Comment 3            Comment: Due to the release kinetics of cTnI, a negative result within the first hours of the onset of symptoms does not rule out myocardial infarction with certainty. If myocardial infarction is still suspected, repeat the test at appropriate intervals.   I-Stat Chem 8, ED  (not at [facility], [facility])     Status: Abnormal   Collection Time: 11/20/14  5:48 PM  Result Value Ref Range   Sodium 142 135 - 145 mmol/L   Potassium 3.8 3.5 - 5.1 mmol/L   Chloride 106 101 - 111 mmol/L   BUN 24 (H) 6 - 20 mg/dL   Creatinine, Ser 0.80 0.44 - 1.00 mg/dL   Glucose, Bld 117 (H) 65 - 99 mg/dL   Calcium, Ion 1.10 (L) 1.12 - 1.23 mmol/L   TCO2 26 0 - 100 mmol/L   Hemoglobin 13.6 12.0 - 15.0 g/dL   HCT 40.0 36.0 - 46.0 %  CBG monitoring, ED     Status: Abnormal   Collection Time: 11/20/14  5:53 PM  Result Value Ref Range   Glucose-Capillary 119 (H) 65 - 99 mg/dL  Urinalysis, Routine w reflex microscopic (not at [facility])     Status: Abnormal   Collection Time: 11/20/14  6:35 PM  Result Value Ref Range   Color, Urine YELLOW YELLOW   APPearance CLEAR CLEAR   Specific Gravity, Urine 1.027 1.005 - 1.030   pH 7.0 5.0 - 8.0   Glucose, UA NEGATIVE NEGATIVE mg/dL   Hgb urine dipstick SMALL (A) NEGATIVE   Bilirubin Urine NEGATIVE NEGATIVE   Ketones, ur 15 (A) NEGATIVE mg/dL   Protein, ur NEGATIVE NEGATIVE mg/dL   Urobilinogen, UA 1.0 0.0 - 1.0 mg/dL   Nitrite NEGATIVE NEGATIVE   Leukocytes, UA NEGATIVE NEGATIVE  Urine microscopic-add on     Status: Abnormal   Collection Time: 11/20/14  6:35  PM  Result Value Ref  Range   Squamous Epithelial / LPF FEW (A) RARE   WBC, UA 0-2 <3 WBC/hpf   RBC / HPF 7-10 <3 RBC/hpf   Bacteria, UA FEW (A) RARE  MRSA PCR Screening     Status: None   Collection Time: 11/20/14  9:49 PM  Result Value Ref Range   MRSA by PCR NEGATIVE NEGATIVE    Comment:        The GeneXpert MRSA Assay (FDA approved for NASAL specimens only), is one component of a comprehensive MRSA colonization surveillance program. It is not intended to diagnose MRSA infection nor to guide or monitor treatment for MRSA infections.   Blood gas, arterial     Status: Abnormal   Collection Time: 11/20/14 10:35 PM  Result Value Ref Range   FIO2 0.40 %   Delivery systems VENTILATOR    Mode PRESSURE REGULATED VOLUME CONTROL    VT 500 mL   Rate 14 resp/min   Peep/cpap 5.0 cm H20   pH, Arterial 7.341 (L) 7.350 - 7.450   pCO2 arterial 42.7 35.0 - 45.0 mmHg   pO2, Arterial 182 (H) 80.0 - 100.0 mmHg   Bicarbonate 22.7 20.0 - 24.0 mEq/L   TCO2 24.0 0 - 100 mmol/L   Acid-base deficit 2.3 (H) 0.0 - 2.0 mmol/L   O2 Saturation 99.3 %   Patient temperature 97.7    Collection site A-LINE    Drawn by [id number]    Sample type ARTERIAL DRAW   Triglycerides     Status: None   Collection Time: 11/20/14 10:52 PM  Result Value Ref Range   Triglycerides 62 <150 mg/dL  Glucose, capillary     Status: Abnormal   Collection Time: 11/20/14 11:10 PM  Result Value Ref Range   Glucose-Capillary 105 (H) 65 - 99 mg/dL  Blood gas, arterial     Status: Abnormal   Collection Time: 11/21/14  3:22 AM  Result Value Ref Range   FIO2 0.40 %   Delivery systems VENTILATOR    Mode PRESSURE REGULATED VOLUME CONTROL    VT 400 mL   Rate 20 resp/min   Peep/cpap 5.0 cm H20   pH, Arterial 7.476 (H) 7.350 - 7.450   pCO2 arterial 24.9 (L) 35.0 - 45.0 mmHg   pO2, Arterial 182 (H) 80.0 - 100.0 mmHg   Bicarbonate 18.1 (L) 20.0 - 24.0 mEq/L   TCO2 18.9 0 - 100 mmol/L   Acid-base deficit 4.8 (H) 0.0 - 2.0 mmol/L   O2 Saturation 99.6  %   Patient temperature 98.6    Collection site A-LINE    Drawn by [id number]    Sample type ARTERIAL DRAW   Glucose, capillary     Status: Abnormal   Collection Time: 11/21/14  3:26 AM  Result Value Ref Range   Glucose-Capillary 62 (L) 65 - 99 mg/dL  Glucose, capillary     Status: Abnormal   Collection Time: 11/21/14  4:02 AM  Result Value Ref Range   Glucose-Capillary 187 (H) 65 - 99 mg/dL  CBC WITH DIFFERENTIAL     Status: Abnormal   Collection Time: 11/21/14  6:15 AM  Result Value Ref Range   WBC 7.8 4.0 - 10.5 K/uL   RBC 4.13 3.87 - 5.11 MIL/uL   Hemoglobin 11.8 (L) 12.0 - 15.0 g/dL   HCT 34.9 (L) 36.0 - 46.0 %   MCV 84.5 78.0 - 100.0 fL   MCH 28.6 26.0 - 34.0 pg   MCHC 33.8  30.0 - 36.0 g/dL   RDW 12.4 11.5 - 15.5 %   Platelets 160 150 - 400 K/uL   Neutrophils Relative % 75 43 - 77 %   Neutro Abs 5.9 1.7 - 7.7 K/uL   Lymphocytes Relative 17 12 - 46 %   Lymphs Abs 1.3 0.7 - 4.0 K/uL   Monocytes Relative 7 3 - 12 %   Monocytes Absolute 0.5 0.1 - 1.0 K/uL   Eosinophils Relative 1 0 - 5 %   Eosinophils Absolute 0.1 0.0 - 0.7 K/uL   Basophils Relative 0 0 - 1 %   Basophils Absolute 0.0 0.0 - 0.1 K/uL  Basic metabolic panel     Status: Abnormal   Collection Time: 11/21/14  6:15 AM  Result Value Ref Range   Sodium 137 135 - 145 mmol/L   Potassium 3.4 (L) 3.5 - 5.1 mmol/L   Chloride 108 101 - 111 mmol/L   CO2 24 22 - 32 mmol/L   Glucose, Bld 144 (H) 65 - 99 mg/dL   BUN 9 6 - 20 mg/dL   Creatinine, Ser 0.62 0.44 - 1.00 mg/dL   Calcium 8.1 (L) 8.9 - 10.3 mg/dL   GFR calc non Af Amer >60 >60 mL/min   GFR calc Af Amer >60 >60 mL/min    Comment: (NOTE) The eGFR has been calculated using the CKD EPI equation. This calculation has not been validated in all clinical situations. eGFR's persistently <60 mL/min signify possible Chronic Kidney Disease.    Anion gap 5 5 - 15  Lipid panel     Status: Abnormal   Collection Time: 11/21/14  6:15 AM  Result Value Ref Range    Cholesterol 127 0 - 200 mg/dL   Triglycerides 65 <150 mg/dL   HDL 38 (L) >40 mg/dL   Total CHOL/HDL Ratio 3.3 RATIO   VLDL 13 0 - 40 mg/dL   LDL Cholesterol 76 0 - 99 mg/dL    Comment:        Total Cholesterol/HDL:CHD Risk Coronary Heart Disease Risk Table                     Men   Women  1/2 Average Risk   3.4   3.3  Average Risk       5.0   4.4  2 X Average Risk   9.6   7.1  3 X Average Risk  23.4   11.0        Use the calculated Patient Ratio above and the CHD Risk Table to determine the patient's CHD Risk.        ATP III CLASSIFICATION (LDL):  <100     mg/dL   Optimal  100-129  mg/dL   Near or Above                    Optimal  130-159  mg/dL   Borderline  160-189  mg/dL   High  >190     mg/dL   Very High   Glucose, capillary     Status: Abnormal   Collection Time: 11/21/14  8:26 AM  Result Value Ref Range   Glucose-Capillary 129 (H) 65 - 99 mg/dL  Glucose, capillary     Status: Abnormal   Collection Time: 11/21/14 12:11 PM  Result Value Ref Range   Glucose-Capillary 111 (H) 65 - 99 mg/dL   Comment 1 Notify RN    Comment 2 Document in Chart   Glucose, capillary  Status: Abnormal   Collection Time: 11/21/14  3:47 PM  Result Value Ref Range   Glucose-Capillary 105 (H) 65 - 99 mg/dL    Ct Head Wo Contrast  11/20/2014   CLINICAL DATA:  Stroke. Sudden onset left-sided weakness, left facial droop, and slurred speech. Status post cerebral angiogram and revascularization of occluded right MCA.  EXAM: CT HEAD WITHOUT CONTRAST  TECHNIQUE: Contiguous axial images were obtained from the base of the skull through the vertex without intravenous contrast.  COMPARISON:  Head CT earlier today  FINDINGS: Residual vascular contrast is noted from recent cerebral angiogram. Ventricles and sulci are normal. There is new, subtle decreased attenuation involving the right caudate and anterior right lentiform nuclei consistent with early cytotoxic edema. No definite cortical edema is  identified. There is no evidence of acute intracranial hemorrhage, mass, midline shift, or extra-axial fluid collection.  Orbits are unremarkable. Mastoid air cells are clear. Trace left maxillary sinus mucosal thickening is noted.  IMPRESSION: 1. Subtle edema in the right basal ganglia consistent with acute infarction. 2. No intracranial hemorrhage.   Electronically Signed   By: Logan Bores   On: 11/20/2014 21:34   Ct Head Wo Contrast  11/20/2014   CLINICAL DATA:  Code stroke, sudden onset left-sided weakness, left facial droop, slurred speech  EXAM: CT HEAD WITHOUT CONTRAST  TECHNIQUE: Contiguous axial images were obtained from the base of the skull through the vertex without intravenous contrast.  COMPARISON:  None.  FINDINGS: No evidence of parenchymal hemorrhage or extra-axial fluid collection. No mass lesion, mass effect, or midline shift.  No CT evidence of acute infarction.  Mild subcortical white matter and periventricular small vessel ischemic changes.  Cerebral volume is within normal limits.  No ventriculomegaly.  The visualized paranasal sinuses are essentially clear. The mastoid air cells are unopacified.  No evidence of calvarial fracture.  IMPRESSION: No evidence of acute intracranial abnormality.  Mild small vessel ischemic changes.  These results were called by telephone at the time of interpretation on 11/20/2014 at 5:53 pm to Dr. Armida Sans, who verbally acknowledged these results.   Electronically Signed   By: Julian Hy M.D.   On: 11/20/2014 17:53   Mr Brain Wo Contrast  11/21/2014   EXAM: MRI HEAD WITHOUT CONTRAST  TECHNIQUE: Multiplanar, multiecho pulse sequences of the brain and surrounding structures were obtained without intravenous contrast.  COMPARISON:  CT head initial 11/20/2014. CT head postprocedure 11/20/2014. Catheter angiogram with thrombolysis 11/20/2014.  FINDINGS: Moderate-sized area of restricted diffusion representing acute infarction, affects the RIGHT lentiform  nucleus and RIGHT caudate, also involving the RIGHT external capsule, largely sparing the internal capsule and thalamus as well as sparing the insular cortex. Cytotoxic edema within the affected areas of RIGHT basal ganglia appears mildly increased from prior CT. No significant RIGHT-to-LEFT shift.  Normal cerebral volume.  No significant white matter disease.  Abnormal FLAIR signal is present in the prepontine and subarachnoid cisterns, likely acute subarachnoid blood. Focal area of susceptibility in the interpeduncular cistern on gradient sequence, also suggesting acute subarachnoid blood. These areas are not seen on prior CTs. This finding is most likely related to post tPA therapy, but there is no reperfusion parenchymal hemorrhage within the infarct.  Tiny foci of chronic hemorrhage are seen throughout the cerebral hemispheres, likely sequelae of hypertensive cerebrovascular disease. A tiny focus of susceptibility in the extreme capsule (image 15 series 9) is not suspicious for reperfusion hemorrhage.  Flow voids are maintained in the carotid, basilar, and vertebral arteries.  Similarly, flow void within the RIGHT M1 MCA appears robust. Fetal origin RIGHT PCA.  Normal pituitary and cerebellar tonsils. Mega cisterna magna. Chronic and acute paranasal sinus disease, with an air-fluid level in the smaller RIGHT division of the sphenoid. Negative orbits. No osseous findings. No mastoid fluid.  IMPRESSION: Acute infarction affects much of the RIGHT basal ganglia, including the caudate and lentiform nucleus. No cortical infarction is evident.  No hemorrhagic transformation within the infarct, but acute subarachnoid blood is suspected in the prepontine and suprasellar cistern. Consider MRA intracranial for further evaluation.  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  Chronic and acute RIGHT sphenoid sinusitis.   RIGHT ICA and RIGHT MCA remain patent based on flow void appearance.  Findings discussed with ordering provider.   Electronically Signed   By: Staci Righter M.D.   On: 11/21/2014 18:32   Dg Chest Port 1 View  11/20/2014   CLINICAL DATA:  Respiratory distress, post IR procedure  EXAM: PORTABLE CHEST - 1 VIEW  COMPARISON:  11/16/2014  FINDINGS: Lungs are essentially clear. No focal consolidation. No pleural effusion or pneumothorax.  Endotracheal tube at the thoracic inlet, 4 cm above the carina.  The heart is top-normal in size. Enlargement of the main pulmonary artery.  IMPRESSION: No evidence of acute cardiopulmonary disease.  Endotracheal tube terminates at the thoracic inlet, 4 cm above the carina.   Electronically Signed   By: Julian Hy M.D.   On: 11/20/2014 23:13    Review of Systems  Constitutional: Negative for fever and chills.  Eyes: Negative for double vision.  Respiratory: Negative for cough and hemoptysis.   Cardiovascular: Negative for chest pain, palpitations, orthopnea, leg swelling and PND.  Gastrointestinal: Positive for nausea. Negative for abdominal pain and diarrhea.  Genitourinary: Negative for dysuria.  Neurological: Negative for dizziness, seizures and headaches.   Blood pressure 114/77, pulse 115, temperature 98.7 F (37.1 C), temperature source Oral, resp. rate 14, height 5' 9"  (1.753 m), weight 56 kg (123 lb 7.3 oz), SpO2 95 %. Physical Exam  Constitutional: She is oriented to person, place, and time.  HENT:  Head: Normocephalic and atraumatic.  Eyes: Conjunctivae are normal. Left eye exhibits no discharge.  Neck: Normal range of motion. Neck supple. No JVD present. No tracheal deviation present. No thyromegaly present.  Cardiovascular:  Murmur (Soft systolic murmur noted) heard. Irregularly irregular S1 and S2 soft no S3 gallop  Respiratory: Effort normal and breath sounds normal. No respiratory distress. She has no wheezes. She has no rales.  GI: Soft.  Bowel sounds are normal. She exhibits no distension. There is no tenderness.  Musculoskeletal: She exhibits no edema or tenderness.  Neurological: She is alert and oriented to person, place, and time.    Assessment/Plan: Status post cardioembolic right basal ganglia infarct Acute subarachnoid hemorrhage in the prepontine and suprasellar cistern and chronic microbleeds. Recurrent A. fib with RVR status post recent cardioversion on 11/16/2014 History of mitral valve prolapse status post bioprosthetic mitral valve replacement in 2008 History of bronchial asthma Osteoporosis Plan Will switch beta blockers to IV Cardizem as per orders for rate control Consider chronic anticoagulation once cleared from neuro point a few. Will not attempt any cardioversion in view of embolic CVA acutely. If markedly symptomatic with A. fib and then only will consider TEE cardioversion as last resort. May need left atrial appendage closure device if patient deemed not appropriate for chronic anticoagulation. Will arrange for TEE  to better evaluate left atrial appendage and further risks stratify. Dr. Einar Gip will follow patient from tomorrow morning  Charolette Forward 11/21/2014, 7:40 PM

## 2014-11-21 NOTE — Progress Notes (Signed)
eLink Physician-Brief Progress Note Patient Name: Martha Clark DOB: [birth year] MRN: [id number]   Date of Service  11/21/2014  HPI/Events of Note  Patient in afib with HR 110-120.  BP stable.  eICU Interventions  Cardiology consult for ongoing afib management in patient with prior valve re[placement     Intervention Category Intermediate Interventions: Arrhythmia - evaluation and management  Henry Russel, P 11/21/2014, 6:51 PM

## 2014-11-21 NOTE — Progress Notes (Signed)
Notified CCM Md about pt continuing to have a-fib with a rate in the low 100's despite dose of lopressor. CCM Md to come see patient and cards consult to be initiated.

## 2014-11-21 NOTE — Progress Notes (Signed)
eLink Physician-Brief Progress Note Patient Name: Martha Clark DOB: 06-13-1955 MRN: [id number]   Date of Service  11/21/2014  HPI/Events of Note  Called by nurse for Afib with RVR HR 100-130.  Patient has a history of MVR and Afib.  Admitted with CVA.  Was on tambocor and metoprolol as outpatient which has not been restarted.  Patient extubated today but not yet able to swallow.  No oral access presently.    eICU Interventions  Start IV lopressor scheduled If ability to swallow unlikely to return in short term will need GI access for medications and nutrition     Intervention Category Intermediate Interventions: Arrhythmia - evaluation and management  Henry Russel, P 11/21/2014, 4:34 PM

## 2014-11-21 NOTE — Consult Note (Signed)
PULMONARY / CRITICAL CARE MEDICINE   Name: Martha Clark MRN: [id number] DOB: 1955/06/07    ADMISSION DATE:  11/20/2014 CONSULTATION DATE:  11/20/2014  REFERRING MD :  Dr. Pearlean Brownie  CHIEF COMPLAINT:  Acute CVA  INITIAL PRESENTATION: 6 F s/p remote mitral valve replacement with new Dx Afib s/p cardioversion in ED on 6/21 who presented to [facility] today with massive CVA now s/p peripheral and central tPA. Patient transferred from VIR on vent and PCCM asked to consult on vent management.    STUDIES:  N/A  SIGNIFICANT EVENTS: Thrombectomy and Peripheral tPA 6/25  HISTORY OF PRESENT ILLNESS:  Martha Clark is a 57 F  s/p remote mitral valve replacement (presumably tissue) with new Dx Afib s/p cardioversion in ED on 6/21 who presented to [facility] today with acute hemiparesis and facial droop. The following was obtained from chart review as the patient is currently intubated and sedated. On arrival she received IV tPA and was transferred to VIR for thrombectomy.   SUBJECTIVE: No events overnight, awake and following commands.  VITAL SIGNS: Temp:  [97.4 F (36.3 C)-97.7 F (36.5 C)] 97.4 F (36.3 C) (06/26 0356) Pulse Rate:  [55-77] 77 (06/26 1000) Resp:  [12-25] 16 (06/26 1000) BP: (91-152)/(53-85) 109/62 mmHg (06/26 1000) SpO2:  [98 %-100 %] 100 % (06/26 1000) Arterial Line BP: (117-139)/(50-65) 138/59 mmHg (06/26 1000) FiO2 (%):  [40 %] 40 % (06/26 1000) Weight:  [56 kg (123 lb 7.3 oz)] 56 kg (123 lb 7.3 oz) (06/25 1700) HEMODYNAMICS:   VENTILATOR SETTINGS: Vent Mode:  [-] CPAP;PSV FiO2 (%):  [40 %] 40 % Set Rate:  [14 bmp-20 bmp] 20 bmp Vt Set:  [400 mL-500 mL] 400 mL PEEP:  [5 cmH20] 5 cmH20 Pressure Support:  [5 cmH20] 5 cmH20 Plateau Pressure:  [15 cmH20-21 cmH20] 15 cmH20 INTAKE / OUTPUT:  Intake/Output Summary (Last 24 hours) at 11/21/14 1037 Last data filed at 11/21/14 1000  Gross per 24 hour  Intake 2323.02 ml  Output   1165 ml  Net 1158.02 ml    PHYSICAL  EXAMINATION: General:  Sedated but following commands. Neuro:  Moving all ext to commands. HEENT:  Sclera anicteric, conjunctiva pink, MMM, ETT present Cardiovascular:  RRR, NS1/S2, (-) MRG Lungs:  CTAB Abdomen:  S/NT/ND/(+)BS Musculoskeletal:  Sheath in place, (-) C/C/E Skin:  Intact  LABS:  CBC  Recent Labs Lab 11/16/14 0153 11/20/14 1738 11/20/14 1748 11/21/14 0615  WBC 9.6 5.3  --  7.8  HGB 14.0 13.0 13.6 11.8*  HCT 41.6 39.5 40.0 34.9*  PLT 169 173  --  160   Coag's  Recent Labs Lab 11/20/14 1738  APTT 27  INR 1.09   BMET  Recent Labs Lab 11/16/14 0153 11/20/14 1738 11/20/14 1748 11/21/14 0615  NA 140 142 142 137  K 3.9 3.8 3.8 3.4*  CL 102 107 106 108  CO2 30 23  --  24  BUN 16 22* 24* 9  CREATININE 0.87 0.76 0.80 0.62  GLUCOSE 117* 118* 117* 144*   Electrolytes  Recent Labs Lab 11/16/14 0153 11/20/14 1738 11/21/14 0615  CALCIUM 8.8* 8.9 8.1*   Sepsis Markers No results for input(s): LATICACIDVEN, PROCALCITON, O2SATVEN in the last 168 hours. ABG  Recent Labs Lab 11/20/14 2235 11/21/14 0322  PHART 7.341* 7.476*  PCO2ART 42.7 24.9*  PO2ART 182* 182*   Liver Enzymes  Recent Labs Lab 11/20/14 1738  AST 34  ALT 43  ALKPHOS 72  BILITOT 0.5  ALBUMIN 3.4*  Cardiac Enzymes No results for input(s): TROPONINI, PROBNP in the last 168 hours. Glucose  Recent Labs Lab 11/20/14 1753 11/20/14 2310 11/21/14 0326 11/21/14 0402 11/21/14 0826  GLUCAP 119* 105* 62* 187* 129*    Imaging Ct Head Wo Contrast  11/20/2014   CLINICAL DATA:  Stroke. Sudden onset left-sided weakness, left facial droop, and slurred speech. Status post cerebral angiogram and revascularization of occluded right MCA.  EXAM: CT HEAD WITHOUT CONTRAST  TECHNIQUE: Contiguous axial images were obtained from the base of the skull through the vertex without intravenous contrast.  COMPARISON:  Head CT earlier today  FINDINGS: Residual vascular contrast is noted from  recent cerebral angiogram. Ventricles and sulci are normal. There is new, subtle decreased attenuation involving the right caudate and anterior right lentiform nuclei consistent with early cytotoxic edema. No definite cortical edema is identified. There is no evidence of acute intracranial hemorrhage, mass, midline shift, or extra-axial fluid collection.  Orbits are unremarkable. Mastoid air cells are clear. Trace left maxillary sinus mucosal thickening is noted.  IMPRESSION: 1. Subtle edema in the right basal ganglia consistent with acute infarction. 2. No intracranial hemorrhage.   Electronically Signed   By: Sebastian Ache   On: 11/20/2014 21:34   Ct Head Wo Contrast  11/20/2014   CLINICAL DATA:  Code stroke, sudden onset left-sided weakness, left facial droop, slurred speech  EXAM: CT HEAD WITHOUT CONTRAST  TECHNIQUE: Contiguous axial images were obtained from the base of the skull through the vertex without intravenous contrast.  COMPARISON:  None.  FINDINGS: No evidence of parenchymal hemorrhage or extra-axial fluid collection. No mass lesion, mass effect, or midline shift.  No CT evidence of acute infarction.  Mild subcortical white matter and periventricular small vessel ischemic changes.  Cerebral volume is within normal limits.  No ventriculomegaly.  The visualized paranasal sinuses are essentially clear. The mastoid air cells are unopacified.  No evidence of calvarial fracture.  IMPRESSION: No evidence of acute intracranial abnormality.  Mild small vessel ischemic changes.  These results were called by telephone at the time of interpretation on 11/20/2014 at 5:53 pm to Dr. Leroy Kennedy, who verbally acknowledged these results.   Electronically Signed   By: Charline Bills M.D.   On: 11/20/2014 17:53   Dg Chest Port 1 View  11/20/2014   CLINICAL DATA:  Respiratory distress, post IR procedure  EXAM: PORTABLE CHEST - 1 VIEW  COMPARISON:  11/16/2014  FINDINGS: Lungs are essentially clear. No focal  consolidation. No pleural effusion or pneumothorax.  Endotracheal tube at the thoracic inlet, 4 cm above the carina.  The heart is top-normal in size. Enlargement of the main pulmonary artery.  IMPRESSION: No evidence of acute cardiopulmonary disease.  Endotracheal tube terminates at the thoracic inlet, 4 cm above the carina.   Electronically Signed   By: Charline Bills M.D.   On: 11/20/2014 23:13   ASSESSMENT / PLAN:  PULMONARY OETT Unclear, X-ray Pending A:  Ventilator Dependence Asthma Hx: Details not currently available but according to med list not on controller meds so likely mild.  P:   SBT to extubate today after sheaths are out. VAP Prevention. Titrate O2 for sats.  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  GASTROINTESTINAL A:  S/P CVA P: Swallow evaluation ordered.  HEMATOLOGIC A:  No acute issue  INFECTIOUS A:  No acute issue  ENDOCRINE A:  Elevated Blood Glucose   P:  SSI A1c ordered by primary team  NEUROLOGIC A:   CVA P:   RASS goal: 0 D/C propofol and fentanyl when ready to extubate.   FAMILY  - Updates: No family bedside.  - Inter-disciplinary family meet or Palliative Care meeting due by:  7/2    TODAY'S SUMMARY: After removal of sheaths will extubate.  The patient is critically ill with multiple organ systems failure and requires high complexity decision making for assessment and support, frequent evaluation and titration of therapies, application of advanced monitoring technologies and extensive interpretation of multiple databases.   Critical Care Time devoted to patient care services described in this note is  35  Minutes. This time reflects time of care of this signee Dr Koren Bound. This critical care time does not reflect procedure time, or teaching time or supervisory time of PA/NP/Med student/Med Resident etc but could involve care discussion time.  Alyson Reedy, M.D. [facility] Pulmonary/Critical Care Medicine. Pager: [phone]. After hours pager: [phone].  11/21/2014, 10:37 AM

## 2014-11-21 NOTE — Progress Notes (Signed)
PT Cancellation Note  Patient Details Name: Martha Clark MRN: [id number] DOB: 07-28-1955   Cancelled Treatment:    Reason Eval/Treat Not Completed: Patient not medically ready.  Patient received tPA at 18:10 last pm (11/20/14) and centrally during IR procedure.  Patient on bedrest x 24 hours following tPA.  Will return tomorrow for PT evaluation as appropriate for patient.  Thank you.   Vena Austria 11/21/2014, 8:02 AM Durenda Hurt. Renaldo Fiddler, [facility] Acute Rehab Services Pager [phone]

## 2014-11-21 NOTE — Progress Notes (Signed)
Paged Neuro Md about 24 hour s/p Tpa MRI completion.

## 2014-11-21 NOTE — Progress Notes (Signed)
STROKE TEAM PROGRESS NOTE   HISTORY Martha Clark is a 59 y.o. female with a past medical history significant for MVP s/p bovine mitral valve replacement, newly diagnosed atrial fibrillation s/p cardioversion in he ER on 6/21 , osteoporosis, and asthma, brought in via EMS due to acute onset of left hemiparesis, dysarthria, and left facial weakness. Patient was home with family when she fell and called out for her family in the other room and noted patient to have left side paralysis with facial droop and slurred speech. EMS was immediately summoned and patient brought to the ED. Awake and alert at initial evaluation, with NIHSS 15 and CT scan that was personally reviewed and showed no acute abnormality. She denies HA, vertigo, double vision, difficulty swallowing, or visual disturbances. No recent bleeding or surgeries. Patient is not taking anticoagulants.  Date last known well: 11/21/13 Time last known well: 1655  tPA Given: yes IV t-PA  50 mg on 11/20/2014 at 1800. NIHSS: 15   Dr Corliss Skains  CEREBRAL Rosalin Hawking [[id number] (Custom)]      Expand All Collapse All   S/P RT common carotid arteriogram,folloed by complete revascularization of occluded RTY MCA with 5mg  of superselective TPA and x1 pass with the Soltaire FR 4mm x 40 mm retrieval device With TICI 3 revascularization       SUBJECTIVE (INTERVAL HISTORY) There is no family at the bedside. Patient is intubated. She is alert and following commands and symptoms have significantly improved. She gives me the thumbs up when I tell her that her improvement is remarkable.   OBJECTIVE Temp:  [97.4 F (36.3 C)-97.7 F (36.5 C)] 97.4 F (36.3 C) (06/26 0356) Pulse Rate:  [55-74] 72 (06/26 0700) Cardiac Rhythm:  [-] Bundle branch block (06/25 1812) Resp:  [12-25] 20 (06/26 0700) BP: (91-152)/(53-85) 102/58 mmHg (06/26 0700) SpO2:  [98 %-100 %] 100 % (06/26 0700) Arterial Line BP: (117-139)/(50-65) 128/55 mmHg (06/26 0700) FiO2 (%):   [40 %] 40 % (06/26 0334) Weight:  [56 kg (123 lb 7.3 oz)] 56 kg (123 lb 7.3 oz) (06/25 1700)   Recent Labs Lab 11/20/14 1753 11/20/14 2310 11/21/14 0326 11/21/14 0402  GLUCAP 119* 105* 62* 187*    Recent Labs Lab 11/16/14 0153 11/20/14 1738 11/20/14 1748 11/21/14 0615  NA 140 142 142 137  K 3.9 3.8 3.8 3.4*  CL 102 107 106 108  CO2 30 23  --  24  GLUCOSE 117* 118* 117* 144*  BUN 16 22* 24* 9  CREATININE 0.87 0.76 0.80 0.62  CALCIUM 8.8* 8.9  --  8.1*    Recent Labs Lab 11/20/14 1738  AST 34  ALT 43  ALKPHOS 72  BILITOT 0.5  PROT 5.9*  ALBUMIN 3.4*    Recent Labs Lab 11/16/14 0153 11/20/14 1738 11/20/14 1748 11/21/14 0615  WBC 9.6 5.3  --  7.8  NEUTROABS  --  2.7  --  5.9  HGB 14.0 13.0 13.6 11.8*  HCT 41.6 39.5 40.0 34.9*  MCV 87.0 86.1  --  84.5  PLT 169 173  --  160   No results for input(s): CKTOTAL, CKMB, CKMBINDEX, TROPONINI in the last 168 hours.  Recent Labs  11/20/14 1738  LABPROT 14.3  INR 1.09    Recent Labs  11/20/14 1835  COLORURINE YELLOW  LABSPEC 1.027  PHURINE 7.0  GLUCOSEU NEGATIVE  HGBUR SMALL*  BILIRUBINUR NEGATIVE  KETONESUR 15*  PROTEINUR NEGATIVE  UROBILINOGEN 1.0  NITRITE NEGATIVE  LEUKOCYTESUR NEGATIVE  Component Value Date/Time   CHOL 127 11/21/2014 0615   TRIG 65 11/21/2014 0615   HDL 38* 11/21/2014 0615   CHOLHDL 3.3 11/21/2014 0615   VLDL 13 11/21/2014 0615   LDLCALC 76 11/21/2014 0615   No results found for: HGBA1C No results found for: LABOPIA, COCAINSCRNUR, LABBENZ, AMPHETMU, THCU, LABBARB  No results for input(s): ETH in the last 168 hours.   Imaging   Ct Head Wo Contrast 11/20/2014    1. Subtle edema in the right basal ganglia consistent with acute infarction.  2. No intracranial hemorrhage.      Ct Head Wo Contrast 11/20/2014    No evidence of acute intracranial abnormality.  Mild small vessel ischemic changes.      Dg Chest Port 1 View 11/20/2014    No evidence of acute  cardiopulmonary disease.  Endotracheal tube terminates at the thoracic inlet, 4 cm above the carina.         Physical exam: Exam: Gen: NAD, intubated Eyes: anicteric sclerae, moist conjunctivae                    CV: no MRG, no carotid bruits, no peripheral edema Mental Status: Alert, follows commands  Neuro: Detailed Neurologic Exam  Speech:    Unable to test speech due to intubation  Cranial Nerves:    The pupils are equal, round, and reactive to light. Fundi flat.  EOMI. No gaze preference. Visual fields full. Face appears symmetric but difficult to assess due to tubing, Tongue appears midline but again difficult to assess due to tubing. Hearing intact to voice. Shoulder shrug intact.   Motor Observation:    no involuntary movements noted. Tone appears normal.     Strength:   Mild left arm drift     Sensation:  Intact to LT and pin prick x 4  Plantars downgoing.   DTRs: normal and symmetric  Coordination: mild dysmetria left arm   ASSESSMENT/PLAN Martha Clark is a 59 y.o. female with history of  mitral valve prolapse, status post bovine mitral valve replacement, newly diagnosed atrial fibrillation, status post cardioversion in the emergency department on 11/16/2014, osteoporosis, and asthma presenting with left hemiparesis, left facial weakness, and dysarthria. She did receive IV t-PA  50 mg on 11/20/2014 at 1800. She appears to be making a remarkable recovery from her admission and NIHSS 15 and now with just some mild left arm drift on exam today however unable to assess speech due to intubation.  Stroke:  Non-dominant infarct - embolic secondary to atrial fibrillation.  Resultant  Mild left arm drift with mild left arm dysmetria.   MRI  pending  MRA  See angiogram results.  Carotid Doppler  See angiogram results.  2D Echo  pending  LDL 76  HgbA1c pending  SCDs for VTE prophylaxis  Diet NPO time specified  aspirin 81 mg orally every day prior to  admission, now on no antithrombotic at this time secondary to TPA.  Patient counseled to be compliant with her antithrombotic medications  Ongoing aggressive stroke risk factor management  Therapy recommendations:  Pending  Disposition: Pending  Hypertension  Home meds:  Metoprolol  Blood pressures running low - approximately 100 mmHg systolic   Hyperlipidemia  Home meds: No lipid lowering medications prior to admission  LDL 76, goal < 70  Consider low-dose Lipitor 10 mg daily  Continue statin at discharge     Other Stroke Risk Factors  Family hx stroke (Father)  Atrial fibrillation  Other Active Problems  Mild anemia  Mild hypokalemia  Mildly elevated glucose    Other Pertinent History Dr Corliss Skains  CEREBRAL Rosalin Hawking [[id number] (Custom)]      Expand All Collapse All   S/P RT common carotid arteriogram,folloed by complete revascularization of occluded RTY MCA with  of superselective TPA and x1 pass with the Soltaire FR 4mm x 40 mm retrieval device With TICI 3 revascularization       PLAN  Start aspirin by the end of the second hospital day if imaging reveals no bleeding.  Consider long-term anticoagulation for atrial fibrillation.  Hospital day # 1  Personally examined patient and images, and have participated in and made any corrections needed to history, physical, neuro exam,assessment and plan as stated above. This patient is at significant risk of neurological worsening, death and care requires constant monitoring of vital signs, hemodynamics,respiratory and cardiac monitoring,review of multiple databases, neurological assessment, discussion with family, other specialists and medical decision making of high complexity.I  I spent 30 minutes of neurocritical care time in the care of this patient.  Naomie Dean, MD [facility] Pager: [phone] 08/20/2014 5:09 PM        To contact Stroke Continuity provider,  please refer to [email or web address]. After hours, contact General Neurology

## 2014-11-21 NOTE — Procedures (Signed)
Extubation Procedure Note  Patient Details:   Name: Martha Clark DOB: [birth year] MRN: [id number]   Airway Documentation:     Evaluation  O2 sats: stable throughout Complications: No apparent complications Patient did tolerate procedure well. Bilateral Breath Sounds: Clear Suctioning: Oral Yes,  Pt extubated to a 4lpm [place], Pt is alert & oriented.   Melanee Spry 11/21/2014, 11:57 AM

## 2014-11-22 ENCOUNTER — Encounter (HOSPITAL_COMMUNITY): Payer: Self-pay | Admitting: Interventional Radiology

## 2014-11-22 ENCOUNTER — Inpatient Hospital Stay (HOSPITAL_COMMUNITY): Payer: BLUE CROSS/BLUE SHIELD

## 2014-11-22 DIAGNOSIS — E876 Hypokalemia: Secondary | ICD-10-CM | POA: Diagnosis present

## 2014-11-22 DIAGNOSIS — Q251 Coarctation of aorta: Secondary | ICD-10-CM | POA: Diagnosis present

## 2014-11-22 DIAGNOSIS — J9690 Respiratory failure, unspecified, unspecified whether with hypoxia or hypercapnia: Secondary | ICD-10-CM | POA: Diagnosis not present

## 2014-11-22 LAB — CBC
HCT: 39.2 % (ref 36.0–46.0)
Hemoglobin: 12.9 g/dL (ref 12.0–15.0)
MCH: 28.2 pg (ref 26.0–34.0)
MCHC: 32.9 g/dL (ref 30.0–36.0)
MCV: 85.8 fL (ref 78.0–100.0)
Platelets: 173 10*3/uL (ref 150–400)
RBC: 4.57 MIL/uL (ref 3.87–5.11)
RDW: 12.5 % (ref 11.5–15.5)
WBC: 7.5 10*3/uL (ref 4.0–10.5)

## 2014-11-22 LAB — BASIC METABOLIC PANEL
Anion gap: 6 (ref 5–15)
BUN: <5 mg/dL — ABNORMAL LOW (ref 6–20)
CO2: 25 mmol/L (ref 22–32)
Calcium: 8.3 mg/dL — ABNORMAL LOW (ref 8.9–10.3)
Chloride: 110 mmol/L (ref 101–111)
Creatinine, Ser: 0.59 mg/dL (ref 0.44–1.00)
GFR calc Af Amer: >60 mL/min (ref >60)
GFR calc non Af Amer: >60 mL/min (ref >60)
Glucose, Bld: 101 mg/dL — ABNORMAL HIGH (ref 65–99)
Potassium: 3.6 mmol/L (ref 3.5–5.1)
Sodium: 141 mmol/L (ref 135–145)

## 2014-11-22 LAB — PHOSPHORUS: Phosphorus: 3.6 mg/dL (ref 2.5–4.6)

## 2014-11-22 LAB — GLUCOSE, CAPILLARY
Glucose-Capillary: 84 mg/dL (ref 65–99)
Glucose-Capillary: 107 mg/dL — ABNORMAL HIGH (ref 65–99)
Glucose-Capillary: 98 mg/dL (ref 65–99)

## 2014-11-22 LAB — MAGNESIUM: Magnesium: 1.6 mg/dL — ABNORMAL LOW (ref 1.7–2.4)

## 2014-11-22 LAB — HEMOGLOBIN A1C
Hgb A1c MFr Bld: 5.3 % (ref 4.8–5.6)
Mean Plasma Glucose: 105 mg/dL

## 2014-11-22 MED ORDER — APIXABAN 5 MG PO TABS
5.0000 mg | ORAL_TABLET | Freq: Two times a day (BID) | ORAL | Status: DC
  Administered 2014-11-22 – 2014-11-24 (×5): 5 mg via ORAL
  Filled 2014-11-22 (×6): qty 1

## 2014-11-22 MED ORDER — BUPROPION HCL ER (XL) 150 MG PO TB24
150.0000 mg | ORAL_TABLET | Freq: Every day | ORAL | Status: DC
  Administered 2014-11-22 – 2014-11-24 (×3): 150 mg via ORAL
  Filled 2014-11-22 (×3): qty 1

## 2014-11-22 MED ORDER — POTASSIUM CHLORIDE CRYS ER 20 MEQ PO TBCR
40.0000 meq | EXTENDED_RELEASE_TABLET | Freq: Three times a day (TID) | ORAL | Status: AC
  Administered 2014-11-22 (×2): 40 meq via ORAL
  Filled 2014-11-22 (×2): qty 2

## 2014-11-22 MED ORDER — FLECAINIDE ACETATE 50 MG PO TABS
25.0000 mg | ORAL_TABLET | Freq: Two times a day (BID) | ORAL | Status: DC
Start: 2014-11-22 — End: 2014-11-22

## 2014-11-22 MED ORDER — ATORVASTATIN CALCIUM 10 MG PO TABS
10.0000 mg | ORAL_TABLET | Freq: Every day | ORAL | Status: DC
  Administered 2014-11-22 – 2014-11-23 (×2): 10 mg via ORAL
  Filled 2014-11-22 (×3): qty 1

## 2014-11-22 MED ORDER — PANTOPRAZOLE SODIUM 40 MG PO TBEC: 40.0000 mg | DELAYED_RELEASE_TABLET | Freq: Every day | ORAL | Status: DC

## 2014-11-22 MED ORDER — IOHEXOL 350 MG/ML SOLN
50.0000 mL | Freq: Once | INTRAVENOUS | Status: AC | PRN
  Administered 2014-11-22: 50 mL via INTRAVENOUS

## 2014-11-22 MED ORDER — DILTIAZEM HCL ER COATED BEADS 180 MG PO CP24
180.0000 mg | ORAL_CAPSULE | Freq: Every day | ORAL | Status: DC
  Administered 2014-11-22 – 2014-11-24 (×3): 180 mg via ORAL
  Filled 2014-11-22 (×3): qty 1

## 2014-11-22 NOTE — Evaluation (Signed)
Physical Therapy Evaluation Patient Details Name: Martha Clark MRN: [id number] DOB: May 02, 1956 Today's Date: 11/22/2014   History of Present Illness  pt presents with R Basal Ganglia Infarct post revascularization.  pt with hx of MVR and A-fib.    Clinical Impression  Pt moves slowly and with very guarded posture.  Pt needs encouragement to use L UE as she tends to hold it against her body.  Pt and husband are to move to a new home over the next couple of days and husband states he will be able to be home with pt at D/C.  Will continue to follow while on acute.      Follow Up Recommendations Home health PT;Supervision - Intermittent    Equipment Recommendations  None recommended by PT    Recommendations for Other Services       Precautions / Restrictions Precautions Precautions: Fall Restrictions Weight Bearing Restrictions: No      Mobility  Bed Mobility Overal bed mobility: Needs Assistance Bed Mobility: Supine to Sit     Supine to sit: Supervision     General bed mobility comments: pt needs increased time and encouragement to use L UE to A with mobility.    Transfers Overall transfer level: Needs assistance Equipment used: None Transfers: Sit to/from Stand Sit to Stand: Min guard         General transfer comment: pt very cautious and needs cues for safe technique.    Ambulation/Gait Ambulation/Gait assistance: Min guard Ambulation Distance (Feet): 150 Feet Assistive device: None Gait Pattern/deviations: Step-through pattern;Decreased stride length;Shuffle     General Gait Details: pt with small shuffle steps, but can increase step length with cueing, though L LE reverts to shuffling.    Stairs            Wheelchair Mobility    Modified Rankin (Stroke Patients Only) Modified Rankin (Stroke Patients Only) Pre-Morbid Rankin Score: No symptoms Modified Rankin: Moderately severe disability     Balance Overall balance assessment: Needs  assistance Sitting-balance support: No upper extremity supported;Feet supported Sitting balance-Leahy Scale: Good     Standing balance support: No upper extremity supported;During functional activity Standing balance-Leahy Scale: Fair                               Pertinent Vitals/Pain Pain Assessment: No/denies pain    Home Living Family/patient expects to be discharged to:: Private residence Living Arrangements: Spouse/significant other Available Help at Discharge: Family;Available 24 hours/day Type of Home: House Home Access: Stairs to enter Entrance Stairs-Rails: Right Entrance Stairs-Number of Steps: 5 Home Layout: Two level;Able to live on main level with bedroom/bathroom Home Equipment: None Additional Comments: pt and spouse to close on new home tomorrow, but unclear if this is info for current  home or new one.      Prior Function Level of Independence: Independent               Hand Dominance   Dominant Hand: Right    Extremity/Trunk Assessment   Upper Extremity Assessment: Defer to OT evaluation           Lower Extremity Assessment: LLE deficits/detail   LLE Deficits / Details: Strength overall functional, but decreased coordination and fatigues easily.    Cervical / Trunk Assessment: Normal  Communication   Communication: No difficulties  Cognition Arousal/Alertness: Awake/alert Behavior During Therapy: WFL for tasks assessed/performed Overall Cognitive Status: Within Functional Limits for tasks assessed  General Comments      Exercises        Assessment/Plan    PT Assessment Patient needs continued PT services  PT Diagnosis Difficulty walking   PT Problem List Decreased strength;Decreased balance;Decreased activity tolerance;Decreased mobility;Decreased coordination;Decreased knowledge of use of DME  PT Treatment Interventions DME instruction;Gait training;Stair training;Functional mobility  training;Therapeutic activities;Therapeutic exercise;Balance training;Neuromuscular re-education;Patient/family education   PT Goals (Current goals can be found in the Care Plan section) Acute Rehab PT Goals Patient Stated Goal: Back to normal. PT Goal Formulation: With patient Time For Goal Achievement: 11/29/14 Potential to Achieve Goals: Good    Frequency Min 4X/week   Barriers to discharge        Co-evaluation               End of Session Equipment Utilized During Treatment: Gait belt Activity Tolerance: Patient tolerated treatment well Patient left: in chair;with call bell/phone within reach;with family/visitor present Nurse Communication: Mobility status         Time: [id number] PT Time Calculation (min) (ACUTE ONLY): 26 min   Charges:   PT Evaluation $Initial PT Evaluation Tier I: 1 Procedure PT Treatments $Gait Training: 8-22 mins   PT G CodesSunny Schlein:        Caesar Mannella F, [place]PT [phone] 11/22/2014, 3:32 PM

## 2014-11-22 NOTE — Progress Notes (Signed)
PULMONARY / CRITICAL CARE MEDICINE   Name: Martha Clark MRN: [id number] DOB: 09/15/1955    ADMISSION DATE:  11/20/2014 CONSULTATION DATE:  11/20/2014  REFERRING MD :  Dr. Pearlean BrownieSethi  CHIEF COMPLAINT:  Acute CVA  INITIAL PRESENTATION: [age] F s/p remote mitral valve replacement with new Dx Afib s/p cardioversion in ED on 6/21 who [facility] with massive CVA now s/p peripheral and central tPA. Patient transferred from VIR on vent and PCCM asked to consult on vent management.    STUDIES:  N/A  SIGNIFICANT EVENTS: Thrombectomy and Peripheral tPA 6/25  HISTORY OF PRESENT ILLNESS:  Martha Clark is a [age] F  s/p remote mitral valve replacement (presumably tissue) with new Dx Afib s/p cardioversion in ED on 6/21 who [facility] with acute hemiparesis and facial droop. The following was obtained from chart review as the patient is currently intubated and sedated. On arrival she received IV tPA and was transferred to VIR for thrombectomy.   SUBJECTIVE: No events overnight, awake and following commands.  VITAL SIGNS: Temp:  [97.4 F (36.3 C)-99.2 F (37.3 C)] 97.5 F (36.4 C) (06/27 0807) Pulse Rate:  [42-125] 70 (06/27 0900) Resp:  [11-31] 19 (06/27 0900) BP: (84-123)/(24-80) 87/51 mmHg (06/27 0900) SpO2:  [94 %-100 %] 98 % (06/27 0900) HEMODYNAMICS:   VENTILATOR SETTINGS:   INTAKE / OUTPUT:  Intake/Output Summary (Last 24 hours) at 11/22/14 1016 Last data filed at 11/22/14 0900  Gross per 24 hour  Intake 2025.46 ml  Output   2250 ml  Net -224.54 ml    PHYSICAL EXAMINATION: General:  Sedated but following commands. Neuro:  Moving all ext to commands. HEENT:  Sclera anicteric, conjunctiva pink, MMM, ETT present Cardiovascular:  RRR, NS1/S2, (-) MRG Lungs:  CTAB Abdomen:  S/NT/ND/(+)BS Musculoskeletal:  Sheath in place, (-) C/C/E Skin:  Intact  LABS:  CBC  Recent Labs Lab 11/20/14 1738 11/20/14 1748 11/21/14 0615 11/22/14 0226  WBC 5.3  --  7.8  7.5  HGB 13.0 13.6 11.8* 12.9  HCT 39.5 40.0 34.9* 39.2  PLT 173  --  160 173   Coag's  Recent Labs Lab 11/20/14 1738  APTT 27  INR 1.09   BMET  Recent Labs Lab 11/20/14 1738 11/20/14 1748 11/21/14 0615 11/22/14 0226  NA 142 142 137 141  K 3.8 3.8 3.4* 3.6  CL 107 106 108 110  CO2 23  --  24 25  BUN 22* 24* 9 <5*  CREATININE 0.76 0.80 0.62 0.59  GLUCOSE 118* 117* 144* 101*   Electrolytes  Recent Labs Lab 11/20/14 1738 11/21/14 0615 11/22/14 0226  CALCIUM 8.9 8.1* 8.3*  MG  --   --  1.6*  PHOS  --   --  3.6   Sepsis Markers No results for input(s): LATICACIDVEN, PROCALCITON, O2SATVEN in the last 168 hours. ABG  Recent Labs Lab 11/20/14 2235 11/21/14 0322  PHART 7.341* 7.476*  PCO2ART 42.7 24.9*  PO2ART 182* 182*   Liver Enzymes  Recent Labs Lab 11/20/14 1738  AST 34  ALT 43  ALKPHOS 72  BILITOT 0.5  ALBUMIN 3.4*   Cardiac Enzymes No results for input(s): TROPONINI, PROBNP in the last 168 hours. Glucose  Recent Labs Lab 11/21/14 1211 11/21/14 1547 11/21/14 1927 11/21/14 2351 11/22/14 0341 11/22/14 0806  GLUCAP 111* 105* 90 95 84 107*    Imaging Mr Brain Wo Contrast  11/21/2014   EXAM: MRI HEAD WITHOUT CONTRAST  TECHNIQUE: Multiplanar, multiecho pulse sequences of  the brain and surrounding structures were obtained without intravenous contrast.  COMPARISON:  CT head initial 11/20/2014. CT head postprocedure 11/20/2014. Catheter angiogram with thrombolysis 11/20/2014.  FINDINGS: Moderate-sized area of restricted diffusion representing acute infarction, affects the RIGHT lentiform nucleus and RIGHT caudate, also involving the RIGHT external capsule, largely sparing the internal capsule and thalamus as well as sparing the insular cortex. Cytotoxic edema within the affected areas of RIGHT basal ganglia appears mildly increased from prior CT. No significant RIGHT-to-LEFT shift.  Normal cerebral volume.  No significant white matter disease.   Abnormal FLAIR signal is present in the prepontine and subarachnoid cisterns, likely acute subarachnoid blood. Focal area of susceptibility in the interpeduncular cistern on gradient sequence, also suggesting acute subarachnoid blood. These areas are not seen on prior CTs. This finding is most likely related to post tPA therapy, but there is no reperfusion parenchymal hemorrhage within the infarct.  Tiny foci of chronic hemorrhage are seen throughout the cerebral hemispheres, likely sequelae of hypertensive cerebrovascular disease. A tiny focus of susceptibility in the extreme capsule (image 15 series 9) is not suspicious for reperfusion hemorrhage.  Flow voids are maintained in the carotid, basilar, and vertebral arteries. Similarly, flow void within the RIGHT M1 MCA appears robust. Fetal origin RIGHT PCA.  Normal pituitary and cerebellar tonsils. Mega cisterna magna. Chronic and acute paranasal sinus disease, with an air-fluid level in the smaller RIGHT division of the sphenoid. Negative orbits. No osseous findings. No mastoid fluid.  IMPRESSION: Acute infarction affects much of the RIGHT basal ganglia, including the caudate and lentiform nucleus. No cortical infarction is evident.  No hemorrhagic transformation within the infarct, but acute subarachnoid blood is suspected in the prepontine and suprasellar cistern. Consider MRA intracranial for further evaluation.  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  Chronic and acute RIGHT sphenoid sinusitis.  RIGHT ICA and RIGHT MCA remain patent based on flow void appearance.  Findings discussed with ordering provider.   Electronically Signed   By: Elsie Stain M.D.   On: 11/21/2014 18:32   I reviewed MRI myself, right basal ganglia infarction with no hemorrhagic transformation.  Discussed with neurology.  ASSESSMENT / PLAN:  PULMONARY OETT 6/25>>>6/26 A:   Ventilator Dependence Asthma Hx: Details not currently available but according to med list not on controller meds so likely mild.  P:   Extubated and doing well. Titrate O2 for sats. IS per RT protocol.  CARDIOVASCULAR CVL None A:  A-flutter on monitor this AM Presumed Tissue MVR P:  Monitor EKG now Give PO cardizem and attempt to get off IV cardizem  RENAL A:  Hypokalemia P: replace K.  GASTROINTESTINAL A:  S/P CVA P: Swallow evaluation passed.  HEMATOLOGIC A:  No acute issue  INFECTIOUS A:  No acute issue  ENDOCRINE A:  Elevated Blood Glucose   P:   SSI A1c noted  NEUROLOGIC A:   CVA P:   RASS goal: 0 D/C propofol and fentanyl when ready to extubate.   FAMILY  - Updates: No family bedside.  - Inter-disciplinary family meet or Palliative Care meeting due by:  7/2  TODAY'S SUMMARY: Get off IV cardizem after giving PO cardizem, may transfer to tele, PCCM will sign off.  Alyson Reedy, M.D. [facility] Pulmonary/Critical Care Medicine. Pager: [phone]. After hours pager: [phone].  11/22/2014, 10:16 AM

## 2014-11-22 NOTE — Progress Notes (Signed)
Pt admitted to 4N30 from Neuro ICU.  Pt alert and oriented, denies pain or discomfort.  She has eyeglasses with her.  Oriented to room; call bell within reach, bed alarm set and patient verbalizes understanding to call for assistance before attempting to get out of bed.  Will continue to monitor.

## 2014-11-22 NOTE — Progress Notes (Signed)
Referring Physician(s): Sethi  Subjective:  CVA Rt MCA tpa and clot retrieval 6/26 Doing well No complaints Moving all 4s    Allergies: Review of patient's allergies indicates no known allergies.  Medications: Prior to Admission medications   Medication Sig Start Date End Date Taking? Authorizing Provider  acetaminophen (TYLENOL) 325 MG tablet Take 1,300 mg by mouth every 6 (six) hours as needed for mild pain.   Yes Historical Provider, MD  aspirin EC 81 MG tablet Take 81 mg by mouth daily.   Yes Historical Provider, MD  buPROPion (WELLBUTRIN XL) 150 MG 24 hr tablet Take 150 mg by mouth Daily.  11/25/10  Yes Historical Provider, MD  calcium citrate-vitamin D (CITRACAL+D) 315-200 MG-UNIT per tablet Take 1 tablet by mouth daily.     Yes Historical Provider, MD  Cholecalciferol (VITAMIN D) 1000 UNITS capsule Take 1,000 Units by mouth daily.     Yes Historical Provider, MD  Coenzyme Q10 (COQ-10) 100 MG CAPS Take 1 tablet by mouth daily.     Yes Historical Provider, MD  Fish Oil OIL Take 15 mLs by mouth daily.    Yes Historical Provider, MD  flecainide (TAMBOCOR) 50 MG tablet Take 0.5 tablets (25 mg total) by mouth 2 (two) times daily. 11/16/14  Yes Yates Decamp, MD  MAGNESIUM CHLORIDE PO Take 1 tablet by mouth daily.     Yes Historical Provider, MD  metoprolol succinate (TOPROL XL) 25 MG 24 hr tablet Take 1 tablet (25 mg total) by mouth daily. 11/16/14  Yes Yates Decamp, MD  Multiple Vitamin (MULTIVITAMIN WITH MINERALS) TABS tablet Take 1 tablet by mouth daily.   Yes Historical Provider, MD  vitamin C (ASCORBIC ACID) 500 MG tablet Take 500 mg by mouth daily.   Yes Historical Provider, MD     Vital Signs: BP 104/62 mmHg  Pulse 72  Temp(Src) 97.5 F (36.4 C) (Oral)  Resp 16  Ht  (1.753 m)  Wt 123 lb 7.3 oz (56 kg)  BMI 18.22 kg/m2  SpO2 98%  Physical Exam  Constitutional: She is oriented to person, place, and time.  HENT:  Smile = Face symmetrical  Eyes: EOM are normal.    Neck: Normal range of motion.  Abdominal: Soft. Bowel sounds are normal.  Musculoskeletal: Normal range of motion.  Rt groin NT; no bleeding  no hematoma  Neurological: She is alert and oriented to person, place, and time.  Psychiatric: She has a normal mood and affect. Her behavior is normal. Judgment and thought content normal.  Vitals reviewed.   Imaging: Ct Head Wo Contrast  11/20/2014   CLINICAL DATA:  Stroke. Sudden onset left-sided weakness, left facial droop, and slurred speech. Status post cerebral angiogram and revascularization of occluded right MCA.  EXAM: CT HEAD WITHOUT CONTRAST  TECHNIQUE: Contiguous axial images were obtained from the base of the skull through the vertex without intravenous contrast.  COMPARISON:  Head CT earlier today  FINDINGS: Residual vascular contrast is noted from recent cerebral angiogram. Ventricles and sulci are normal. There is new, subtle decreased attenuation involving the right caudate and anterior right lentiform nuclei consistent with early cytotoxic edema. No definite cortical edema is identified. There is no evidence of acute intracranial hemorrhage, mass, midline shift, or extra-axial fluid collection.  Orbits are unremarkable. Mastoid air cells are clear. Trace left maxillary sinus mucosal thickening is noted.  IMPRESSION: 1. Subtle edema in the right basal ganglia consistent with acute infarction. 2. No intracranial hemorrhage.   Electronically  Signed   By: Sebastian Ache   On: 11/20/2014 21:34   Ct Head Wo Contrast  11/20/2014   CLINICAL DATA:  Code stroke, sudden onset left-sided weakness, left facial droop, slurred speech  EXAM: CT HEAD WITHOUT CONTRAST  TECHNIQUE: Contiguous axial images were obtained from the base of the skull through the vertex without intravenous contrast.  COMPARISON:  None.  FINDINGS: No evidence of parenchymal hemorrhage or extra-axial fluid collection. No mass lesion, mass effect, or midline shift.  No CT evidence of  acute infarction.  Mild subcortical white matter and periventricular small vessel ischemic changes.  Cerebral volume is within normal limits.  No ventriculomegaly.  The visualized paranasal sinuses are essentially clear. The mastoid air cells are unopacified.  No evidence of calvarial fracture.  IMPRESSION: No evidence of acute intracranial abnormality.  Mild small vessel ischemic changes.  These results were called by telephone at the time of interpretation on 11/20/2014 at 5:53 pm to Dr. Leroy Kennedy, who verbally acknowledged these results.   Electronically Signed   By: Charline Bills M.D.   On: 11/20/2014 17:53   Mr Brain Wo Contrast  11/21/2014   EXAM: MRI HEAD WITHOUT CONTRAST  TECHNIQUE: Multiplanar, multiecho pulse sequences of the brain and surrounding structures were obtained without intravenous contrast.  COMPARISON:  CT head initial 11/20/2014. CT head postprocedure 11/20/2014. Catheter angiogram with thrombolysis 11/20/2014.  FINDINGS: Moderate-sized area of restricted diffusion representing acute infarction, affects the RIGHT lentiform nucleus and RIGHT caudate, also involving the RIGHT external capsule, largely sparing the internal capsule and thalamus as well as sparing the insular cortex. Cytotoxic edema within the affected areas of RIGHT basal ganglia appears mildly increased from prior CT. No significant RIGHT-to-LEFT shift.  Normal cerebral volume.  No significant white matter disease.  Abnormal FLAIR signal is present in the prepontine and subarachnoid cisterns, likely acute subarachnoid blood. Focal area of susceptibility in the interpeduncular cistern on gradient sequence, also suggesting acute subarachnoid blood. These areas are not seen on prior CTs. This finding is most likely related to post tPA therapy, but there is no reperfusion parenchymal hemorrhage within the infarct.  Tiny foci of chronic hemorrhage are seen throughout the cerebral hemispheres, likely sequelae of hypertensive  cerebrovascular disease. A tiny focus of susceptibility in the extreme capsule (image 15 series 9) is not suspicious for reperfusion hemorrhage.  Flow voids are maintained in the carotid, basilar, and vertebral arteries. Similarly, flow void within the RIGHT M1 MCA appears robust. Fetal origin RIGHT PCA.  Normal pituitary and cerebellar tonsils. Mega cisterna magna. Chronic and acute paranasal sinus disease, with an air-fluid level in the smaller RIGHT division of the sphenoid. Negative orbits. No osseous findings. No mastoid fluid.  IMPRESSION: Acute infarction affects much of the RIGHT basal ganglia, including the caudate and lentiform nucleus. No cortical infarction is evident.  No hemorrhagic transformation within the infarct, but acute subarachnoid blood is suspected in the prepontine and suprasellar cistern. Consider MRA intracranial for further evaluation.  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  Chronic and acute RIGHT sphenoid sinusitis.  RIGHT ICA and RIGHT MCA remain patent based on flow void appearance.  Findings discussed with ordering provider.   Electronically Signed   By: Elsie Stain M.D.   On: 11/21/2014 18:32   Dg Chest Port 1 View  11/20/2014   CLINICAL DATA:  Respiratory distress, post IR procedure  EXAM: PORTABLE CHEST - 1 VIEW  COMPARISON:  11/16/2014  FINDINGS: Lungs are essentially clear. No focal consolidation. No pleural effusion or pneumothorax.  Endotracheal tube at the thoracic inlet, 4 cm above the carina.  The heart is top-normal in size. Enlargement of the main pulmonary artery.  IMPRESSION: No evidence of acute cardiopulmonary disease.  Endotracheal tube terminates at the thoracic inlet, 4 cm above the carina.   Electronically Signed   By: Charline Bills M.D.   On: 11/20/2014 23:13    Labs:  CBC:  Recent Labs  11/16/14 0153 11/20/14 1738 11/20/14 1748  11/21/14 0615 11/22/14 0226  WBC 9.6 5.3  --  7.8 7.5  HGB 14.0 13.0 13.6 11.8* 12.9  HCT 41.6 39.5 40.0 34.9* 39.2  PLT 169 173  --  160 173    COAGS:  Recent Labs  11/20/14 1738  INR 1.09  APTT 27    BMP:  Recent Labs  11/16/14 0153 11/20/14 1738 11/20/14 1748 11/21/14 0615 11/22/14 0226  NA 140 142 142 137 141  K 3.9 3.8 3.8 3.4* 3.6  CL 102 107 106 108 110  CO2 30 23  --  24 25  GLUCOSE 117* 118* 117* 144* 101*  BUN 16 22* 24* 9 <5*  CALCIUM 8.8* 8.9  --  8.1* 8.3*  CREATININE 0.87 0.76 0.80 0.62 0.59  GFRNONAA >60 >60  --  >60 >60  GFRAA >60 >60  --  >60 >60    LIVER FUNCTION TESTS:  Recent Labs  11/20/14 1738  BILITOT 0.5  AST 34  ALT 43  ALKPHOS 72  PROT 5.9*  ALBUMIN 3.4*    Assessment and Plan:  CVA Rt MCA tpa/clot retrieval 6/26 Plan per Stroke team  Signed: Jimy Gates A 11/22/2014, 9:21 AM   I spent a total of 15 Minutes in face to face in clinical consultation/evaluation, greater than 50% of which was counseling/coordinating care for CVA

## 2014-11-22 NOTE — Progress Notes (Addendum)
STROKE TEAM PROGRESS NOTE   HISTORY Martha Clark is a 59 y.o. female with a past medical history significant for MVP s/p bovine mitral valve replacement, newly diagnosed atrial fibrillation s/p cardioversion in he ER on 6/21 , osteoporosis, and asthma, brought in via EMS due to acute onset of left hemiparesis, dysarthria, and left facial weakness. Patient was home with family when she fell and called out for her family in the other room and noted patient to have left side paralysis with facial droop and slurred speech. EMS was immediately summoned and patient brought to the ED. Awake and alert at initial evaluation, with NIHSS 15 and CT scan that was personally reviewed and showed no acute abnormality. She denies HA, vertigo, double vision, difficulty swallowing, or visual disturbances. No recent bleeding or surgeries. Patient is not taking anticoagulants.  Date last known well: 11/21/13 Time last known well: 1655  tPA Given: yes IV t-PA  50 mg on 11/20/2014 at 1800. NIHSS: 15   SUBJECTIVE (INTERVAL HISTORY) There is no family at the bedside. Patient is now extubated. Remains on cardizem drip. Dr. Pearlean BrownieSethi and she discussed treatment options, best anticoagulation options. She is concerned as she travels internationally a lot.    OBJECTIVE Temp:  [97.4 F (36.3 C)-99.2 F (37.3 C)] 97.5 F (36.4 C) (06/27 0807) Pulse Rate:  [42-125] 72 (06/27 0700) Cardiac Rhythm:  [-]  Resp:  [11-31] 16 (06/27 0700) BP: (84-123)/(24-80) 104/62 mmHg (06/27 0700) SpO2:  [94 %-100 %] 98 % (06/27 0700) Arterial Line BP: (136-138)/(57-59) 138/59 mmHg (06/26 1000) FiO2 (%):  [40 %] 40 % (06/26 1000)   Recent Labs Lab 11/21/14 1547 11/21/14 1927 11/21/14 2351 11/22/14 0341 11/22/14 0806  GLUCAP 105* 90 95 84 107*    Recent Labs Lab 11/16/14 0153 11/20/14 1738 11/20/14 1748 11/21/14 0615 11/22/14 0226  NA 140 142 142 137 141  K 3.9 3.8 3.8 3.4* 3.6  CL 102 107 106 108 110  CO2 30 23  --   24 25  GLUCOSE 117* 118* 117* 144* 101*  BUN 16 22* 24* 9 <5*  CREATININE 0.87 0.76 0.80 0.62 0.59  CALCIUM 8.8* 8.9  --  8.1* 8.3*  MG  --   --   --   --  1.6*  PHOS  --   --   --   --  3.6    Recent Labs Lab 11/20/14 1738  AST 34  ALT 43  ALKPHOS 72  BILITOT 0.5  PROT 5.9*  ALBUMIN 3.4*    Recent Labs Lab 11/16/14 0153 11/20/14 1738 11/20/14 1748 11/21/14 0615 11/22/14 0226  WBC 9.6 5.3  --  7.8 7.5  NEUTROABS  --  2.7  --  5.9  --   HGB 14.0 13.0 13.6 11.8* 12.9  HCT 41.6 39.5 40.0 34.9* 39.2  MCV 87.0 86.1  --  84.5 85.8  PLT 169 173  --  160 173   No results for input(s): CKTOTAL, CKMB, CKMBINDEX, TROPONINI in the last 168 hours.  Recent Labs  11/20/14 1738  LABPROT 14.3  INR 1.09    Recent Labs  11/20/14 1835  COLORURINE YELLOW  LABSPEC 1.027  PHURINE 7.0  GLUCOSEU NEGATIVE  HGBUR SMALL*  BILIRUBINUR NEGATIVE  KETONESUR 15*  PROTEINUR NEGATIVE  UROBILINOGEN 1.0  NITRITE NEGATIVE  LEUKOCYTESUR NEGATIVE       Component Value Date/Time   CHOL 127 11/21/2014 0615   TRIG 65 11/21/2014 0615   HDL 38* 11/21/2014 0615   CHOLHDL  3.3 11/21/2014 0615   VLDL 13 11/21/2014 0615   LDLCALC 76 11/21/2014 0615   No results found for: HGBA1C No results found for: LABOPIA, COCAINSCRNUR, LABBENZ, AMPHETMU, THCU, LABBARB  No results for input(s): ETH in the last 168 hours.   Ct Head Wo Contrast 11/20/2014    1. Subtle edema in the right basal ganglia consistent with acute infarction.  2. No intracranial hemorrhage.    11/20/2014    No evidence of acute intracranial abnormality.  Mild small vessel ischemic changes.    Cerebral Angiogram complete revascularization of occluded RT MCA with  of superselective TPA and x1 pass with the [place] FR 4mm x 40 mm retrieval device With TICI 3 revascularization  Mr Brain Wo Contrast 11/21/2014   Acute infarction affects much of the RIGHT basal ganglia, including the caudate and lentiform nucleus. No cortical  infarction is evident.  No hemorrhagic transformation within the infarct, but acute subarachnoid blood is suspected in the prepontine and suprasellar cistern. 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  Chronic and acute RIGHT sphenoid sinusitis.  RIGHT ICA and RIGHT MCA remain patent based on flow void appearance.   Dg Chest Port 1 View 11/20/2014    No evidence of acute cardiopulmonary disease.  Endotracheal tube terminates at the thoracic inlet, 4 cm above the carina.     2D Echocardiogram  EF 55-60% with no source of embolus.    Physical exam: Exam: Gen: pleasant young caucasian lady Eyes: anicteric sclerae, moist conjunctivae                    CV: no MRG, no carotid bruits, no peripheral edema Mental Status: Alert, follows commands    Neurologic Exam Awake alert oriented 3 with normal speech and language function. No aphasia, apraxia or dysarthria. Follows commands well.      Cranial Nerves:    The pupils are equal, round, and reactive to light. Fundi flat.  EOMI. No gaze preference. Visual fields full. Face appears symmetric but difficult to assess due to tubing, Tongue appears midline but again difficult to assess due to tubing. Hearing intact to voice. Shoulder shrug intact.   Motor Observation:    no involuntary movements noted. Tone appears normal.     Strength:   No drift. Diminished fine finger movements on the left. Mild weakness of left grip, intrinsic hand muscles and left hip flexors and ankle dorsiflexors only.     Sensation:  Intact to LT and pin prick x 4  Plantars downgoing.   DTRs: normal and symmetric  Coordination: normal  NIHSS 0 ASSESSMENT/PLAN Martha Clark is a 59 y.o. female with history of  mitral valve prolapse, status post bovine mitral valve replacement, newly diagnosed atrial fibrillation, status post cardioversion in the emergency  department on 11/16/2014, osteoporosis, and asthma presenting with left hemiparesis, left facial weakness, and dysarthria. She did receive IV t-PA  50 mg on 11/20/2014 at 1800. She appears to be making a remarkable recovery from her admission.  Stroke:  Non-dominant R MCA embolic infarct secondary known to atrial fibrillation, s/p IV tPA and  revascularization to TICI3 with IA tPA ans Solitaire device  Resultant  Mild left arm drift with mild left arm dysmetria now improved   MRI  R MCA -  basal ganglia, including the caudate and lentiform nucleus. acute subarachnoid blood is suspected in the prepontine and suprasellar cistern. 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 tissue valve in the MV position.  LDL 76  HgbA1c pending   SCDs for VTE prophylaxis Diet clear liquid Room service appropriate?: Yes; Fluid consistency:: Thin  aspirin 81 mg orally every day prior to admission, now on no antithrombotic at this time. Did receive post tPA aspirin suppository. Will start eliquis 5 mg bid now for anticoagulation given atrial fibrillation.  Ongoing aggressive stroke risk factor management  Therapy recommendations:  Pending. Ok to be OOB  Disposition: Pending  Atrial Fibrillation with RVR  Home anticoagulation:  None  Currently on cardizem drip with plans to change to PO  In and out of atrial fibrillation today.  TEE to better evaluate left atrial appendage   CHA2DS2-VASc Score = 4, ?2 oral anticoagulation recommended  Age in Years:  <65   0    Sex:  Female  Female   +1    Hypertension History:  yes   +1     Diabetes Mellitus:  0   Congestive Heart Failure History:  0  Vascular Disease History:  0     Stroke/TIA/Thromboembolism History:  yes   +2  Add eliquis 5 mg bid, started now.  Hypertension  Home meds:  Metoprolol  Blood pressures continue to run low   Discussed with cardiology - will hold flecanide and metoprolol for  now   Hyperlipidem cdia  Home meds: No lipid lowering medications prior to admission  LDL 76, goal < 70  add low-dose Lipitor 10 mg daily  Continue statin at discharge  Other Stroke Risk Factors  Family hx stroke (Father)  Other Active Problems  Mild anemia, resolved  Mild hypokalemia, resolved  hypokalemia  Mildly elevated glucose  Hospital day # 2  Martha Clark [facility] See Amion for Pager information 11/22/2014 9:08 AM  I have personally examined this patient, reviewed notes, independently viewed imaging studies, participated in medical decision making and plan of care. I have made any additions or clarifications directly to the above note. Agree with note above. She presented with a right carotid occlusion and got IV tPA followed by mechanical embolectomy with full recanalization. She's done clinically well with only mild left-sided residual weakness. MRI scan shows possibly small subarachnoid hemorrhage and the supraclinoid and prepontine cistern and hold off anticoagulation for now. Will repeat MRI in a few days before starting anticoagulation and recommend eliquis in the future. I had a long discussion with the patient regarding the stroke, ongoing evaluation tests and answered questions. Discussed with Dr. Hermelinda Medicus, Deveshwar and Dr. Twanna Hy This patient is critically ill and at significant risk of neurological worsening, death and care requires constant monitoring of vital signs, hemodynamics,respiratory and cardiac monitoring, extensive review of multiple databases, frequent neurological assessment, discussion with family, other specialists and medical decision making of high complexity.I have made any additions or clarifications directly to the above note.This critical care time does not reflect procedure time, or teaching time or supervisory time of PA/NP/Med Resident etc but could involve care discussion time.  I spent 40 minutes of neurocritical care  time  in the care of  this patient.    Delia Heady, MD Medical Director [facility] Pager: [phone] 11/22/2014 2:02 PM ADDENDUM : MRI brain report mentioned possible small SAH in prepontine and supraclinoid region but CT post intervention did not. Hence obtained CT angio after d/w Dr Ermelinda Das which showed no aneurysm/AVM and no SAH making MRI finding questionable hence will continue anticoagulation with  Eliquis  Delia Heady, MD 11/22/14 at 1700 To contact Stroke Continuity provider, please refer to [email or web address]. After hours, contact General Neurology

## 2014-11-22 NOTE — Evaluation (Signed)
Speech Language Pathology Evaluation Patient Details Name: Elson AreasJoyce P Tolbert MRN: [id number] DOB: 12/12/1955 Today's Date: 11/22/2014 Time: [id number] SLP Time Calculation (min) (ACUTE ONLY): 39 min  Problem List:  Patient Active Problem List   Diagnosis Date Noted  . Stroke   . Stroke with cerebral ischemia 11/20/2014  . Atrial flutter 11/16/2014  . Chest pain on respiration 10/07/2011  . ASTHMA, UNSPECIFIED, UNSPECIFIED STATUS 05/30/2009  . OTHER OSTEOPOROSIS 05/30/2009  . MITRAL VALVE REPLACEMENT, HX OF 05/30/2009   Past Medical History:  Past Medical History  Diagnosis Date  . Atrial fibrillation   . Asthma   . Osteoporosis   . Mitral valve prolapse    Past Surgical History:  Past Surgical History  Procedure Laterality Date  . Tubal ligation    . Mitral valve replacement  20048   HPI:  59 year old female admitted with left hemiparesis, left facial weakness, and dysarthria. MRI shows acute infarct in the right basal ganglia, acute SAH suspected in the prepontine and suprasellar cistern.    Assessment / Plan / Recommendation Clinical Impression  Cognitive-linguistic evaluation complete. Patient scoring 25 out of 30 on version 7.1 of the MOCA with impairements noted in the areas of high level executive functioning only. No acute SLP needs indicated however recommend OP f/u after d/c. Patient and family in agreement.   Note orders received for swallow evaluation. Patient has passed the RN stroke swallow screen, diet initiated, and per patient and RN, doing well without evidence of dysphagia or aspiration. Defer formal order per protocol.    SLP Assessment  All further Speech Lanaguage Pathology  needs can be addressed in the next venue of care    Follow Up Recommendations  Outpatient SLP       Pertinent Vitals/Pain Pain Assessment: No/denies pain   SLP Goals     SLP Evaluation Prior Functioning  Cognitive/Linguistic Baseline: Within functional limits Type of Home:  House  Lives With: Spouse;Daughter Vocation: Full time employment   Cognition  Overall Cognitive Status: Impaired/Different from baseline Arousal/Alertness: Awake/alert Orientation Level: Oriented to person;Oriented to place;Oriented to situation Attention: Sustained Sustained Attention: Appears intact Memory: Appears intact Awareness: Appears intact Problem Solving: Appears intact Executive Function: Reasoning Reasoning: Impaired Reasoning Impairment: Functional complex Safety/Judgment: Appears intact    Comprehension  Auditory Comprehension Overall Auditory Comprehension: Appears within functional limits for tasks assessed Visual Recognition/Discrimination Discrimination: Within Function Limits Reading Comprehension Reading Status: Within funtional limits    Expression Expression Primary Mode of Expression: Verbal Verbal Expression Overall Verbal Expression: Appears within functional limits for tasks assessed Written Expression Dominant Hand: Right   Oral / Motor Oral Motor/Sensory Function Overall Oral Motor/Sensory Function: Appears within functional limits for tasks assessed Motor Speech Overall Motor Speech: Appears within functional limits for tasks assessed   GO    Ferdinand LangoLeah Danetta Prom MA, CCC-SLP [phone]  Chrles Selley Meryl 11/22/2014, 10:00 AM

## 2014-11-22 NOTE — Progress Notes (Signed)
Subjective:  Pt extubated, reports "heaviness" in left arm and leg but normal movement and strength. Denies any complaints presently aside from left arm and shoulder pain from falling.  Denies chest pain, SOB, or palpitations.   Objective:  Vital Signs in the last 24 hours: Temp:  [97.4 F (36.3 C)-99.2 F (37.3 C)] 97.5 F (36.4 C) (06/27 0807) Pulse Rate:  [42-125] 70 (06/27 0900) Resp:  [11-31] 19 (06/27 0900) BP: (84-123)/(24-80) 87/51 mmHg (06/27 0900) SpO2:  [94 %-100 %] 98 % (06/27 0900) Arterial Line BP: (138)/(59) 138/59 mmHg (06/26 1000) FiO2 (%):  [40 %] 40 % (06/26 1000)  Intake/Output from previous day: 06/26 0701 - 06/27 0700 In: 1896.2 [I.V.:1896.2] Out: 2620 [Urine:2620]  Physical Exam: General appearance: alert, cooperative, appears stated age and no distress Eyes: negative Neck: no adenopathy, no carotid bruit, no JVD, supple, symmetrical, trachea midline and thyroid not enlarged, symmetric, no tenderness/mass/nodules Resp: clear to auscultation bilaterally Chest wall: no tenderness Cardio: S1: irregular, S2: decreased intensity, no S3 or S4 and I/IV systolic murmur GI: soft, non-tender; bowel sounds normal; no masses,  no organomegaly Extremities: extremities normal, atraumatic, no cyanosis or edema  Lab Results: BMP  Recent Labs  11/20/14 1738 11/20/14 1748 11/21/14 0615 11/22/14 0226  NA 142 142 137 141  K 3.8 3.8 3.4* 3.6  CL 107 106 108 110  CO2 23  --  24 25  GLUCOSE 118* 117* 144* 101*  BUN 22* 24* 9 <5*  CREATININE 0.76 0.80 0.62 0.59  CALCIUM 8.9  --  8.1* 8.3*  GFRNONAA >60  --  >60 >60  GFRAA >60  --  >60 >60    CBC  Recent Labs Lab 11/21/14 0615 11/22/14 0226  WBC 7.8 7.5  RBC 4.13 4.57  HGB 11.8* 12.9  HCT 34.9* 39.2  PLT 160 173  MCV 84.5 85.8  MCH 28.6 28.2  MCHC 33.8 32.9  RDW 12.4 12.5  LYMPHSABS 1.3  --   MONOABS 0.5  --   EOSABS 0.1  --   BASOSABS 0.0  --     HEMOGLOBIN A1C No results found for: HGBA1C,  MPG  Cardiac Panel (last 3 results) No results for input(s): CKTOTAL, CKMB, TROPONINI, RELINDX in the last 8760 hours.  BNP (last 3 results) No results for input(s): PROBNP in the last 8760 hours.  TSH No results for input(s): TSH in the last 8760 hours.  CHOLESTEROL  Recent Labs  11/21/14 0615  CHOL 127    Hepatic Function Panel  Recent Labs  11/20/14 1738  PROT 5.9*  ALBUMIN 3.4*  AST 34  ALT 43  ALKPHOS 72  BILITOT 0.5    Imaging: Ct Head Wo Contrast  11/20/2014   CLINICAL DATA:  Stroke. Sudden onset left-sided weakness, left facial droop, and slurred speech. Status post cerebral angiogram and revascularization of occluded right MCA.  EXAM: CT HEAD WITHOUT CONTRAST  TECHNIQUE: Contiguous axial images were obtained from the base of the skull through the vertex without intravenous contrast.  COMPARISON:  Head CT earlier today  FINDINGS: Residual vascular contrast is noted from recent cerebral angiogram. Ventricles and sulci are normal. There is new, subtle decreased attenuation involving the right caudate and anterior right lentiform nuclei consistent with early cytotoxic edema. No definite cortical edema is identified. There is no evidence of acute intracranial hemorrhage, mass, midline shift, or extra-axial fluid collection.  Orbits are unremarkable. Mastoid air cells are clear. Trace left maxillary sinus mucosal thickening is noted.  IMPRESSION: 1. Subtle  edema in the right basal ganglia consistent with acute infarction. 2. No intracranial hemorrhage.   Electronically Signed   By: Sebastian AcheAllen  Grady   On: 11/20/2014 21:34   Ct Head Wo Contrast  11/20/2014   CLINICAL DATA:  Code stroke, sudden onset left-sided weakness, left facial droop, slurred speech  EXAM: CT HEAD WITHOUT CONTRAST  TECHNIQUE: Contiguous axial images were obtained from the base of the skull through the vertex without intravenous contrast.  COMPARISON:  None.  FINDINGS: No evidence of parenchymal hemorrhage or  extra-axial fluid collection. No mass lesion, mass effect, or midline shift.  No CT evidence of acute infarction.  Mild subcortical white matter and periventricular small vessel ischemic changes.  Cerebral volume is within normal limits.  No ventriculomegaly.  The visualized paranasal sinuses are essentially clear. The mastoid air cells are unopacified.  No evidence of calvarial fracture.  IMPRESSION: No evidence of acute intracranial abnormality.  Mild small vessel ischemic changes.  These results were called by telephone at the time of interpretation on 11/20/2014 at 5:53 pm to Dr. Leroy Kennedyamilo, who verbally acknowledged these results.   Electronically Signed   By: Charline BillsSriyesh  Krishnan M.D.   On: 11/20/2014 17:53   Mr Brain Wo Contrast  11/21/2014   EXAM: MRI HEAD WITHOUT CONTRAST  TECHNIQUE: Multiplanar, multiecho pulse sequences of the brain and surrounding structures were obtained without intravenous contrast.  COMPARISON:  CT head initial 11/20/2014. CT head postprocedure 11/20/2014. Catheter angiogram with thrombolysis 11/20/2014.  FINDINGS: Moderate-sized area of restricted diffusion representing acute infarction, affects the RIGHT lentiform nucleus and RIGHT caudate, also involving the RIGHT external capsule, largely sparing the internal capsule and thalamus as well as sparing the insular cortex. Cytotoxic edema within the affected areas of RIGHT basal ganglia appears mildly increased from prior CT. No significant RIGHT-to-LEFT shift.  Normal cerebral volume.  No significant white matter disease.  Abnormal FLAIR signal is present in the prepontine and subarachnoid cisterns, likely acute subarachnoid blood. Focal area of susceptibility in the interpeduncular cistern on gradient sequence, also suggesting acute subarachnoid blood. These areas are not seen on prior CTs. This finding is most likely related to post tPA therapy, but there is no reperfusion parenchymal hemorrhage within the infarct.  Tiny foci of chronic  hemorrhage are seen throughout the cerebral hemispheres, likely sequelae of hypertensive cerebrovascular disease. A tiny focus of susceptibility in the extreme capsule (image 15 series 9) is not suspicious for reperfusion hemorrhage.  Flow voids are maintained in the carotid, basilar, and vertebral arteries. Similarly, flow void within the RIGHT M1 MCA appears robust. Fetal origin RIGHT PCA.  Normal pituitary and cerebellar tonsils. Mega cisterna magna. Chronic and acute paranasal sinus disease, with an air-fluid level in the smaller RIGHT division of the sphenoid. Negative orbits. No osseous findings. No mastoid fluid.  IMPRESSION: Acute infarction affects much of the RIGHT basal ganglia, including the caudate and lentiform nucleus. No cortical infarction is evident.  No hemorrhagic transformation within the infarct, but acute subarachnoid blood is suspected in the prepontine and suprasellar cistern. Consider MRA intracranial for further evaluation.  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  Chronic and acute RIGHT sphenoid sinusitis.  RIGHT ICA and RIGHT MCA remain patent based on flow void appearance.  Findings discussed with ordering provider.   Electronically Signed   By: Elsie StainJohn T Curnes M.D.   On: 11/21/2014 18:32   Dg Chest Port 1 View  11/20/2014   CLINICAL DATA:  Respiratory distress, post IR procedure  EXAM: PORTABLE CHEST - 1 VIEW  COMPARISON:  11/16/2014  FINDINGS: Lungs are essentially clear. No focal consolidation. No pleural effusion or pneumothorax.  Endotracheal tube at the thoracic inlet, 4 cm above the carina.  The heart is top-normal in size. Enlargement of the main pulmonary artery.  IMPRESSION: No evidence of acute cardiopulmonary disease.  Endotracheal tube terminates at the thoracic inlet, 4 cm above the carina.   Electronically Signed   By: Charline Bills M.D.   On: 11/20/2014 23:13    Cardiac Studies:  Echocardiogram 11/21/2014: Left ventricle: The cavity size was normal. Wall thickness was increased in a pattern of mild LVH. Systolic function was normal. The estimated ejection fraction was in the range of 55% to 60%. Wall motion was normal; there were no regional wall motion abnormalities. Aortic valve: Valve area (VTI): 1.92 cm^2. Valve area (Vmax): 1.98 cm^2. Mitral valve: There is a tissue valve in the MV position, unknowntype. Severely calcified annulus. Normal thickness leaflets. There was mild regurgitation. Left atrium: The atrium was moderately dilated. Atrial septum: No defect or patent foramen ovale was identified.  EKG 11/22/2014 (continuous monitor): atrial flutter with variable 2:1 and 3:1 conduction, rate controlled at 80-90.  Assessment/Plan:  1. Afib/aflutter: presently in A. Flutter with variable 2:1 and 3:1 conduction at a controlled rate. CHA2DS2-VASCScore: Risk Score  3,  Yearly risk of stroke  3.2%. Recommendation: ASA: no/Anticoagulation: yes; Eliquis  BID.   OAC Has Bled: Score 1.  Estimated risk of major bleeding at 1 year with OAC 1.02-1.5%. 2. Acute CVA: no deficits post tPA 3. H/O mitral valve replacement with bioprosthetic bovine valve in 2008  Recommendation: Change cardizem gtt to PO cardizem as patient has now been extubated and is mildly hypotensive on cardizem gtt.  Would recommend initiation of oral anticoagulation as soon as possible to prevent recurrent thromboembolic event. Spoke with neurology who would like to begin Eliquis  bid.     Erling Conte, NP-C 11/22/2014, 9:07 AM [facility] Pager: [phone] Office: [phone]

## 2014-11-23 DIAGNOSIS — I6340 Cerebral infarction due to embolism of unspecified cerebral artery: Secondary | ICD-10-CM

## 2014-11-23 LAB — BASIC METABOLIC PANEL
Anion gap: 3 — ABNORMAL LOW (ref 5–15)
BUN: <5 mg/dL — ABNORMAL LOW (ref 6–20)
CO2: 28 mmol/L (ref 22–32)
Calcium: 8 mg/dL — ABNORMAL LOW (ref 8.9–10.3)
Chloride: 107 mmol/L (ref 101–111)
Creatinine, Ser: 0.56 mg/dL (ref 0.44–1.00)
GFR calc Af Amer: >60 mL/min (ref >60)
GFR calc non Af Amer: >60 mL/min (ref >60)
Glucose, Bld: 102 mg/dL — ABNORMAL HIGH (ref 65–99)
Potassium: 4.2 mmol/L (ref 3.5–5.1)
Sodium: 138 mmol/L (ref 135–145)

## 2014-11-23 LAB — PHOSPHORUS: Phosphorus: 2.7 mg/dL (ref 2.5–4.6)

## 2014-11-23 LAB — CBC
HCT: 39.5 % (ref 36.0–46.0)
Hemoglobin: 13 g/dL (ref 12.0–15.0)
MCH: 29 pg (ref 26.0–34.0)
MCHC: 32.9 g/dL (ref 30.0–36.0)
MCV: 88 fL (ref 78.0–100.0)
Platelets: 178 10*3/uL (ref 150–400)
RBC: 4.49 MIL/uL (ref 3.87–5.11)
RDW: 12.7 % (ref 11.5–15.5)
WBC: 6.8 10*3/uL (ref 4.0–10.5)

## 2014-11-23 LAB — MAGNESIUM: Magnesium: 1.8 mg/dL (ref 1.7–2.4)

## 2014-11-23 MED ORDER — METOPROLOL TARTRATE 1 MG/ML IV SOLN
5.0000 mg | Freq: Once | INTRAVENOUS | Status: AC
  Administered 2014-11-23: 5 mg via INTRAVENOUS

## 2014-11-23 MED ORDER — METOPROLOL TARTRATE 1 MG/ML IV SOLN
INTRAVENOUS | Status: AC
  Administered 2014-11-23: 14:00:00
  Filled 2014-11-23: qty 5

## 2014-11-23 MED ORDER — METOPROLOL TARTRATE 12.5 MG HALF TABLET
12.5000 mg | ORAL_TABLET | Freq: Four times a day (QID) | ORAL | Status: DC
  Administered 2014-11-23 (×2): 12.5 mg via ORAL
  Filled 2014-11-23: qty 1

## 2014-11-23 MED ORDER — METOPROLOL TARTRATE 1 MG/ML IV SOLN: 2.5000 mg | INTRAVENOUS | Status: DC | PRN

## 2014-11-23 NOTE — Discharge Instructions (Addendum)
Information on my medicine - ELIQUIS (apixaban)  This medication education was reviewed with me or my healthcare representative as part of my discharge preparation.   Why was Eliquis prescribed for you? Eliquis was prescribed for you to reduce the risk of a blood clot forming that can cause a stroke if you have a medical condition called atrial fibrillation (a type of irregular heartbeat).  What do You need to know about Eliquis ? Take your Eliquis TWICE DAILY - one tablet in the morning and one tablet in the evening with or without food. If you have difficulty swallowing the tablet whole please discuss with your pharmacist how to take the medication safely.  Take Eliquis exactly as prescribed by your doctor and DO NOT stop taking Eliquis without talking to the doctor who prescribed the medication.  Stopping may increase your risk of developing a stroke.  Refill your prescription before you run out.  After discharge, you should have regular check-up appointments with your healthcare provider that is prescribing your Eliquis.  In the future your dose may need to be changed if your kidney function or weight changes by a significant amount or as you get older.  What do you do if you miss a dose? If you miss a dose, take it as soon as you remember on the same day and resume taking twice daily.  Do not take more than one dose of ELIQUIS at the same time to make up a missed dose.  Important Safety Information A possible side effect of Eliquis is bleeding. You should call your healthcare provider right away if you experience any of the following: ? Bleeding from an injury or your nose that does not stop. ? Unusual colored urine (red or dark brown) or unusual colored stools (red or black). ? Unusual bruising for unknown reasons. ? A serious fall or if you hit your head (even if there is no bleeding).  Some medicines may interact with Eliquis and might increase your risk of bleeding or  clotting while on Eliquis. To help avoid this, consult your healthcare provider or pharmacist prior to using any new prescription or non-prescription medications, including herbals, vitamins, non-steroidal anti-inflammatory drugs (NSAIDs) and supplements.  This website has more information on Eliquis (apixaban): http://www.eliquis.com/eliquis/home  Metoprolol tablets What is this medicine? METOPROLOL (me TOE proe lole) is a beta-blocker. Beta-blockers reduce the workload on the heart and help it to beat more regularly. This medicine is used to treat high blood pressure and to prevent chest pain. It is also used to after a heart attack and to prevent an additional heart attack from occurring. This medicine may be used for other purposes; ask your health care provider or pharmacist if you have questions. COMMON BRAND NAME(S): Lopressor What should I tell my health care provider before I take this medicine? They need to know if you have any of these conditions: -diabetes -heart or vessel disease like slow heart rate, worsening heart failure, heart block, sick sinus syndrome or Raynaud's disease -kidney disease -liver disease -lung or breathing disease, like asthma or emphysema -pheochromocytoma -thyroid disease -an unusual or allergic reaction to metoprolol, other beta-blockers, medicines, foods, dyes, or preservatives -pregnant or trying to get pregnant -breast-feeding How should I use this medicine? Take this medicine by mouth with a drink of water. Follow the directions on the prescription label. Take this medicine immediately after meals. Take your doses at regular intervals. Do not take more medicine than directed. Do not stop taking this medicine  suddenly. This could lead to serious heart-related effects. Talk to your pediatrician regarding the use of this medicine in children. Special care may be needed. Overdosage: If you think you have taken too much of this medicine contact a poison  control center or emergency room at once. NOTE: This medicine is only for you. Do not share this medicine with others. What if I miss a dose? If you miss a dose, take it as soon as you can. If it is almost time for your next dose, take only that dose. Do not take double or extra doses. What may interact with this medicine? This medicine may interact with the following medications: -certain medicines for blood pressure, heart disease, irregular heart beat -certain medicines for depression like monoamine oxidase (MAO) inhibitors, fluoxetine, or paroxetine -clonidine -dobutamine -epinephrine -isoproterenol -reserpine This list may not describe all possible interactions. Give your health care provider a list of all the medicines, herbs, non-prescription drugs, or dietary supplements you use. Also tell them if you smoke, drink alcohol, or use illegal drugs. Some items may interact with your medicine. What should I watch for while using this medicine? Visit your doctor or health care professional for regular check ups. Contact your doctor right away if your symptoms worsen. Check your blood pressure and pulse rate regularly. Ask your health care professional what your blood pressure and pulse rate should be, and when you should contact them. You may get drowsy or dizzy. Do not drive, use machinery, or do anything that needs mental alertness until you know how this medicine affects you. Do not sit or stand up quickly, especially if you are an older patient. This reduces the risk of dizzy or fainting spells. Contact your doctor if these symptoms continue. Alcohol may interfere with the effect of this medicine. Avoid alcoholic drinks. What side effects may I notice from receiving this medicine? Side effects that you should report to your doctor or health care professional as soon as possible: -allergic reactions like skin rash, itching or hives -cold or numb hands or feet -depression -difficulty  breathing -faint -fever with sore throat -irregular heartbeat, chest pain -rapid weight gain -swollen legs or ankles Side effects that usually do not require medical attention (report to your doctor or health care professional if they continue or are bothersome): -anxiety or nervousness -change in sex drive or performance -dry skin -headache -nightmares or trouble sleeping -short term memory loss -stomach upset or diarrhea -unusually tired This list may not describe all possible side effects. Call your doctor for medical advice about side effects. You may report side effects to FDA at 1-800-FDA-1088. Where should I keep my medicine? Keep out of the reach of children. Store at room temperature between 15 and 30 degrees C (59 and 86 degrees F). Throw away any unused medicine after the expiration date. NOTE: This sheet is a summary. It may not cover all possible information. If you have questions about this medicine, talk to your doctor, pharmacist, or health care provider.  2015, Elsevier/Gold Standard. (2013-01-16 14:40:36)  Atorvastatin tablets What is this medicine? ATORVASTATIN (a TORE va sta tin) is known as a HMG-CoA reductase inhibitor or 'statin'. It lowers the level of cholesterol and triglycerides in the blood. This drug may also reduce the risk of heart attack, stroke, or other health problems in patients with risk factors for heart disease. Diet and lifestyle changes are often used with this drug. This medicine may be used for other purposes; ask your health care provider  or pharmacist if you have questions. COMMON BRAND NAME(S): Lipitor What should I tell my health care provider before I take this medicine? They need to know if you have any of these conditions: -frequently drink alcoholic beverages -history of stroke, TIA -kidney disease -liver disease -muscle aches or weakness -other medical condition -an unusual or allergic reaction to atorvastatin, other medicines,  foods, dyes, or preservatives -pregnant or trying to get pregnant -breast-feeding How should I use this medicine? Take this medicine by mouth with a glass of water. Follow the directions on the prescription label. You can take this medicine with or without food. Take your doses at regular intervals. Do not take your medicine more often than directed. Talk to your pediatrician regarding the use of this medicine in children. While this drug may be prescribed for children as young as 64 years old for selected conditions, precautions do apply. Overdosage: If you think you have taken too much of this medicine contact a poison control center or emergency room at once. NOTE: This medicine is only for you. Do not share this medicine with others. What if I miss a dose? If you miss a dose, take it as soon as you can. If it is almost time for your next dose, take only that dose. Do not take double or extra doses. What may interact with this medicine? Do not take this medicine with any of the following medications: -red yeast rice -telaprevir -telithromycin -voriconazole This medicine may also interact with the following medications: -alcohol -antiviral medicines for HIV or AIDS -boceprevir -certain antibiotics like clarithromycin, erythromycin, troleandomycin -certain medicines for cholesterol like fenofibrate or gemfibrozil -cimetidine -clarithromycin -colchicine -cyclosporine -digoxin -female hormones, like estrogens or progestins and birth control pills -grapefruit juice -medicines for fungal infections like fluconazole, itraconazole, ketoconazole -niacin -rifampin -spironolactone This list may not describe all possible interactions. Give your health care provider a list of all the medicines, herbs, non-prescription drugs, or dietary supplements you use. Also tell them if you smoke, drink alcohol, or use illegal drugs. Some items may interact with your medicine. What should I watch for  while using this medicine? Visit your doctor or health care professional for regular check-ups. You may need regular tests to make sure your liver is working properly. Tell your doctor or health care professional right away if you get any unexplained muscle pain, tenderness, or weakness, especially if you also have a fever and tiredness. Your doctor or health care professional may tell you to stop taking this medicine if you develop muscle problems. If your muscle problems do not go away after stopping this medicine, contact your health care professional. This drug is only part of a total heart-health program. Your doctor or a dietician can suggest a low-cholesterol and low-fat diet to help. Avoid alcohol and smoking, and keep a proper exercise schedule. Do not use this drug if you are pregnant or breast-feeding. Serious side effects to an unborn child or to an infant are possible. Talk to your doctor or pharmacist for more information. This medicine may affect blood sugar levels. If you have diabetes, check with your doctor or health care professional before you change your diet or the dose of your diabetic medicine. If you are going to have surgery tell your health care professional that you are taking this drug. What side effects may I notice from receiving this medicine? Side effects that you should report to your doctor or health care professional as soon as possible: -allergic reactions like skin rash,  itching or hives, swelling of the face, lips, or tongue -dark urine -fever -joint pain -muscle cramps, pain -redness, blistering, peeling or loosening of the skin, including inside the mouth -trouble passing urine or change in the amount of urine -unusually weak or tired -yellowing of eyes or skin Side effects that usually do not require medical attention (report to your doctor or health care professional if they continue or are bothersome): -constipation -heartburn -stomach gas, pain,  upset This list may not describe all possible side effects. Call your doctor for medical advice about side effects. You may report side effects to FDA at 1-800-FDA-1088. Where should I keep my medicine? Keep out of the reach of children. Store at room temperature between 20 to 25 degrees C (68 to 77 degrees F). Throw away any unused medicine after the expiration date. NOTE: This sheet is a summary. It may not cover all possible information. If you have questions about this medicine, talk to your doctor, pharmacist, or health care provider.  2015, Elsevier/Gold Standard. (2011-04-03 09:18:24)  STROKE/TIA DISCHARGE INSTRUCTIONS SMOKING Cigarette smoking nearly doubles your risk of having a stroke & is the single most alterable risk factor  If you smoke or have smoked in the last 12 months, you are advised to quit smoking for your health.  Most of the excess cardiovascular risk related to smoking disappears within a year of stopping.  Ask you doctor about anti-smoking medications  [place] Quit Line: 1-800-QUIT NOW  Free Smoking Cessation Classes [phone]  CHOLESTEROL Know your levels; limit fat & cholesterol in your diet  Lipid Panel     Component Value Date/Time   CHOL 127 11/21/2014 0615   TRIG 65 11/21/2014 0615   HDL 38* 11/21/2014 0615   CHOLHDL 3.3 11/21/2014 0615   VLDL 13 11/21/2014 0615   LDLCALC 76 11/21/2014 0615      Many patients benefit from treatment even if their cholesterol is at goal.  Goal: Total Cholesterol (CHOL) less than 160  Goal:  Triglycerides (TRIG) less than 150  Goal:  HDL greater than 40  Goal:  LDL (LDLCALC) less than 100   BLOOD PRESSURE American Stroke Association blood pressure target is less that 120/80 mm/Hg  Your discharge blood pressure is:  BP: 105/65 mmHg  Monitor your blood pressure  Limit your salt and alcohol intake  Many individuals will require more than one medication for high blood pressure  DIABETES (A1c is a blood sugar  average for last 3 months) Goal HGBA1c is under 7% (HBGA1c is blood sugar average for last 3 months)  Diabetes: No known diagnosis of diabetes    Lab Results  Component Value Date   HGBA1C 5.3 11/21/2014     Your HGBA1c can be lowered with medications, healthy diet, and exercise.  Check your blood sugar as directed by your physician  Call your physician if you experience unexplained or low blood sugars.  PHYSICAL ACTIVITY/REHABILITATION Goal is 30 minutes at least 4 days per week  Activity: Increase activity slowly, Therapies: Occupational Therapy: Outpatient   Activity decreases your risk of heart attack and stroke and makes your heart stronger.  It helps control your weight and blood pressure; helps you relax and can improve your mood.  Participate in a regular exercise program.  Talk with your doctor about the best form of exercise for you (dancing, walking, swimming, cycling).  DIET/WEIGHT Goal is to maintain a healthy weight  Your discharge diet is: Diet vegetarian Room service appropriate?: Yes; Fluid consistency::  Thin thin liquids Your height is:  Height: 5\' 9"  (175.3 cm) Your current weight is: Weight: 56 kg (123 lb 7.3 oz) Your Body Mass Index (BMI) is:     Following the type of diet specifically designed for you will help prevent another stroke.  Your goal Body Mass Index (BMI) is 19-24.  Healthy food habits can help reduce 3 risk factors for stroke:  High cholesterol, hypertension, and excess weight.  RESOURCES Stroke/Support Group:  Call [phone]   STROKE EDUCATION PROVIDED/REVIEWED AND GIVEN TO PATIENT Stroke warning signs and symptoms How to activate emergency medical system (call 911). Medications prescribed at discharge. Need for follow-up after discharge. Personal risk factors for stroke. Pneumonia vaccine given: No Flu vaccine given: No My questions have been answered, the writing is legible, and I understand these instructions.  I will adhere to these  goals & educational materials that have been provided to me after my discharge from the hospital.

## 2014-11-23 NOTE — Progress Notes (Signed)
Spoke with Martha SalvoBridgette Allison, NP about patient's heart rate in the upper 140's. Will let Dr. Jacinto HalimGanji know or come see patient herself.

## 2014-11-23 NOTE — Care Management Note (Signed)
Case Management Note  Patient Details  Name: Martha Clark MRN: [id number] Date of Birth: 06/13/1955  Subjective/Objective:                    Action/Plan:  Met with patient to discuss discharge planning. Patient is agreeable to outpatient therapy and has chosen [facility].  Written information was provided to patient and appointment information was added to the AVS.  Clinicals were faxed. Expected Discharge Date:   (pending)               Expected Discharge Plan:  Home/Self Care  In-House Referral:     Discharge planning Services  CM Consult  Post Acute Care Choice:    Choice offered to:  Patient  DME Arranged:    DME Agency:     HH Arranged:    HH Agency:     Status of Service:  Completed, signed off  Medicare Important Message Given:    Date Medicare IM Given:    Medicare IM give by:    Date Additional Medicare IM Given:    Additional Medicare Important Message give by:     If discussed at Long Length of Stay Meetings, dates discussed:    Additional Comments:  ,  C, RN 11/23/2014, 5:21 PM  

## 2014-11-23 NOTE — Progress Notes (Signed)
Physical Therapy Treatment Patient Details Name: Martha Clark AreasJoyce P Rottinghaus MRN: [id number] DOB: 03/06/1956 Today's Date: 11/23/2014    History of Present Illness pt presents with R Basal Ganglia Infarct post revascularization.  pt with hx of MVR and A-fib.      PT Comments    Pt progressing well towards all goals. Pt remains to have altered sensation and weakness in L UE and LE. Pt to benefit from out pt PT to address L sided weakness and higher level balance impairments as pt was working and completely indep PTA.   Follow Up Recommendations  Outpatient PT;Supervision - Intermittent     Equipment Recommendations  None recommended by PT    Recommendations for Other Services       Precautions / Restrictions Precautions Precautions: None Restrictions Weight Bearing Restrictions: No    Mobility  Bed Mobility Overal bed mobility: Modified Independent Bed Mobility: Supine to Sit     Supine to sit: Modified independent (Device/Increase time)     General bed mobility comments: pt used L UE appropriately and demo'd no difficulty  Transfers Overall transfer level: Needs assistance Equipment used: None Transfers: Sit to/from Stand Sit to Stand: Supervision         General transfer comment: pt cautious/guarded but no signs of instability  Ambulation/Gait Ambulation/Gait assistance: Supervision Ambulation Distance (Feet): 800 Feet Assistive device: None Gait Pattern/deviations: Step-through pattern;Decreased stance time - left;Decreased weight shift to left (decreased L step height) Gait velocity: slightly below normal but approriate   General Gait Details: pt with L LE "scuffing" but able to correct with v/c's. Pt with scuffing with onset of fatigue as well. no episodes of LOB   Stairs Stairs: Yes Stairs assistance: Min guard Stair Management: One rail Right Number of Stairs: 12 General stair comments: v/c's for sequencing, pt used R handrail with both hands but able to  do reciprocally  Wheelchair Mobility    Modified Rankin (Stroke Patients Only) Modified Rankin (Stroke Patients Only) Pre-Morbid Rankin Score: No symptoms Modified Rankin: Moderate disability     Balance Overall balance assessment: Needs assistance                           High level balance activites: Backward walking (tandem walking) High Level Balance Comments: pt with difficulty with tandem ambulation requiring minA. pt min guard with backwards walking. Difficulty standing on one leg as well, unable to achieve 3 seconds with either LE.    Cognition Arousal/Alertness: Awake/alert Behavior During Therapy: WFL for tasks assessed/performed Overall Cognitive Status: Within Functional Limits for tasks assessed                      Exercises      General Comments General comments (skin integrity, edema, etc.): assisted pt to the bathroom, pt supervision for hygiene and transfers      Pertinent Vitals/Pain Pain Assessment: No/denies pain    Home Living                      Prior Function            PT Goals (current goals can now be found in the care plan section) Acute Rehab PT Goals Patient Stated Goal: back to normal Progress towards PT goals: Progressing toward goals    Frequency  Min 4X/week    PT Plan Discharge plan needs to be updated    Co-evaluation  End of Session Equipment Utilized During Treatment: Gait belt Activity Tolerance: Patient tolerated treatment well Patient left: in chair;with call bell/phone within reach;with family/visitor present     Time: 0836-0920 PT Time Calculation (min) (ACUTE ONLY): 44 min  Charges:  $Gait Training: 23-37 mins $Therapeutic Activity: 8-22 mins                    G Codes:      Marcene Brawn 11/23/2014, 9:41 AM   Lewis Shock, PT, DPT Pager #: [phone] Office #: [phone]

## 2014-11-23 NOTE — Progress Notes (Signed)
STROKE TEAM PROGRESS NOTE   HISTORY Martha Clark is a 59 y.o. female with a past medical history significant for MVP s/p bovine mitral valve replacement, newly diagnosed atrial fibrillation s/p cardioversion in he ER on 6/21, osteoporosis, and asthma, brought in via EMS due to acute onset of left hemiparesis, dysarthria, and left facial weakness. Patient was home with family when she fell and called out for her family in the other room and noted patient to have left side paralysis with facial droop and slurred speech. EMS was immediately summoned and patient brought to the ED. Awake and alert at initial evaluation, with NIHSS 15 and CT scan that was personally reviewed and showed no acute abnormality. She denies HA, vertigo, double vision, difficulty swallowing, or visual disturbances.No recent bleeding or surgeries. Patient is not taking anticoagulants. NIHSS: 15 She was last known well 11/21/13 at 1655. She was administered IV tPA (50 mg on 11/20/2014 at 1800) followed by neurointervention with TICI 3 revascularization. She was admitted to neuro ICU post procedure for further evaluation and treatement.   SUBJECTIVE (INTERVAL HISTORY) Her son and daughter were at bedside. Patient up in chair. Has had tachycardia frequently this am. Feeling good. Ready for OP therapy.   OBJECTIVE Temp:  [98 F (36.7 C)-98.8 F (37.1 C)] 98.2 F (36.8 C) (06/28 0900) Pulse Rate:  [64-145] 145 (06/28 0900) Cardiac Rhythm:  [-] Atrial flutter (06/28 0800) Resp:  [16-22] 18 (06/28 0900) BP: (103-125)/(62-76) 106/67 mmHg (06/28 0900) SpO2:  [98 %-100 %] 99 % (06/28 0900)   Recent Labs Lab 11/21/14 1927 11/21/14 2351 11/22/14 0341 11/22/14 0806 11/22/14 1207  GLUCAP 90 95 84 107* 98    Recent Labs Lab 11/20/14 1738 11/20/14 1748 11/21/14 0615 11/22/14 0226 11/23/14 0615  NA 142 142 137 141 138  K 3.8 3.8 3.4* 3.6 4.2  CL 107 106 108 110 107  CO2 23  --  24 25 28   GLUCOSE 118* 117* 144* 101*  102*  BUN 22* 24* 9 <5* <5*  CREATININE 0.76 0.80 0.62 0.59 0.56  CALCIUM 8.9  --  8.1* 8.3* 8.0*  MG  --   --   --  1.6* 1.8  PHOS  --   --   --  3.6 2.7    Recent Labs Lab 11/20/14 1738  AST 34  ALT 43  ALKPHOS 72  BILITOT 0.5  PROT 5.9*  ALBUMIN 3.4*    Recent Labs Lab 11/20/14 1738 11/20/14 1748 11/21/14 0615 11/22/14 0226 11/23/14 0615  WBC 5.3  --  7.8 7.5 6.8  NEUTROABS 2.7  --  5.9  --   --   HGB 13.0 13.6 11.8* 12.9 13.0  HCT 39.5 40.0 34.9* 39.2 39.5  MCV 86.1  --  84.5 85.8 88.0  PLT 173  --  160 173 178   No results for input(s): CKTOTAL, CKMB, CKMBINDEX, TROPONINI in the last 168 hours.  Recent Labs  11/20/14 1738  LABPROT 14.3  INR 1.09    Recent Labs  11/20/14 1835  COLORURINE YELLOW  LABSPEC 1.027  PHURINE 7.0  GLUCOSEU NEGATIVE  HGBUR SMALL*  BILIRUBINUR NEGATIVE  KETONESUR 15*  PROTEINUR NEGATIVE  UROBILINOGEN 1.0  NITRITE NEGATIVE  LEUKOCYTESUR NEGATIVE       Component Value Date/Time   CHOL 127 11/21/2014 0615   TRIG 65 11/21/2014 0615   HDL 38* 11/21/2014 0615   CHOLHDL 3.3 11/21/2014 0615   VLDL 13 11/21/2014 0615   LDLCALC 76 11/21/2014 0615  Lab Results  Component Value Date   HGBA1C 5.3 11/21/2014   No results found for: LABOPIA, COCAINSCRNUR, LABBENZ, AMPHETMU, THCU, LABBARB  No results for input(s): ETH in the last 168 hours.   Ct Head Wo Contrast 11/20/2014    1. Subtle edema in the right basal ganglia consistent with acute infarction.  2. No intracranial hemorrhage.    11/20/2014    No evidence of acute intracranial abnormality.  Mild small vessel ischemic changes.    Cerebral Angiogram complete revascularization of occluded RT MCA with 5mg  of superselective TPA and x1 pass with the [place] FR 4mm x 40 mm retrieval device With TICI 3 revascularization  Mr Brain Wo Contrast 11/21/2014   Acute infarction affects much of the RIGHT basal ganglia, including the caudate and lentiform nucleus. No cortical  infarction is evident.  No hemorrhagic transformation within the infarct, but acute subarachnoid blood is suspected in the prepontine and suprasellar cistern. 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  Chronic and acute RIGHT sphenoid sinusitis.  RIGHT ICA and RIGHT MCA remain patent based on flow void appearance.   Dg Chest Port 1 View 11/20/2014    No evidence of acute cardiopulmonary disease.  Endotracheal tube terminates at the thoracic inlet, 4 cm above the carina.     2D Echocardiogram  EF 55-60% with no source of embolus.    Physical exam: Exam: Gen: pleasant young caucasian lady Eyes: anicteric sclerae, moist conjunctivae                    CV: no MRG, no carotid bruits, no peripheral edema Mental Status: Alert, follows commands    Neurologic Exam Awake alert oriented 3 with normal speech and language function. No aphasia, apraxia or dysarthria. Follows commands well.      Cranial Nerves:    The pupils are equal, round, and reactive to light. Fundi flat.  EOMI. No gaze preference. Visual fields full. Face appears symmetric but difficult to assess due to tubing, Tongue appears midline but again difficult to assess due to tubing. Hearing intact to voice. Shoulder shrug intact.   Motor Observation:    no involuntary movements noted. Tone appears normal.     Strength:   No drift. Diminished fine finger movements on the left. Mild weakness of left grip, intrinsic hand muscles and left hip flexors and ankle dorsiflexors only.     Sensation:  Intact to LT and pin prick x 4  Plantars downgoing.   DTRs: normal and symmetric  Coordination: normal   ASSESSMENT/PLAN Martha Clark is a 59 y.o. female with history of  mitral valve prolapse, status post bovine mitral valve replacement, newly diagnosed atrial fibrillation, status post cardioversion in the emergency department  on 11/16/2014, osteoporosis, and asthma presenting with left hemiparesis, left facial weakness, and dysarthria. She did receive IV t-PA  50 mg on 11/20/2014 at 1800 followed by neuro intervention.  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 acute subarachnoid blood is suspected in the prepontine and suprasellar cistern. 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 Pearlean BrownieSEthi and radiology, unsure what abnormality is, but NOT thought to be SAH.  CT angio after d/w Dr Ermelinda Das which showed no aneurysm/AVM and no SAH making MRI finding questionable hence will continue anticoagulation with Eliquis)  2D Echo  No source of embolus. tissue valve in the MV position.  LDL 76  HgbA1c 5.3  SCDs for VTE prophylaxis Diet vegetarian Room service appropriate?: Yes; Fluid consistency:: Thin  aspirin 81 mg orally every day prior to admission, now on eliquis (apixaban) 5 mg bid.   Ongoing aggressive stroke risk factor management  Therapy recommendations:  OP PT and ST (OT pending)  Disposition: d/c home once cleared by cardiology  Atrial Fibrillation with RVR  Hx MVR with bioprosthetic bovine valve 2008  Home anticoagulation:  None  CHA2DS2-VASc Score = 3, ?2 oral anticoagulation recommended  Age in Years:  <65   0    Sex:  Female  Female   +1    Hypertension History:  no    Diabetes Mellitus:  0   Congestive Heart Failure History:  0  Vascular Disease History:  0     Stroke/TIA/Thromboembolism History:  yes   +2  On eliquis 5 mg bid, started now.  Dr. Jacinto Halim addressing ongoing tachycardia - will continue to monitor today  Hypotension  Home meds:  Metoprolol  Blood pressures continue  to run low   Resumed metoprolol today. monitor   Hyperlipidem cdia  Home meds: No lipid lowering medications prior to admission  LDL 76, goal < 70  add low-dose Lipitor 10 mg daily  Continue statin at discharge  Other Stroke Risk Factors  Family hx stroke (Father)  Other Active Problems  Mild anemia, resolved  Mild hypokalemia, resolved  Hypokalemia, resolved  Mildly elevated glucose  Hospital day # 3  BIBY,SHARON  [facility] See Amion for Pager information 11/23/2014 1:23 PM  I have personally examined this patient, reviewed notes, independently viewed imaging studies, participated in medical decision making and plan of care. I have made any additions or clarifications directly to the above note. Agree with note above. Ready for discharge after cardiology clearance.D/W patient, son and daughter and answered questions.  Delia Heady, MD Medical Director [facility] Pager: [phone] 11/23/2014 8:47 PM  To contact Stroke Continuity provider, please refer to [email or web address]. After hours, contact General Neurology

## 2014-11-23 NOTE — Evaluation (Signed)
Occupational Therapy Evaluation Patient Details Name: Martha Clark MRN: [id number] DOB: September 19, 1955 Today's Date: 11/23/2014    History of Present Illness pt presents with R Basal Ganglia Infarct post revascularization.  pt with hx of MVR and A-fib.     Clinical Impression   Patient evaluated by Occupational Therapy with no further acute OT needs identified. All education has been completed and the patient has no further questions. See below for any follow-up Occupational Therapy or equipment needs. OT to sign off. Thank you for referral.      Follow Up Recommendations  Outpatient OT    Equipment Recommendations       Recommendations for Other Services       Precautions / Restrictions Precautions Precautions: None      Mobility Bed Mobility               General bed mobility comments: in bathroom on arrival  Transfers Overall transfer level: Needs assistance Equipment used: None Transfers: Sit to/from Stand Sit to Stand: Supervision              Balance                                            ADL Overall ADL's : Needs assistance/impaired                                       General ADL Comments: Pt complete chair to toilet transfer . Pt static sitting for hand exercises and HR 147  Pt near baseline but with balance deficits noted .     Vision     Perception     Praxis      Pertinent Vitals/Pain Pain Assessment: No/denies pain     Hand Dominance Right   Extremity/Trunk Assessment Upper Extremity Assessment Upper Extremity Assessment: LUE deficits/detail LUE Deficits / Details: decr grasp LUE Coordination: decreased fine motor;decreased gross motor   Lower Extremity Assessment Lower Extremity Assessment: Defer to PT evaluation   Cervical / Trunk Assessment Cervical / Trunk Assessment: Normal   Communication Communication Communication: No difficulties   Cognition Arousal/Alertness:  Awake/alert Behavior During Therapy: WFL for tasks assessed/performed Overall Cognitive Status: Within Functional Limits for tasks assessed                     General Comments       Exercises Exercises: Other exercises Other Exercises Other Exercises: theraputty red wtih handout and thera ball with handout excellent return demo   Shoulder Instructions      Home Living Family/patient expects to be discharged to:: Private residence Living Arrangements: Spouse/significant other Available Help at Discharge: Family;Available 24 hours/day Type of Home: House Home Access: Stairs to enter [employer] of Steps: 5 Entrance Stairs-Rails: Right Home Layout: Two level;Able to live on main level with bedroom/bathroom Alternate Level Stairs-Number of Steps: flight             Home Equipment: None   Additional Comments: pt and spouse to close on new home tomorrow, but unclear if this is info for current  home or new one.        Prior Functioning/Environment Level of Independence: Independent             OT Diagnosis: Generalized weakness  OT Problem List:     OT Treatment/Interventions:      OT Goals(Current goals can be found in the care plan section) Acute Rehab OT Goals Patient Stated Goal: back to normal OT Goal Formulation: With patient Potential to Achieve Goals: Good  OT Frequency:     Barriers to D/C:            Co-evaluation              End of Session Nurse Communication: Mobility status;Precautions  Activity Tolerance: Other (comment) (HR Incr) Patient left: in chair;with call bell/phone within reach;with family/visitor present;with nursing/sitter in room   Time: 1319-1346 OT Time Calculation (min): 27 min Charges:  OT General Charges $OT Visit: 1 Procedure OT Evaluation $Initial OT Evaluation Tier I: 1 Procedure OT Treatments $Therapeutic Exercise: 8-22 mins G-Codes:    Harolyn RutherfordJones, [place] B 11/23/2014, 2:53 PM  Pager:  [phone]

## 2014-11-23 NOTE — Progress Notes (Signed)
Subjective:  Feels much better with regards to the left arm strength. Ambulated in hallway without much difficulty. C/O palpitations  Objective:  Vital Signs in the last 24 hours: Temp:  [98 F (36.7 C)-98.8 F (37.1 C)] 98.2 F (36.8 C) (06/28 0900) Pulse Rate:  [64-145] 145 (06/28 0900) Resp:  [16-22] 18 (06/28 0900) BP: (103-125)/(62-76) 106/67 mmHg (06/28 0900) SpO2:  [94 %-100 %] 99 % (06/28 0900)  Intake/Output from previous day: 06/27 0701 - 06/28 0700 In: 297.5 [P.O.:120; I.V.:177.5] Out: 750 [Urine:750]  Physical Exam: General appearance: alert, cooperative, appears stated age and no distress Eyes: negative Neck: no adenopathy, no carotid bruit, no JVD, supple, symmetrical, trachea midline and thyroid not enlarged, symmetric, no tenderness/mass/nodules Resp: clear to auscultation bilaterally Chest wall: no tenderness Cardio: Tachycardia present. No murmur or gallop appreciated GI: soft, non-tender; bowel sounds normal; no masses,  no organomegaly Extremities: extremities normal, atraumatic, no cyanosis or edema. Left arm mildly weaker.  Lab Results: BMP  Recent Labs  11/21/14 0615 11/22/14 0226 11/23/14 0615  NA 137 141 138  K 3.4* 3.6 4.2  CL 108 110 107  CO2 GLUCOSE 144* 101* 102*  BUN 9 <5* <5*  CREATININE 0.62 0.59 0.56  CALCIUM 8.1* 8.3* 8.0*  GFRNONAA >60 >60 >60  GFRAA >60 >60 >60    CBC  Recent Labs Lab 11/21/14 0615  11/23/14 0615  WBC 7.8  < > 6.8  RBC 4.13  < > 4.49  HGB 11.8*  < > 13.0  HCT 34.9*  < > 39.5  PLT 160  < > 178  MCV 84.5  < > 88.0  MCH 28.6  < > 29.0  MCHC 33.8  < > 32.9  RDW 12.4  < > 12.7  LYMPHSABS 1.3  --   --   MONOABS 0.5  --   --   EOSABS 0.1  --   --   BASOSABS 0.0  --   --   < > = values in this interval not displayed.  HEMOGLOBIN A1C Lab Results  Component Value Date   HGBA1C 5.3 11/21/2014   MPG 105 11/21/2014    CHOLESTEROL  Recent Labs  11/21/14 0615  CHOL 127    Hepatic  Function Panel  Recent Labs  11/20/14 1738  PROT 5.9*  ALBUMIN 3.4*  AST 34  ALT 43  ALKPHOS 72  BILITOT 0.5   Lipid Panel     Component Value Date/Time   CHOL 127 11/21/2014 0615   TRIG 65 11/21/2014 0615   HDL 38* 11/21/2014 0615   CHOLHDL 3.3 11/21/2014 0615   VLDL 13 11/21/2014 0615   LDLCALC 76 11/21/2014 0615   Ct Angio Head W/cm &/or Wo Cm  IMPRESSION: 1. No significant subarachnoid hemorrhage is evident. Previously-seen hemorrhage may have resolved or be artifact. 2. No large vessel occlusion.  The right MCA remains patent.  At 3. The branch vessels appear to be within normal limits. 4. The right caudate head and lentiform nucleus infarcts with slight increase in edema and mass effect. These results were called by telephone at the time of interpretation on 11/22/2014 at 4:21 pm to Dr. Delia Heady , who verbally acknowledged these results.   Electronically Signed   By: Marin Roberts M.D.   On: 11/22/2014 16:21   Cardiac Studies:  Echocardiogram 11/21/2014: Left ventricle: The cavity size was normal. Wall thickness was increased in a pattern of mild LVH. Systolic function was normal. The estimated ejection fraction  was in the range of 55% to 60%. Wall motion was normal; there were no regional wall motion abnormalities. Aortic valve: Valve area (VTI): 1.92 cm^2. Valve area (Vmax): 1.98 cm^2. Mitral valve: There is a tissue valve in the MV position, unknowntype. Severely calcified annulus. Normal thickness leaflets. There was mild regurgitation. Left atrium: The atrium was moderately dilated. Atrial septum: No defect or patent foramen ovale was identified.  EKG 11/22/2014 (continuous monitor): atrial flutter with variable 2:1 and 3:1 conduction, rate controlled at 80-90.  Assessment/Plan:  1. A. flutter: presently in A. Flutter with variable 2:1 and 3:1 conduction with rapid ventricular response. V-Rate 140-160/min.   CHA2DS2-VASCScore: Risk Score  3,  Yearly risk of  stroke  3.2%. Recommendation: ASA: no/Anticoagulation: yes; Eliquis 5mg  BID.   OAC Has Bled: Score 1.  Estimated risk of major bleeding at 1 year with OAC 1.02-1.5%. 2. Acute CVA: no deficits post tPA 3. H/O mitral valve replacement with bioprosthetic bovine valve in 2008  Recommendation: Add metoprolol to the present regimen. No anti arrhythmic therapy due to acute stroke and risk of residual thrombus in the LA/LAA. Keep in house today, probably discharge tomorrow.    Yates DecampGANJI, Naia Ruff, MD, [facility]  11/23/2014, 12:37 PM [facility] Pager: [phone] Office: [phone]

## 2014-11-24 MED ORDER — DILTIAZEM HCL ER COATED BEADS 180 MG PO CP24: 180.0000 mg | ORAL_CAPSULE | Freq: Every day | ORAL | Status: DC

## 2014-11-24 MED ORDER — METOPROLOL TARTRATE 25 MG PO TABS
25.0000 mg | ORAL_TABLET | Freq: Two times a day (BID) | ORAL | Status: DC
  Administered 2014-11-24: 25 mg via ORAL
  Filled 2014-11-24: qty 1

## 2014-11-24 MED ORDER — ATORVASTATIN CALCIUM 10 MG PO TABS: 10.0000 mg | ORAL_TABLET | Freq: Every day | ORAL | Status: DC

## 2014-11-24 MED ORDER — METOPROLOL TARTRATE 25 MG PO TABS: 25.0000 mg | ORAL_TABLET | Freq: Two times a day (BID) | ORAL | Status: DC

## 2014-11-24 MED ORDER — APIXABAN 5 MG PO TABS: 5.0000 mg | ORAL_TABLET | Freq: Two times a day (BID) | ORAL | Status: AC

## 2014-11-24 NOTE — Progress Notes (Signed)
Subjective:  Feels much better with regards to the left arm strength, left leg strength. Does not feel any palpitations. Heart rate much better controlled.  Objective:  Vital Signs in the last 24 hours: Temp:  [98 F (36.7 C)-98.4 F (36.9 C)] 98.1 F (36.7 C) (06/29 0653) Pulse Rate:  [69-140] 72 (06/29 0653) Resp:  [16-18] 18 (06/29 0653) BP: (102-110)/(63-67) 107/66 mmHg (06/29 0653) SpO2:  [96 %-100 %] 97 % (06/29 0653)  Intake/Output from previous day: 06/28 0701 - 06/29 0700 In: 960 [P.O.:960] Out: -   Physical Exam: General appearance: alert, cooperative, appears stated age and no distress Eyes: negative Neck: no adenopathy, no carotid bruit, no JVD, supple, symmetrical, trachea midline and thyroid not enlarged, symmetric, no tenderness/mass/nodules Resp: clear to auscultation bilaterally Chest wall: no tenderness Cardio: irregularly irregular rhythm and S1 is variable, S2 is normal. No gallop or murmur appreciated. GI: soft, non-tender; bowel sounds normal; no masses,  no organomegaly Extremities: extremities normal, atraumatic, no cyanosis or edema. Left arm mildly weaker.  Lab Results: BMP  Recent Labs  11/21/14 0615 11/22/14 0226 11/23/14 0615  NA 137 141 138  K 3.4* 3.6 4.2  CL 108 110 107  CO2 24 25 28   GLUCOSE 144* 101* 102*  BUN 9 <5* <5*  CREATININE 0.62 0.59 0.56  CALCIUM 8.1* 8.3* 8.0*  GFRNONAA >60 >60 >60  GFRAA >60 >60 >60    CBC  Recent Labs Lab 11/21/14 0615  11/23/14 0615  WBC 7.8  < > 6.8  RBC 4.13  < > 4.49  HGB 11.8*  < > 13.0  HCT 34.9*  < > 39.5  PLT 160  < > 178  MCV 84.5  < > 88.0  MCH 28.6  < > 29.0  MCHC 33.8  < > 32.9  RDW 12.4  < > 12.7  LYMPHSABS 1.3  --   --   MONOABS 0.5  --   --   EOSABS 0.1  --   --   BASOSABS 0.0  --   --   < > = values in this interval not displayed.  HEMOGLOBIN A1C Lab Results  Component Value Date   HGBA1C 5.3 11/21/2014   MPG 105 11/21/2014    CHOLESTEROL  Recent Labs  11/21/14 0615  CHOL 127    Hepatic Function Panel  Recent Labs  11/20/14 1738  PROT 5.9*  ALBUMIN 3.4*  AST 34  ALT 43  ALKPHOS 72  BILITOT 0.5   Lipid Panel     Component Value Date/Time   CHOL 127 11/21/2014 0615   TRIG 65 11/21/2014 0615   HDL 38* 11/21/2014 0615   CHOLHDL 3.3 11/21/2014 0615   VLDL 13 11/21/2014 0615   LDLCALC 76 11/21/2014 0615   Ct Angio Head W/cm &/or Wo Cm  IMPRESSION: 1. No significant subarachnoid hemorrhage is evident. Previously-seen hemorrhage may have resolved or be artifact. 2. No large vessel occlusion.  The right MCA remains patent.  At 3. The branch vessels appear to be within normal limits. 4. The right caudate head and lentiform nucleus infarcts with slight increase in edema and mass effect. These results were called by telephone at the time of interpretation on 11/22/2014 at 4:21 pm to Dr. Delia HeadyPRAMOD SETHI , who verbally acknowledged these results.   Electronically Signed   By: Marin Robertshristopher  Mattern M.D.   On: 11/22/2014 16:21   Cardiac Studies:  Echocardiogram 11/21/2014: Left ventricle: The cavity size was normal. Wall thickness was increased in a pattern  of mild LVH. Systolic function was normal. The estimated ejection fraction was in the range of 55% to 60%. Wall motion was normal; there were no regional wall motion abnormalities. Aortic valve: Valve area (VTI): 1.92 cm^2. Valve area (Vmax): 1.98 cm^2. Mitral valve: There is a tissue valve in the MV position, unknowntype. Severely calcified annulus. Normal thickness leaflets. There was mild regurgitation. Left atrium: The atrium was moderately dilated. Atrial septum: No defect or patent foramen ovale was identified.  EKG 11/22/2014 (continuous monitor): atrial flutter with variable 2:1 and 3:1 conduction, rate controlled at 80-90.  Assessment/Plan:  1. A. flutter: presently in A. Flutter with variable 2:1 and 3:1 conduction with Controlled entricular response.  CHA2DS2-VASCScore: Risk Score   3,  Yearly risk of stroke  3.2%. Recommendation: ASA: no/Anticoagulation: yes; Eliquis  BID.   OAC Has Bled: Score 1.  Estimated risk of major bleeding at 1 year with OAC 1.02-1.5%. 2. Acute CVA: no deficits post tPA 3. H/O mitral valve replacement with bioprosthetic bovine valve in 2008  Recommendation:  change metoprolol to 25 g by mouth twice a day. She can be discharged home on the present dose of the medications, I will see her back in the office in 10 days to 2 weeks. Patient is aware to call me if she feels palpitations, I will add digoxin if her heart rate is not controlled. Eventually my plan is to perform EP evaluation for possible atrial flutter ablation.  Please note patient has not had any atrial fibrillation by documentation. Patient is in persistent atrial flutter only.     Yates Decamp, MD, [facility]  11/24/2014, 9:11 AM [facility] Pager: [phone] Office: [phone]

## 2014-11-24 NOTE — Progress Notes (Signed)
Physical Therapy Treatment Patient Details Name: Martha Clark MRN: [id number] DOB: 30-Sep-1955 Today's Date: 11/24/2014    History of Present Illness pt presents with R Basal Ganglia Infarct post revascularization.  pt with hx of MVR and A-fib.      PT Comments    Patient is progressing well towards physical therapy goals, ambulating up to 600 feet with supervision, tolerating dynamic gait challenges without an assistive device demonstrating minor instability at times. Safely completed stair training. She has no further concerns regarding her mobility at this time. Feel pt is adequate for d/c from a mobility standpoint when medically ready.    Follow Up Recommendations  Outpatient PT;Supervision - Intermittent     Equipment Recommendations  None recommended by PT    Recommendations for Other Services       Precautions / Restrictions Precautions Precautions: None Restrictions Weight Bearing Restrictions: No    Mobility  Bed Mobility Overal bed mobility: Modified Independent             General bed mobility comments: Extra time  Transfers Overall transfer level: Needs assistance Equipment used: None Transfers: Sit to/from Stand Sit to Stand: Supervision         General transfer comment: pt cautious but no signs of instability Did not require assist. supervision for safety.  Ambulation/Gait Ambulation/Gait assistance: Supervision Ambulation Distance (Feet): 600 Feet Assistive device: None Gait Pattern/deviations: Step-through pattern;Decreased stance time - left;Decreased weight shift to left;Scissoring (decreased left step height)     General Gait Details: Continues to demonstrate intermittent scuffing of LLE. VC for awareness and is able to self correct. Tolerated dynamic gait challenges including, high marching, backwards stepping, and vertical and horizontal head turns without loss of balance. Notable difficulty with heel-to-toe  stepping.   Stairs Stairs: Yes Stairs assistance: Supervision Stair Management: One rail Right Number of Stairs: 12 General stair comments: supervision for safety. VC for step-to pattern while ascending and able to use alternating step pattern safely with descent.  States she feels more confident with this task today. Only one hand on rail.  Wheelchair Mobility    Modified Rankin (Stroke Patients Only) Modified Rankin (Stroke Patients Only) Pre-Morbid Rankin Score: No symptoms Modified Rankin: Moderately severe disability     Balance                                    Cognition Arousal/Alertness: Awake/alert Behavior During Therapy: WFL for tasks assessed/performed Overall Cognitive Status: Within Functional Limits for tasks assessed                      Exercises      General Comments        Pertinent Vitals/Pain Pain Assessment: No/denies pain  HR 114    Home Living                      Prior Function            PT Goals (current goals can now be found in the care plan section) Acute Rehab PT Goals Patient Stated Goal: back to normal PT Goal Formulation: With patient Time For Goal Achievement: 11/29/14 Potential to Achieve Goals: Good Progress towards PT goals: Progressing toward goals    Frequency  Min 4X/week    PT Plan Current plan remains appropriate    Co-evaluation  End of Session Equipment Utilized During Treatment: Gait belt Activity Tolerance: Patient tolerated treatment well Patient left: in chair;with call bell/phone within reach;with family/visitor present     Time: 1001-1015 PT Time Calculation (min) (ACUTE ONLY): 14 min  Charges:  $Gait Training: 8-22 mins                    G Codes:      Berton MountBarbour, Christos Mixson S 11/24/2014, 11:10 AM Charlsie MerlesLogan Secor Nihal Doan, PT [phone]

## 2014-11-24 NOTE — Progress Notes (Signed)
RN discussed discharge instructions with patient. Patient vocalized understanding of medications, follow up instructions, MD order for no driving, and signs and symptoms of stroke. RN removed pressure dressing, no signs or symptoms of bleeding. [facility] consult placed and consent signed. Tele removed, IVs removed, neuro assessment unchanged. Patient to be escorted to car by staff.

## 2014-11-24 NOTE — Discharge Summary (Signed)
Stroke Discharge Summary  Patient ID: Martha Clark   MRN: [id number]      DOB: February 28, 1956  Date of Admission: 11/20/2014 Date of Discharge: 11/24/2014  Attending Physician:  Martha Riley, MD, Stroke MD  Consulting Physician(s):      Roxanne Gates) Corliss Skains, MD (Interventional Neuroradiologist), Koren Bound, MD (CCM), Yates Decamp, MD ( cardiology) Patient's PCP:  Londell Moh, MD  DISCHARGE DIAGNOSIS:  Principal Problem:   Stroke: Non-dominant R MCA embolic infarct secondary known to atrial fibrillation from tissue valve, s/p IV tPA and revascularization to TICI3 with IA tPA and Solitaire device\ Active Problems:   Resultant Mild left arm drift with mild left arm dysmetria, improving   Atrial flutter   Tachycardia   Hypotension, improved   Hyperlipidemia   Mild anemia, resolved   Hypokalemia, resolved   Mildly elevated glucose   Coarctation of aorta, recurrent, post-intervention  Past Medical History  Diagnosis Date  . Atrial fibrillation   . Asthma   . Osteoporosis   . Mitral valve prolapse    Past Surgical History  Procedure Laterality Date  . Tubal ligation    . Mitral valve replacement  2008  . Radiology with anesthesia N/A 11/20/2014    Procedure: RADIOLOGY WITH ANESTHESIA;  Surgeon: Julieanne Cotton, MD;  Location: [facility] OR;  Service: Radiology;  Laterality: N/A;      Medication List    STOP taking these medications        aspirin EC 81 MG tablet     flecainide 50 MG tablet  Commonly known as:  TAMBOCOR     metoprolol succinate 25 MG 24 hr tablet  Commonly known as:  TOPROL XL      TAKE these medications        acetaminophen 325 MG tablet  Commonly known as:  TYLENOL  Take 1,300 mg by mouth every 6 (six) hours as needed for mild pain.     apixaban 5 MG Tabs tablet  Commonly known as:  ELIQUIS  Take 1 tablet (5 mg total) by mouth 2 (two) times daily.     atorvastatin 10 MG tablet  Commonly known as:  LIPITOR  Take 1 tablet (10 mg  total) by mouth daily at 6 PM.     buPROPion 150 MG 24 hr tablet  Commonly known as:  WELLBUTRIN XL  Take 150 mg by mouth Daily.     calcium citrate-vitamin D 315-200 MG-UNIT per tablet  Commonly known as:  CITRACAL+D  Take 1 tablet by mouth daily.     CoQ-10 100 MG Caps  Take 1 tablet by mouth daily.     diltiazem 180 MG 24 hr capsule  Commonly known as:  CARDIZEM CD  Take 1 capsule (180 mg total) by mouth daily.     Fish Oil Oil  Take 15 mLs by mouth daily.     MAGNESIUM CHLORIDE PO  Take 1 tablet by mouth daily.     metoprolol tartrate 25 MG tablet  Commonly known as:  LOPRESSOR  Take 1 tablet (25 mg total) by mouth 2 (two) times daily.     multivitamin with minerals Tabs tablet  Take 1 tablet by mouth daily.     vitamin C 500 MG tablet  Commonly known as:  ASCORBIC ACID  Take 500 mg by mouth daily.     Vitamin D 1000 UNITS capsule  Take 1,000 Units by mouth daily.        LABORATORY STUDIES CBC  Component Value Date/Time   WBC 6.8 11/23/2014 0615   RBC 4.49 11/23/2014 0615   HGB 13.0 11/23/2014 0615   HCT 39.5 11/23/2014 0615   PLT 178 11/23/2014 0615   MCV 88.0 11/23/2014 0615   MCH 29.0 11/23/2014 0615   MCHC 32.9 11/23/2014 0615   RDW 12.7 11/23/2014 0615   LYMPHSABS 1.3 11/21/2014 0615   MONOABS 0.5 11/21/2014 0615   EOSABS 0.1 11/21/2014 0615   BASOSABS 0.0 11/21/2014 0615   CMP    Component Value Date/Time   NA 138 11/23/2014 0615   K 4.2 11/23/2014 0615   CL 107 11/23/2014 0615   CO2 28 11/23/2014 0615   GLUCOSE 102* 11/23/2014 0615   BUN <5* 11/23/2014 0615   CREATININE 0.56 11/23/2014 0615   CALCIUM 8.0* 11/23/2014 0615   PROT 5.9* 11/20/2014 1738   ALBUMIN 3.4* 11/20/2014 1738   AST 34 11/20/2014 1738   ALT 43 11/20/2014 1738   ALKPHOS 72 11/20/2014 1738   BILITOT 0.5 11/20/2014 1738   GFRNONAA >60 11/23/2014 0615   GFRAA >60 11/23/2014 0615   COAGS Lab Results  Component Value Date   INR 1.09 11/20/2014   Lipid Panel     Component Value Date/Time   CHOL 127 11/21/2014 0615   TRIG 65 11/21/2014 0615   HDL 38* 11/21/2014 0615   CHOLHDL 3.3 11/21/2014 0615   VLDL 13 11/21/2014 0615   LDLCALC 76 11/21/2014 0615   HgbA1C  Lab Results  Component Value Date   HGBA1C 5.3 11/21/2014   Cardiac Panel (last 3 results) No results for input(s): CKTOTAL, CKMB, TROPONINI, RELINDX in the last 72 hours. Urinalysis    Component Value Date/Time   COLORURINE YELLOW 11/20/2014 1835   APPEARANCEUR CLEAR 11/20/2014 1835   LABSPEC 1.027 11/20/2014 1835   PHURINE 7.0 11/20/2014 1835   GLUCOSEU NEGATIVE 11/20/2014 1835   HGBUR SMALL* 11/20/2014 1835   BILIRUBINUR NEGATIVE 11/20/2014 1835   KETONESUR 15* 11/20/2014 1835   PROTEINUR NEGATIVE 11/20/2014 1835   UROBILINOGEN 1.0 11/20/2014 1835   NITRITE NEGATIVE 11/20/2014 1835   LEUKOCYTESUR NEGATIVE 11/20/2014 1835   Urine Drug Screen No results found for: LABOPIA, COCAINSCRNUR, LABBENZ, AMPHETMU, THCU, LABBARB  Alcohol Level No results found for: [facility]   SIGNIFICANT DIAGNOSTIC STUDIES Ct Head Wo Contrast 11/20/2014  1. Subtle edema in the right basal ganglia consistent with acute infarction.  2. No intracranial hemorrhage.  11/20/2014  No evidence of acute intracranial abnormality. Mild small vessel ischemic changes.   Cerebral Angiogram complete revascularization of occluded RT MCA with 5mg  of superselective TPA and x1 pass with the Soltaire FR 4mm x 40 mm retrieval device With TICI 3 revascularization  Mr Brain Wo Contrast 11/21/2014 Acute infarction affects much of the RIGHT basal ganglia, including the caudate and lentiform nucleus. No cortical infarction is evident. No hemorrhagic transformation within the infarct, but acute subarachnoid blood is suspected in the prepontine and suprasellar cistern. 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  Chronic and acute RIGHT sphenoid sinusitis. RIGHT ICA and RIGHT MCA remain patent based on flow void appearance.   Dg Chest Port 1 View 11/20/2014  No evidence of acute cardiopulmonary disease. Endotracheal tube terminates at the thoracic inlet, 4 cm above the carina.   2D Echocardiogram EF 55-60% with no source of embolus.      HISTORY OF PRESENT ILLNESS  ZENORA KARPEL is a 59 y.o. female with a past medical history significant for  MVP s/p bovine mitral valve replacement, newly diagnosed atrial fibrillation s/p cardioversion in he ER on 6/21, osteoporosis, and asthma, brought in via EMS due to acute onset of left hemiparesis, dysarthria, and left facial weakness. Patient was home with family when she fell and called out for her family in the other room and noted patient to have left side paralysis with facial droop and slurred speech. EMS was immediately summoned and patient brought to the ED. Awake and alert at initial evaluation, with NIHSS 15 and CT scan showed no acute abnormality. She denied HA, vertigo, double vision, difficulty swallowing, or visual disturbances. No recent bleeding or surgeries. Patient not taking anticoagulants. NIHSS: 15. She was last known well 11/21/13 at 1655. She was administered IV tPA (50 mg on 11/20/2014 at 1800) followed by neurointervention with IA tPA and Solitaire device leading to TICI 3 revascularization. She was admitted to neuro ICU post procedure for further evaluation and treatement.   HOSPITAL COURSE [name] is a 59 y.o. female with history of mitral valve prolapse, status post bovine mitral valve replacement, newly diagnosed atrial fibrillation, status post cardioversion in the emergency department on 11/16/2014, osteoporosis, and asthma presenting with left hemiparesis, left facial weakness, and dysarthria. She received IV t-PA 50 mg on 11/20/2014 at 1800 followed by neuro intervention with IA tPA and the Solitaire device.  Revascularization toTICI3. Good recovery.  Stroke: Non-dominant R MCA embolic infarct secondary known to atrial fibrillation, s/p IV tPA and revascularization to TICI3 with IA tPA and Solitaire device  Resultant Mild left arm drift with mild left arm dysmetria, improving  MRI R MCA - basal ganglia, including the caudate and lentiform nucleus. acute subarachnoid blood was suspected in the prepontine and suprasellar cistern.  (after Discussion between Dr. Pearlean BrownieSEthi and radiology, unsure what abnormality is, but NOT thought to be SAH. CT angio after showed no aneurysm/AVM and no SAH making MRI finding questionable hence will continue anticoagulation with Eliquis). 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 tissue valve in the MV position.  LDL 76  HgbA1c 5.3  aspirin 81 mg orally every day prior to admission, given atrial fibrillation changed to eliquis (apixaban) 5 mg bid.   Ongoing aggressive stroke risk factor management  Therapy recommendations: OP PT and ST and OT   Disposition: home with outpatient therapies  Atrial flutter  Hx MVR with bioprosthetic bovine valve 2008  Recent cardioversion in the emergency department 11/16/2014  Home anticoagulation: None.  On metoprrolol at home prior to admission  CHA2DS2-VASc Score = 3, ?2 oral anticoagulation recommended Age in Years: <65 0  Sex: Female Female +1  Hypertension History: no  Diabetes Mellitus: 0  Congestive Heart Failure History: 0 Vascular Disease History: 0  Stroke/TIA/Thromboembolism History: yes +2  Started on eliquis 5 mg bid  Dr. Jacinto HalimGanji addressed ongoing tachycardia - improvement on metoprolol 25 twice a day. Plan to add digoxin should continue as outpatient. Eventual EP procedure planned.   Flecanide held by  cardiology  Hypotension   Blood pressures low in hospital. Metoprolol held initially. Resumed for rate control  Hyperlipidemia  Home meds: No lipid lowering medications prior to admission  LDL 76, goal < 70  Added low-dose Lipitor 10 mg daily  Continue statin at discharge  Other Stroke Risk Factors  Family hx stroke (Father)  Other Active Problems  Mild anemia, resolved  Mild hypokalemia, resolved  Hypokalemia, resolved  Mildly elevated glucose   DISCHARGE EXAM Blood pressure 105/65,  pulse 102, temperature 98.6 F (37 C), temperature source Oral, resp. rate 20, height 5\' 9"  (1.753 m), weight 56 kg (123 lb 7.3 oz), SpO2 99 %. Exam: Gen: pleasant young caucasian lady Eyes: anicteric sclerae, moist conjunctivae  CV: no MRG, no carotid bruits, no peripheral edema Mental Status: Alert, follows commands Neurologic Exam Awake alert oriented 3 with normal speech and language function. No aphasia, apraxia or dysarthria. Follows commands well.  Cranial Nerves:  The pupils are equal, round, and reactive to light. Fundi flat. EOMI. No gaze preference. Visual fields full. Face appears symmetric but difficult to assess due to tubing, Tongue appears midline but again difficult to assess due to tubing. Hearing intact to voice. Shoulder shrug intact.  Motor Observation:  no involuntary movements noted. Tone appears normal.  Strength:  No drift. Diminished fine finger movements on the left. Mild weakness of left grip, intrinsic hand muscles and left hip flexors and ankle dorsiflexors only. Sensation:  Intact to LT and pin prick x 4 Plantars downgoing.  DTRs: normal and symmetric Coordination: normal   Discharge Diet   Diet vegetarian Room service appropriate?: Yes; Fluid consistency:: Thin liquids   DISCHARGE PLAN  Disposition:  HOME  Outpatient PT, OT and ST  eliquis (apixaban) for secondary stroke prevention.  Follow-up Londell Moh, MD in 2 weeks.  Follow up Dr. Jacinto Halim in 10-14 days, call for appt  Follow-up with Dr. Delia Heady, Stroke Clinic in 2 months.  40 minutes were spent preparing discharge.  Rhoderick Moody [facility] See Amion for Pager information 11/24/2014 3:33 PM  I have personally examined this patient, reviewed notes, independently viewed imaging studies, participated in medical decision making and plan of care. I have made any additions or clarifications directly to the above note. Agree with note above. Follow up in stroke clinic in 2 months.  Delia Heady, MD Medical Director [facility] Pager: [phone] 11/24/2014 3:38 PM

## 2014-11-24 NOTE — Progress Notes (Signed)
Patient discharged home with family, driven by sister. Patient escorted to car with nurse tech via wheelchair.

## 2014-11-30 ENCOUNTER — Other Ambulatory Visit (HOSPITAL_COMMUNITY): Payer: Self-pay | Admitting: Interventional Radiology

## 2014-11-30 DIAGNOSIS — I639 Cerebral infarction, unspecified: Secondary | ICD-10-CM

## 2014-12-06 ENCOUNTER — Other Ambulatory Visit: Payer: Self-pay

## 2014-12-06 NOTE — Patient Outreach (Signed)
[facility] Care Management  12/06/2014  Elson AreasJoyce P Pembroke 08/01/1955 [id number]   RED on EMMI Stroke Dashboard for 12/04/2014, assigned Wynonia Lawmanheryl Poteat, RN to outreach.  Corrie MckusickLisa O. Sharlee BlewMoore, [facility] Care Management [facility] CM Assistant Phone: [phone] Fax: [phone]

## 2014-12-06 NOTE — Patient Outreach (Signed)
[facility] Care Management  12/06/2014  Martha Clark 01/13/1956 [id number]   Subjective: This was an EMMI stroke transition referral received from [facility].  Called patient to address her response to EMMI question related to sad, hopeless, anxious, or empty.  Patient answered yes to this questions.  RN CM asked patient to talk a little bit more about why she had these feelings.  Patient stated her father had several strokes and she was feeling scared and anxious about the unknown.  RN CM allowed patient to express her feeling and acknowledged patients feelings.   Assessment:   RN CM reviewed with patient what to expect from enrolling in EMMI transition program.  Patient states she understands.  Patient collaborated with RN CM and agrees with care plan and goals of care.  Patient agrees to being called back and agrees to weekly follow up calls from RN CM.  Patient agrees to setting up next telephonic appointment.   Plan:  RN CM will call again next week to finish up assessments and follow up as discussed with care plan and goals.   Wynonia Lawmanheryl Amanuel Sinkfield, RN, Lowella DellMHA, [facility] Telephonic Care Coordinator [phone]

## 2014-12-08 ENCOUNTER — Ambulatory Visit: Payer: BLUE CROSS/BLUE SHIELD | Attending: Internal Medicine | Admitting: Occupational Therapy

## 2014-12-08 ENCOUNTER — Encounter: Payer: Self-pay | Admitting: Physical Therapy

## 2014-12-08 ENCOUNTER — Ambulatory Visit: Payer: BLUE CROSS/BLUE SHIELD

## 2014-12-08 ENCOUNTER — Ambulatory Visit: Payer: BLUE CROSS/BLUE SHIELD | Admitting: Physical Therapy

## 2014-12-08 DIAGNOSIS — R6889 Other general symptoms and signs: Secondary | ICD-10-CM | POA: Diagnosis present

## 2014-12-08 DIAGNOSIS — I6931 Cognitive deficits following cerebral infarction: Secondary | ICD-10-CM | POA: Diagnosis present

## 2014-12-08 DIAGNOSIS — R29818 Other symptoms and signs involving the nervous system: Secondary | ICD-10-CM | POA: Diagnosis present

## 2014-12-08 DIAGNOSIS — R531 Weakness: Secondary | ICD-10-CM | POA: Insufficient documentation

## 2014-12-08 DIAGNOSIS — I69898 Other sequelae of other cerebrovascular disease: Secondary | ICD-10-CM | POA: Insufficient documentation

## 2014-12-08 DIAGNOSIS — R41841 Cognitive communication deficit: Secondary | ICD-10-CM | POA: Insufficient documentation

## 2014-12-08 DIAGNOSIS — R269 Unspecified abnormalities of gait and mobility: Secondary | ICD-10-CM | POA: Diagnosis present

## 2014-12-08 DIAGNOSIS — IMO0002 Reserved for concepts with insufficient information to code with codable children: Secondary | ICD-10-CM

## 2014-12-08 DIAGNOSIS — I69319 Unspecified symptoms and signs involving cognitive functions following cerebral infarction: Secondary | ICD-10-CM

## 2014-12-08 DIAGNOSIS — R2689 Other abnormalities of gait and mobility: Secondary | ICD-10-CM

## 2014-12-08 NOTE — Therapy (Addendum)
[facility] [address] [place], [place], [address] Phone: [phone]   Fax:  [phone]  Occupational Therapy Evaluation  Patient Details  Name: Martha Clark MRN: [id number] Date of Birth: 08-30-1955 Referring Provider:  Merri Brunette, MD  Encounter Date: 12/08/2014    Past Medical History  Diagnosis Date  . Atrial fibrillation   . Asthma   . Osteoporosis   . Mitral valve prolapse     Past Surgical History  Procedure Laterality Date  . Tubal ligation    . Mitral valve replacement  2008  . Radiology with anesthesia N/A 11/20/2014    Procedure: RADIOLOGY WITH ANESTHESIA;  Surgeon: Julieanne Cotton, MD;  Location: [facility] OR;  Service: Radiology;  Laterality: N/A;    There were no vitals filed for this visit.  Visit Diagnosis:  Weakness due to cerebrovascular accident  Cognitive deficits following cerebral infarction  Decreased functional activity tolerance      Subjective Assessment - 12/08/14 1411    Subjective  Pt was admitted to [facility] on 11/16/14 with left sided weakness, dysarthria, and left facial weakness.   Pertinent History MVP s/p bovine mitral valve replacement, A-fib, (see EPIC snap shot for additional info)   Patient Stated Goals return to work   Currently in Pain? Yes   Pain Score 3    Pain Location Leg   Pain Orientation Left   Pain Type Acute pain   Pain Onset In the past 7 days   Pain Frequency Intermittent   Aggravating Factors  inactivity   Pain Relieving Factors movement   Multiple Pain Sites No           [facility] OT Assessment - 12/08/14 1413    Assessment   Diagnosis CVA   Onset Date 11/20/14   Assessment Pt was admitted 6/21/ 16 to hospital with left sided weakness.  Pt was diagnosed with R MCA CVA.   Prior Therapy in hospital only   Precautions   Precautions Fall   Balance Screen   Has the patient fallen in the past 6 months No   Has the patient had a decrease in  activity level because of a fear of falling?  No   Is the patient reluctant to leave their home because of a fear of falling?  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  Pt reports MD cleard her for driving   IADL   Shopping --  Pt reports has has been to store indpendently   Meal Prep Able to complete simple warm meal prep  Requires increased time, reports difficulty with new recipe   Medication Management Is responsible for taking medication in correct dosages at correct time   Financial Management --  Pt's husband handles   Mobility   Mobility Status Independent   Written Expression   Dominant Hand Right   Vision - History   Baseline Vision Bifocals   Vision Assessment   Ocular Range of Motion Within Functional Limits   Visual Fields No apparent deficits  gross assessment   Activity Tolerance   Activity Tolerance --  reports fatigue with cooking/ styling hair   Cognition   Area of Impairment Attention   Current Attention Level Selective   Attention Divided   Divided Attention Impaired   Divided Attention Impairment --  mod difficulty with physical/ cognitive task simultaneously   Memory Appears intact  3/3 words recalled    Executive Function --  to be further assess in a functional context   Observation/Other Assessments   Focus on Therapeutic Outcomes (FOTO)  did not capture   Coordination   9 Hole Peg Test Right;Left   Right 9 Hole Peg Test 22.60 ses   Left 9 Hole Peg Test 25.25 secs   ROM / Strength   AROM / PROM / Strength AROM;Strength   AROM   Overall AROM  Within functional limits for tasks performed   Strength   Overall Strength Deficits   Overall Strength Comments RUE 4+/5, LUE proximal 4-/5, distal 4/5   Hand Function   Right Hand Grip (lbs) 40 lbs   Left Hand Grip (lbs) 20 lbs                           OT Short Term Goals - 12/09/14 1957    OT SHORT TERM GOAL #1   Title I with HEP.   Baseline 01/07/15   Time 4   Period Weeks   Status New   OT SHORT TERM GOAL #2   Title Pt will increase LUE grip strength  to at least 25 lbs for increased functional use.   Time 4   Period Weeks   Status New   OT SHORT TERM GOAL #3   Title Pt will demonstrate ability to perform a physical and cognitive task simultaneously with 80% or greater acccuracy and no greater than 1 LOB.   Time 4   Period Weeks   Status New   OT SHORT TERM GOAL #4   Title Pt will demonstrate adequate LUE strength and endurance to retrieve 5 lbs weight from overhead shelf x 5 reps without drops.   Time 4   Period Weeks   Status New   OT SHORT TERM GOAL #5   Title Pt will demonstrate ability to organize and complete a mod complex cooking task, demonstrating good safety awareness, modified independently.   Time 4   Period Weeks   Status New   Additional Short Term Goals   Additional Short Term Goals Yes   OT SHORT TERM GOAL #6   Title further assess cognition and add additionl goal PRN.   Time 4   Period Weeks   Status New           OT Long Term Goals - 12/09/14 2005    OT LONG TERM GOAL #1   Title Pt will demonstrate LUE grip strength of 30 lbs or greater for increased functional use,    Baseline due 02/06/15   Time 8   Status New   OT LONG TERM GOAL #2   Title Pt will demonstrate ability to perform a physical and cognitive task simultaneously, with 95%  or better accuracy and  no LOB.   Time 8   Period Weeks   Status New   OT LONG TERM GOAL #3   Title Pt will report adequate strength and endurance to perform  home mnagement/ cooking activities in standing for 1 hour prior to rest break.   Time 8   Period Weeks   Status New   OT LONG TERM GOAL #4   Title Pt will demonstrate ability to perfom a moderate complex organization task with 95% or greater accuracy.   Time 8   Period Weeks   Status New               Plan - 12/08/14 1735    Clinical Impression Statement Pt s/p R MCA CVA on 11/16/14 presents with LUE weakness and cognitive deficits which impede performance of ADLS/ IADLS. Pt.can benefit from skilled OT to maximize independence with ADLS/ IADLS. 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   Pt will benefit from skilled therapeutic intervention in order to improve on the following deficits (Retired) Decreased endurance;Decreased activity tolerance;Impaired UE functional use;Decreased cognition;Decreased mobility;Decreased strength   Rehab Potential Good   OT Frequency 2x / week  plus eval   OT Duration 8 weeks   OT Treatment/Interventions Self-care/ADL training;Fluidtherapy;Moist Heat;DME and/or AE instruction;Patient/family education;Therapeutic exercises;Ultrasound;Therapeutic exercise;Therapeutic activities;Cognitive remediation/compensation;Passive range of motion;Neuromuscular education;Electrical Stimulation;Visual/perceptual remediation/compensation;Cryotherapy;Parrafin   Plan further address cogntion in functional context,, address LUE strength HEP   Consulted and Agree with Plan of Care Patient        Problem List Patient Active Problem List   Diagnosis Date Noted  . Coarctation of aorta, recurrent, post-intervention   .  Hypokalemia   . 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 Atrial flutter 11/16/2014  . Chest pain on respiration 10/07/2011  . ASTHMA, UNSPECIFIED, UNSPECIFIED STATUS 05/30/2009  . OTHER OSTEOPOROSIS 05/30/2009  . MITRAL VALVE REPLACEMENT, HX OF 05/30/2009    Trayce Maino 12/09/2014, 8:23 PM Keene BreathKathryn Agata Lucente, OTR/L [phone] Phone: [phone] 8:23 PM 12/09/2014 [facility] [address] [place], [place], [address] Phone: [phone]   Fax:  [phone]

## 2014-12-08 NOTE — Therapy (Signed)
[facility] [address] [place], [place], [address] Phone: [phone]   Fax:  [phone]  Physical Therapy Evaluation  Patient Details  Name: Martha Clark AreasJoyce P Woodberry MRN: [id number] Date of Birth: 06/26/1955 Referring Provider:  Merri BrunettePharr, Walter, MD  Encounter Date: 12/08/2014      PT End of Session - 12/08/14 1230    Visit Number 1   Number of Visits 3   Date for PT Re-Evaluation 01/07/15   PT Start Time 1230   PT Stop Time 1312   PT Time Calculation (min) 42 min   Equipment Utilized During Treatment Gait belt   Activity Tolerance Patient tolerated treatment well   Behavior During Therapy [facility] for tasks assessed/performed      Past Medical History  Diagnosis Date  . Atrial fibrillation   . Asthma   . Osteoporosis   . Mitral valve prolapse     Past Surgical History  Procedure Laterality Date  . Tubal ligation    . Mitral valve replacement  2008  . Radiology with anesthesia N/A 11/20/2014    Procedure: RADIOLOGY WITH ANESTHESIA;  Surgeon: Julieanne CottonSanjeev Deveshwar, MD;  [facility];  Service: Radiology;  Laterality: N/A;    There were no vitals filed for this visit.  Visit Diagnosis:  Abnormality of gait  Weakness due to cerebrovascular accident  Balance problems      Subjective Assessment - 12/08/14 1235    Subjective This 59yo patient was hospitalized 11/20/2014 with left sided weakness. she was hospitalized for 4 days. No home health. She presents for PT evaluation along with OT & speech.    Patient Stated Goals To be as strong & active as she was prior to CVA   Currently in Pain? No/denies            [facility] - 12/08/14 1230    Assessment   Medical Diagnosis CVA   Onset Date/Surgical Date 11/24/14   Precautions   Precautions Fall   Restrictions   Weight Bearing Restrictions No   Balance Screen   Has the patient fallen in the past 6 months No   Has the patient had a decrease in  activity level because of a fear of falling?  No   Is the patient reluctant to leave their home because of a fear of falling?  No   Home Environment   Living Environment Private residence   Living Arrangements Spouse/significant other;Children  17yo dtr & 20yo son   Type of Home House   Home Access Stairs to enter   Entrance Stairs-Number of Steps 3   Entrance Stairs-Rails Right;Left;Cannot reach both   Home Layout Two level;Full bath on main level;Able to live on main level with bedroom/bathroom  children & spare bedroom   Alternate Level Stairs-Number of Steps ~20   Alternate Level Stairs-Rails Left   Prior Function   Level of Independence Independent;Independent with gait;Independent with transfers   Vocation Full time employment   Vocation Requirements manages jean devision, pulling garment bads, 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   ROM / Strength   AROM / PROM / Strength Strength   Strength   Strength Assessment Site Hip;Knee;Ankle   Right/Left Hip Left   Left Hip Flexion 4+/5   Left Hip Extension 4-/5   Left Hip ABduction 4-/5   Left Hip ADduction 4/5   Right/Left Knee Left   Left Knee Flexion 4-/5   Left Knee Extension 4/5   Right/Left Ankle Left   Left Ankle  Dorsiflexion 4-/5   Left Ankle Plantar Flexion 4-/5   Left Ankle Inversion 4-/5   Left Ankle Eversion 4-/5   Transfers   Transfers Sit to Stand;Stand to Sit   Sit to Stand 6: Modified independent (Device/Increase time);Without upper extremity assist;From chair/3-in-1   Stand to Sit 6: Modified independent (Device/Increase time);Without upper extremity assist;To chair/3-in-1   Ambulation/Gait   Ambulation/Gait Yes   Ambulation/Gait Assistance 5: Supervision   Ambulation Distance (Feet) 300 Feet   Assistive device None   Gait Pattern Decreased stance time - left;Decreased dorsiflexion - left   Ambulation Surface Indoor;Level   Gait velocity 3.99 ft/sec   Stairs Yes   Stairs Assistance 5: Supervision   Stair  Management Technique No rails;Alternating pattern;Forwards   Number of Stairs 4   Standardized Balance Assessment   Standardized Balance Assessment Berg Balance Test;Timed Up and Go Test   Berg Balance Test   Sit to Stand Able to stand without using hands and stabilize independently   Standing Unsupported Able to stand safely 2 minutes   Sitting with Back Unsupported but Feet Supported on Floor or Stool Able to sit safely and securely 2 minutes   Stand to Sit Sits safely with minimal use of hands   Transfers Able to transfer safely, minor use of hands   Standing Unsupported with Eyes Closed Able to stand 10 seconds safely   Standing Ubsupported with Feet Together Able to place feet together independently and stand 1 minute safely   From Standing, Reach Forward with Outstretched Arm Can reach confidently >25 cm (10")   From Standing Position, Pick up Object from Floor Able to pick up shoe safely and easily   From Standing Position, Turn to Look Behind Over each Shoulder Looks behind from both sides and weight shifts well   Turn 360 Degrees Able to turn 360 degrees safely in 4 seconds or less   Standing Unsupported, Alternately Place Feet on Step/Stool Able to stand independently and safely and complete 8 steps in 20 seconds   Standing Unsupported, One Foot in Front Able to place foot tandem independently and hold 30 seconds   Standing on One Leg Able to lift leg independently and hold > 10 seconds  RLE >10sec, LLE    Total Score 56   Timed Up and Go Test   Normal TUG (seconds) 6.91   Cognitive TUG (seconds) 11.82  71% increase standard TUG   Functional Gait  Assessment   Gait assessed  Yes   Gait Level Surface Walks 20 ft in less than 5.5 sec, no assistive devices, good speed, no evidence for imbalance, normal gait pattern, deviates no more than 6 in outside of the 12 in walkway width.   Change in Gait Speed Able to smoothly change walking speed without loss of balance or gait deviation.  Deviate no more than 6 in outside of the 12 in walkway width.   Gait with Horizontal Head Turns Performs head turns smoothly with no change in gait. Deviates no more than 6 in outside 12 in walkway width   Gait with Vertical Head Turns Performs head turns with no change in gait. Deviates no more than 6 in outside 12 in walkway width.   Gait and Pivot Turn Pivot turns safely within 3 sec and stops quickly with no loss of balance.   Step Over Obstacle Is able to step over one shoe box (4.5 in total height) without changing gait speed. No evidence of imbalance.   Gait with Narrow  Base of Support Is able to ambulate for 10 steps heel to toe with no staggering.   Gait with Eyes Closed Walks 20 ft, no assistive devices, good speed, no evidence of imbalance, normal gait pattern, deviates no more than 6 in outside 12 in walkway width. Ambulates 20 ft in less than 7 sec.   Ambulating Backwards Walks 20 ft, no assistive devices, good speed, no evidence for imbalance, normal gait   Steps Alternating feet, must use rail.   Total Score 28                           PT Education - 12/08/14 1230    Education provided Yes   Education Details Clot signs & PE signs with need to call "911", walk with family / friend before heat / temperature rises with bench or resting points.   Person(s) Educated Patient   Methods Explanation   Comprehension Verbalized understanding             PT Long Term Goals - 12/08/14 1230    PT LONG TERM GOAL #1   Title demonstrates understanding of HEP / fitness program. (Target Date: 3rd visit)   Time 2   Period Weeks   Status New   PT LONG TERM GOAL #2   Title Timed Up & Go cognitive increases <40% from her std TUG (Target Date: 3rd visit)   Time 2   Period Weeks   Status New   PT LONG TERM GOAL #3   Title Functional Gait Assessment 28/30 (Target Date: 3rd visit)   Time 2   Period Weeks   Status New               Plan - 12/08/14 1230     Clinical Impression Statement This 58yo had CVA 11/24/2014 with left side weakness. She was active including her job which required some travel. PT testing found deficits with left LE strength, multi-tasking with gait including cognition. She would benefit on skilled instructions for fitness /exercises /activities to return to prior functional level.    Pt will benefit from skilled therapeutic intervention in order to improve on the following deficits Abnormal gait;Decreased balance;Decreased strength   Rehab Potential Good   PT Frequency 1x / week   PT Duration 2 weeks   PT Treatment/Interventions Functional mobility training;Stair training;Gait training;Therapeutic activities;Balance training;Neuromuscular re-education;Patient/family education   PT Next Visit Plan HEP for balance, strength, endurance   Consulted and Agree with Plan of Care Patient         Problem List Patient Active Problem List   Diagnosis Date Noted  . Coarctation of aorta, recurrent, post-intervention   . Hypokalemia   . 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 Atrial flutter 11/16/2014  . Chest pain on respiration 10/07/2011  . ASTHMA, UNSPECIFIED, UNSPECIFIED STATUS 05/30/2009  . OTHER OSTEOPOROSIS 05/30/2009  . MITRAL VALVE REPLACEMENT, HX OF 05/30/2009    Vladimir Faster PT, DPT 12/08/2014, 1:52 PM  [facility] [address] [place], [place], [address] Phone: [phone]   Fax:  [phone]

## 2014-12-09 NOTE — Therapy (Signed)
[facility] [address] [place], [place], [address] Phone: [phone]   Fax:  [phone]  Speech Language Pathology Evaluation  Patient Details  Name: Martha Clark MRN: [id number] Date of Birth: 10/12/1955 Referring Provider:  Merri BrunettePharr, Walter, MD  Encounter Date: 12/08/2014      End of Session - 12/09/14 1716    Visit Number 1   Number of Visits 17   Date for SLP Re-Evaluation 02/08/15   Authorization - Number of Visits 55   SLP Start Time 1319   SLP Stop Time  1401   SLP Time Calculation (min) 42 min   Activity Tolerance Patient tolerated treatment well      Past Medical History  Diagnosis Date  . Atrial fibrillation   . Asthma   . Osteoporosis   . Mitral valve prolapse     Past Surgical History  Procedure Laterality Date  . Tubal ligation    . Mitral valve replacement  2008  . Radiology with anesthesia N/A 11/20/2014    Procedure: RADIOLOGY WITH ANESTHESIA;  Surgeon: Julieanne CottonSanjeev Deveshwar, MD;  [facility];  Service: Radiology;  Laterality: N/A;    There were no vitals filed for this visit.  Visit Diagnosis: Cognitive communication deficit      Subjective Assessment - 12/08/14 1323    Subjective Pt reports she has now been able to sit and read for longer periods of time than prior to CVA. She reports it takes longer to clear the table than premorbidly.             SLP Evaluation [facility] - 12/08/14 1329    SLP Visit Information   SLP Received On 12/08/14   Onset Date 11-20-14   Medical Diagnosis CVA   Pain Assessment   Currently in Pain? 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   Prior Functional Status    Cognitive/Linguistic Baseline Within functional limits   Type of Home House    Lives With Spouse;Daughter   Vocation Full time employment   Cognition   Overall Cognitive Status Impaired/Different from baseline   Area of Impairment Attention   Attention Divided   Divided Attention Impaired   Divided Attention Impairment --  12/14 words p 3 minutes; 32/37 p 10 min; decr'd attn p 8:30   Awareness Appears intact   Executive Function --  unable to adequately assess due to time constraints   Auditory Comprehension   Overall Auditory Comprehension Appears within functional limits for tasks assessed   Verbal Expression   Overall Verbal Expression Appears within functional limits for tasks assessed   Oral Motor/Sensory Function   Overall Oral Motor/Sensory Function Appears within functional limits for tasks assessed   Motor Speech   Overall Motor Speech Appears within functional limits for tasks assessed                         SLP Education - 12/08/14 1403    Education provided Yes   Education Details compensations for attention, [employer] app, [email or web address]   Person(s) Educated Patient   Methods Explanation   Comprehension Verbalized understanding  SLP Short Term Goals - 12/09/14 1718    SLP SHORT TERM GOAL #1   Title pt to ID errors in high level cognitive linguistic tasks 100%   Time 4   Period Weeks   Status New   SLP SHORT TERM GOAL #2   Title pt will divide attention with 90% success on both tasks over 25 mintues   Time 4   Period Weeks   Status New          SLP Long Term Goals - 12/09/14 1719    SLP LONG TERM GOAL #1   Title pt will ID errors in complex cognitive linguistic tasks 100% with self-correction   Time 8   Period Weeks   Status New   SLP LONG TERM GOAL #2   Title pt will divide attention in 35 minutes complex cognitive linguistic tasks with 100% success with self-correction   Time 8   Period Weeks   Status New           Plan - 12/09/14 1717    Clinical Impression Statement Pt presents with high level divided attention deficit with linguistic tasks. Skilled ST is needed to improve these abilities for pt to return to the workforce. Executive function may also be impaired with linguistic tasks. SLP to monitor.   Speech Therapy Frequency 2x / week   Duration --  8 weeks   Treatment/Interventions SLP instruction and feedback;Compensatory strategies;Patient/family education;Functional tasks;Cueing hierarchy;Cognitive reorganization   Potential to Achieve Goals Good        Problem List Patient Active Problem List   Diagnosis Date Noted  . Coarctation of aorta, recurrent, post-intervention   . Hypokalemia   . 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 Atrial flutter 11/16/2014  . Chest pain on respiration 10/07/2011  . ASTHMA, UNSPECIFIED, UNSPECIFIED STATUS 05/30/2009  . OTHER OSTEOPOROSIS 05/30/2009  . MITRAL VALVE REPLACEMENT, HX OF 05/30/2009    [facility] , MS, CCC-SLP  12/09/2014, 5:23 PM  [facility] [address] [place], [place], [address] Phone: [phone]   Fax:  [phone]

## 2014-12-09 NOTE — Addendum Note (Signed)
Addended by: Keene BreathINE, KATHRYN B on: 12/09/2014 08:25 PM   Modules accepted: Orders

## 2014-12-13 ENCOUNTER — Encounter: Payer: Self-pay | Admitting: Occupational Therapy

## 2014-12-13 ENCOUNTER — Encounter: Payer: Self-pay | Admitting: Physical Therapy

## 2014-12-13 ENCOUNTER — Ambulatory Visit: Payer: BLUE CROSS/BLUE SHIELD | Admitting: Physical Therapy

## 2014-12-13 ENCOUNTER — Ambulatory Visit: Payer: BLUE CROSS/BLUE SHIELD | Admitting: Occupational Therapy

## 2014-12-13 DIAGNOSIS — R269 Unspecified abnormalities of gait and mobility: Secondary | ICD-10-CM

## 2014-12-13 DIAGNOSIS — IMO0002 Reserved for concepts with insufficient information to code with codable children: Secondary | ICD-10-CM

## 2014-12-13 DIAGNOSIS — I69898 Other sequelae of other cerebrovascular disease: Secondary | ICD-10-CM | POA: Diagnosis not present

## 2014-12-13 DIAGNOSIS — R6889 Other general symptoms and signs: Secondary | ICD-10-CM

## 2014-12-13 DIAGNOSIS — R2689 Other abnormalities of gait and mobility: Secondary | ICD-10-CM

## 2014-12-13 DIAGNOSIS — I69319 Unspecified symptoms and signs involving cognitive functions following cerebral infarction: Secondary | ICD-10-CM

## 2014-12-13 NOTE — Therapy (Signed)
[facility] [address] [place], [place], [address] Phone: [phone]   Fax:  [phone]  Occupational Therapy Treatment  Patient Details  Name: Martha Clark MRN: [id number] Date of Birth: 03-18-1956 Referring Provider:  Merri Brunette, MD  Encounter Date: 12/13/2014      OT End of Session - 12/13/14 1543    Visit Number 2   Number of Visits 17   Authorization Type BCBS, 60 visit combined, 55 remaining ?at time of eval   OT Start Time 1535   OT Stop Time 1615   OT Time Calculation (min) 40 min   Activity Tolerance Patient tolerated treatment well   Behavior During Therapy [facility] for tasks assessed/performed;Flat affect      Past Medical History  Diagnosis Date  . Atrial fibrillation   . Asthma   . Osteoporosis   . Mitral valve prolapse     Past Surgical History  Procedure Laterality Date  . Tubal ligation    . Mitral valve replacement  2008  . Radiology with anesthesia N/A 11/20/2014    Procedure: RADIOLOGY WITH ANESTHESIA;  Surgeon: Julieanne Cotton, MD;  Location: [facility] OR;  Service: Radiology;  Laterality: N/A;    There were no vitals filed for this visit.  Visit Diagnosis:  Cognitive deficits following cerebral infarction  Weakness due to cerebrovascular accident      Subjective Assessment - 12/13/14 1542    Subjective  "I didn't do so well the last time that I was here"   Pertinent History MVP s/p bovine mitral valve replacement, A-fib, (see EPIC snap shot for additional info)   Patient Stated Goals return to work   Currently in Pain? No/denies                      OT Treatments/Exercises (OP) - 12/13/14 0001    ADLs   Overall ADLs Recommended online support groups for CVA and discussed fatigue and emotional changes after stroke (long discussion and handouts provided).  Recommended pt discuss with MD if they persist.  Pt reports that she may need procedure for a-fib and that  doctor told her that this may also incr fatigue.  Reviewed difficulty with divided attention and LUE weakness as pt reports that she hasn't been aware (but reports that evaluating therapists have discussed this during assessment).  Pt did report difficulty with multi-tasking, sequencing with new recipe recently after discussion.  Pt also reports feelings of worry.   Cognitive Exercises   Organization Complex "Organizing Your Day"  #1 with only 1 omission (forgot to order cake before pick-up); however, all other times and sequence was appropriate in minor increase in time.  Pt acknowledged and corrected error once identified.   Exercises   Exercises Hand   Hand Exercises   Other Hand Exercises Reviewed red putty exercises that pt had from hospital and made recommendations of performing finger adduction with putty instead of ball and individual finger pinch with all digits instead of just with 2nd digit/thumb.  Pt verbalized understanding.                OT Education - 12/13/14 1628    Education provided Yes   Education Details Fatigue after CVA, Emotional Changes after CVA, CVA warning signs, TIA vs. CVA   Person(s) Educated Patient   Methods Explanation;Demonstration;Verbal cues;Handout   Comprehension Verbalized understanding;Returned demonstration          OT Short Term Goals - 12/09/14 1957  OT SHORT TERM GOAL #1   Title I with HEP.   Baseline 01/07/15   Time 4   Period Weeks   Status New   OT SHORT TERM GOAL #2   Title Pt will increase LUE grip strength  to at least 25 lbs for increased functional use.   Time 4   Period Weeks   Status New   OT SHORT TERM GOAL #3   Title Pt will demonstrate ability to perform a physical and cognitive task simultaneously with 80% or greater acccuracy and no greater than 1 LOB.   Time 4   Period Weeks   Status New   OT SHORT TERM GOAL #4   Title Pt will demonstrate adequate LUE strength and endurance to retrieve 5 lbs weight from  overhead shelf x 5 reps without drops.   Time 4   Period Weeks   Status New   OT SHORT TERM GOAL #5   Title Pt will demonstrate ability to organize and complete a mod complex cooking task, demonstrating good safety awareness, modified independently.   Time 4   Period Weeks   Status New   Additional Short Term Goals   Additional Short Term Goals Yes   OT SHORT TERM GOAL #6   Title further assess cognition and add additionl goal PRN.   Time 4   Period Weeks   Status New           OT Long Term Goals - 12/09/14 2005    OT LONG TERM GOAL #1   Title Pt will demonstrate LUE grip strength of 30 lbs or greater for increased functional use,   Baseline due 02/06/15   Time 8   Status New   OT LONG TERM GOAL #2   Title Pt will demonstrate ability to perform a physical and cognitive task simultaneously, with 95%  or better accuracy and no LOB.   Time 8   Period Weeks   Status New   OT LONG TERM GOAL #3   Title Pt will report adequate strength and endurance to perform  home mnagement/ cooking activities in standing for 1 hour prior to rest break.   Time 8   Period Weeks   Status New   OT LONG TERM GOAL #4   Title Pt will demonstrate ability to perfom a moderate complex organization task with 95% or greater accuracy.   Time 8   Period Weeks   Status New               Plan - 12/13/14 1642    Clinical Impression Statement Pt verbalized understanding of education provided regarding CVA.  Pt reports feelings of fatigue and apathy which is abnormal for her.  May need to monitor to see if neuropsych consult may be appropriate.  Pt did well with complex organizing task with only 1 error.   Plan LUE strength HEP (?theraband), has red putty HEP from hospital, further address cognition in functional context.   Consulted and Agree with Plan of Care Patient        Problem List Patient Active Problem List   Diagnosis Date Noted  . Coarctation of aorta, recurrent, post-intervention    . Hypokalemia   . 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 Atrial flutter 11/16/2014  . Chest pain on respiration 10/07/2011  . ASTHMA, UNSPECIFIED, UNSPECIFIED STATUS 05/30/2009  . OTHER OSTEOPOROSIS 05/30/2009  . MITRAL VALVE REPLACEMENT, HX OF 05/30/2009    [facility]  12/13/2014, 4:45 PM  [place] [facility] [address] [place], [place], [address] Phone: [phone]   Fax:  [phone]   Willa Fraterngela Freeman, OTR/L 12/13/2014 4:45 PM

## 2014-12-13 NOTE — Therapy (Signed)
[facility] [address] [place], [place], [address] Phone: [phone]   Fax:  [phone]  Physical Therapy Treatment  Patient Details  Name: Martha AreasJoyce P Sirianni MRN: [id number] Date of Birth: 01/31/1956 Referring Provider:  Merri BrunettePharr, Walter, MD  Encounter Date: 12/13/2014      PT End of Session - 12/13/14 1742    Visit Number 2   Number of Visits 3   Date for PT Re-Evaluation 01/07/15   PT Start Time 1615   PT Stop Time 1710   PT Time Calculation (min) 55 min   Equipment Utilized During Treatment Gait belt   Activity Tolerance Patient tolerated treatment well   Behavior During Therapy [facility] for tasks assessed/performed      Past Medical History  Diagnosis Date  . Atrial fibrillation   . Asthma   . Osteoporosis   . Mitral valve prolapse     Past Surgical History  Procedure Laterality Date  . Tubal ligation    . Mitral valve replacement  2008  . Radiology with anesthesia N/A 11/20/2014    Procedure: RADIOLOGY WITH ANESTHESIA;  Surgeon: Julieanne CottonSanjeev Deveshwar, MD;  [facility];  Service: Radiology;  Laterality: N/A;    There were no vitals filed for this visit.  Visit Diagnosis:  Weakness due to cerebrovascular accident  Abnormality of gait  Balance problems  Decreased functional activity tolerance      Subjective Assessment - 12/13/14 1621    Subjective No falls since evaluation. She has been walking every morning for 30 minutes.    Currently in Pain? No/denies            [facility] PT Assessment - 12/13/14 0001    Balance Master Testing    Results Composite score 74 which is within age norm, all sensory analysis WNL, strategy analysis WNL, Center of gravity posterior & left     Therapeutic Exercise: PT instructed in fitness plan. See pt instructions. Treadmill 3.370mph for 6 min on gait trainer program. PT demo /instructed in safe use of Treadmill, pt return demonstration & verbalized  understanding.  Neuromuscular Re-education: HEP for balance including corner vestibular exercises: see pt instructions. Sensory Organization Test                        PT Education - 12/13/14 1741    Education provided Yes   Education Details Fitness plan, balance HEP   Person(s) Educated Patient   Methods Explanation;Demonstration;Handout;Verbal cues   Comprehension Verbalized understanding;Returned demonstration;Verbal cues required;Need further instruction             PT Long Term Goals - 12/08/14 1230    PT LONG TERM GOAL #1   Title demonstrates understanding of HEP / fitness program. (Target Date: 3rd visit)   Time 2   Period Weeks   Status New   PT LONG TERM GOAL #2   Title Timed Up & Go cognitive increases <40% from her std TUG (Target Date: 3rd visit)   Time 2   Period Weeks   Status New   PT LONG TERM GOAL #3   Title Functional Gait Assessment 28/30 (Target Date: 3rd visit)   Time 2   Period Weeks   Status New               Plan - 12/13/14 1745    Clinical Impression Statement Patient seems to understand HEP to safely perform at home. She had difficulty with balance activities in corner standing  on foam especially with eyes closed. PT performed a Sensory Organization Test to rule out other balance issues not found at evaluation but she tested within her age normal.    Pt will benefit from skilled therapeutic intervention in order to improve on the following deficits Abnormal gait;Decreased balance;Decreased strength   Rehab Potential Good   PT Frequency 1x / week   PT Duration 2 weeks   PT Treatment/Interventions Functional mobility training;Stair training;Gait training;Therapeutic activities;Balance training;Neuromuscular re-education;Patient/family education   PT Next Visit Plan Assess HEP   Recommended Other Services Patient to check out local fitness centers - PT gave name of [facility] and [employer] to start with.    Consulted and Agree with Plan of Care Patient        Problem List Patient Active Problem List   Diagnosis Date Noted  . Coarctation of aorta, recurrent, post-intervention   . Hypokalemia   . 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 Atrial flutter 11/16/2014  . Chest pain on respiration 10/07/2011  . ASTHMA, UNSPECIFIED, UNSPECIFIED STATUS 05/30/2009  . OTHER OSTEOPOROSIS 05/30/2009  . MITRAL VALVE REPLACEMENT, HX OF 05/30/2009    Vladimir Faster PT, DPT 12/13/2014, 6:01 PM  [place] [facility] [address] [place], [place], [address] Phone: [phone]   Fax:  [phone]

## 2014-12-13 NOTE — Patient Instructions (Addendum)
Fitness Plan has 4 components.  1. Endurance - Recommend machines that are sitting with back support that uses both your arms and your legs.Or can do walking program Goal is 20-30 minutes. 2. Strength - weight machines enough weight to have resistance but not so much that you have to strain or cheat.  Goal is to do 15 repetitions for 2-3 sets. Rest 30-60 seconds between sets.  Do 2-4 leg machines, 2-4 arm machines & 2-4 trunk machines. Look for pictures to make sure you exercise both sides of arm, leg or trunk.  Leg machines like leg press (can do both legs or each leg by themselves),   At home can do theraband exercises for arms or legs, floor transfers, sit to stand to sit using arms as little as possible, Yoga positions 3.Flexibility - make sure you include arms, legs & trunk. Can do Yoga also 4. Balance- can work in corner with chair in front - head turns, arm motions, eyes closed; place one foot inside cabinet or on 4-6" block to work on one-legged stance,   Try to get to each component 3-5 times per week.  Going to [facility] or fitness center you want do things you can not do at home.  For example use bikes at [facility] if you don't have one at home. Then on days you don't go to [facility], do exercises at home.   Copyright  VHI. All rights reserved.  Hip Abduction (Standing)   Stand with support. Lift right leg out to side, keeping toe forward. Alternate legs. Lift left leg out to side.  Repeat __10_ times.    Copyright  VHI. All rights reserved.  FLEXION: Standing - Stable (Active)   Hold onto support of counter Stand, both feet flat. Bend right knee, bringing heel toward buttocks. Alternate legs. Complete _10__ repetitions.   http://gtsc.exer.us/241   Copyright  VHI. All rights reserved.  EXTENSION: Standing (Active)   Stand with support. Move right leg backward with straight knee.  Alternate legs. Move left leg backward with straight knee. Repeat _10__ times on each leg  alternating.  http://gtsc.exer.us/77     Copyright  VHI. All rights reserved.  March   Stand near counter for support. March forward lifting knees up. March backwards.   Copyright  VHI. All rights reserved.  Tandem Walking   Stand near counter with support. Walk with each foot directly in front of other, heel of one foot touching toes of other foot with each step. Both feet straight ahead. Go forward and backward with heel-toe walking like a "tight rope"   Copyright  VHI. All rights reserved.  Side-Stepping   Stand near counter for support. Walk to left side with eyes open. Take even steps, leading with same foot. Make sure each foot lifts off the floor. Repeat in opposite direction. Do laps enough that you take ~10 steps each direction.  Copyright  VHI. All rights reserved.  Crossovers   Move to side: 1) cross right leg in front, then 2) bring back leg out to side. Repeat sequence in same direction. Reverse sequence, moving in opposite direction. Repeat both ~10 steps going to right & ~10 steps going to left.   Copyright  VHI. All rights reserved.  Braiding   Stand near counter for support. Move to side: 1) cross right leg in front of left, 2) bring back leg out to side, then 3) cross right leg behind left, 4) bring left leg out to side. Continue sequence in same direction.  Reverse sequence, moving in opposite direction.   Copyright  VHI. All rights reserved.  AMBULATION: Walk Backward   Walk backward. Take large steps, do not drag feet. 10 steps   Feet Heel-Toe "Tandem", Head Motion - Eyes Open   With eyes open, left foot directly in front of the other, move head slowly: up and down, side to side, up-right to down-left and up-left to down-right. Repeat __10-15__ times each way per session. Do __1-2__ sessions per day.  Copyright  VHI. All rights reserved.  Feet Partial Heel-Toe, Head Motion - Eyes Closed   With eyes closed and right foot  partially in front of the other, move head slowly, up and down, . Repeat ____ times per session. Do ____ sessions per day.  Copyright  VHI. All rights reserved.  Feet Partial Heel-Toe (Compliant Surface) Head Motion - Eyes Open   With eyes open, standing on compliant surface: ________, right foot partially in front of the other, move head slowly: up and down. Repeat ____ times per session. Do ____ sessions per day.  Copyright  VHI. All rights reserved.  Feet Together (Compliant Surface) Head Motion - Eyes Closed   Stand on compliant surface: ________ with feet together. Close eyes and move head slowly, up and down. Repeat ____ times per session. Do ____ sessions per day.  Copyright  VHI. All rights reserved.

## 2014-12-16 ENCOUNTER — Other Ambulatory Visit: Payer: Self-pay

## 2014-12-16 NOTE — Patient Outreach (Signed)
[facility] Care Management  12/16/2014  Martha Clark 10/26/1955 [id number]   Subjective:  Telephone call to patient regarding EMMI stroke transition follow up.  Patient states she is doing much better today. Patient reports going to physical therapy and has one session left with the physical therapist. Patient states she still has some balance issues and that the therapist felt one more session would be helpful.  Patient states she is still working with occupational therapy.  Patient reports having some left side weakness but improving.  Reports at times her energy level is challenged. Patient reports she has kept her follow up appointment with her primary physician, Dr. Renne Crigler.  Patient reports up coming appointments with neurology.  Patient express concern about her family history of strokes.  Stated her father had 5 strokes and each time it was different.  States she is trying not to worry about whether she will have another stroke.   Assessment:   RN CM discussed with patient preventive care, taking medications as ordered, knowing when to call her doctor and an action plan if stroke symptoms occur.  Patient acknowledges understanding.  Patient verbalized agreement to receive follow up call from RN CM  within one week.  Plan:  RN CM will follow up with patient with in one week.             RN CM will mail EMMI educational material to patient.  Wynonia Lawman, RN, Lowella Dell, [facility] Telephonic Care Coordinator [phone]

## 2014-12-17 ENCOUNTER — Ambulatory Visit: Payer: BLUE CROSS/BLUE SHIELD

## 2014-12-17 ENCOUNTER — Ambulatory Visit: Payer: BLUE CROSS/BLUE SHIELD | Admitting: Occupational Therapy

## 2014-12-17 ENCOUNTER — Ambulatory Visit (HOSPITAL_COMMUNITY)
Admission: RE | Admit: 2014-12-17 | Discharge: 2014-12-17 | Disposition: A | Discharge status: Home / Self Care | Payer: BLUE CROSS/BLUE SHIELD | Source: Ambulatory Visit | Attending: Interventional Radiology | Admitting: Interventional Radiology

## 2014-12-17 ENCOUNTER — Ambulatory Visit (INDEPENDENT_AMBULATORY_CARE_PROVIDER_SITE_OTHER): Payer: BLUE CROSS/BLUE SHIELD | Admitting: Internal Medicine

## 2014-12-17 ENCOUNTER — Encounter: Payer: Self-pay | Admitting: *Deleted

## 2014-12-17 VITALS — BP 96/72 | HR 87 | Ht 67.0 in | Wt 123.6 lb

## 2014-12-17 DIAGNOSIS — R6889 Other general symptoms and signs: Secondary | ICD-10-CM

## 2014-12-17 DIAGNOSIS — I69319 Unspecified symptoms and signs involving cognitive functions following cerebral infarction: Secondary | ICD-10-CM

## 2014-12-17 DIAGNOSIS — I69898 Other sequelae of other cerebrovascular disease: Secondary | ICD-10-CM | POA: Diagnosis not present

## 2014-12-17 DIAGNOSIS — IMO0002 Reserved for concepts with insufficient information to code with codable children: Secondary | ICD-10-CM

## 2014-12-17 DIAGNOSIS — I4892 Unspecified atrial flutter: Secondary | ICD-10-CM | POA: Diagnosis not present

## 2014-12-17 DIAGNOSIS — I6340 Cerebral infarction due to embolism of unspecified cerebral artery: Secondary | ICD-10-CM

## 2014-12-17 DIAGNOSIS — I639 Cerebral infarction, unspecified: Secondary | ICD-10-CM

## 2014-12-17 DIAGNOSIS — R41841 Cognitive communication deficit: Secondary | ICD-10-CM

## 2014-12-17 NOTE — Assessment & Plan Note (Signed)
Unclear if she actually had atrial fib or flutter back in June. She clearly has atrial flutter now. She will need long term anti-coagulation.

## 2014-12-17 NOTE — Patient Instructions (Signed)

## 2014-12-17 NOTE — Therapy (Signed)
[facility] [address] [place], [place], [address] Phone: [phone]   Fax:  [phone]  Occupational Therapy Treatment  Patient Details  Name: Martha Clark MRN: [id number] Date of Birth: 15-May-1956 Referring Provider:  Merri Brunette, MD  Encounter Date: 12/17/2014      OT End of Session - 12/17/14 1143    Visit Number 3   Number of Visits 17   Authorization Type BCBS, 60 visit combined, 55 remaining ?at time of eval   OT Start Time 1106   OT Stop Time 1145   OT Time Calculation (min) 39 min   Activity Tolerance Patient tolerated treatment well   Behavior During Therapy WFL for tasks assessed/performed      Past Medical History  Diagnosis Date  . Atrial fibrillation   . Asthma   . Osteoporosis   . Mitral valve prolapse   . History of CVA (cerebrovascular accident) 11/20/14  . 1St degree AV block   . Typical atrial flutter     Past Surgical History  Procedure Laterality Date  . Tubal ligation    . Mitral valve replacement  2008  . Radiology with anesthesia N/A 11/20/2014    Procedure: RADIOLOGY WITH ANESTHESIA;  Surgeon: Julieanne Cotton, MD;  Location: [facility] OR;  Service: Radiology;  Laterality: N/A;    There were no vitals filed for this visit.  Visit Diagnosis:  Weakness due to cerebrovascular accident  Cognitive deficits following cerebral infarction  Decreased functional activity tolerance      Subjective Assessment - 12/17/14 1110    Pertinent History MVP s/p bovine mitral valve replacement, A-fib, (see EPIC snap shot for additional info)   Patient Stated Goals return to work   Currently in Pain? No/denies      Treatment: Theraputic activities: Divided attention for category generation, while ambulating/ tossing ball, mod v.c. Pt recognized she needed significant prompting.  Therapist discussed with pt. She may benefit from neuropsych testing prior to return to work. Pt reports MD has  only written her out on leave through Aug 18th.  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 Pt also reports apathy/ difficulty with adjustment and may benefit from counseling.  Ther ex: Pt was instructed in red theraband HEP for strengthening, see pt instructions.                        OT Education - 12/17/14 1610    Education provided Yes   Education Details Theraband, red   Person(s) Educated Patient   Methods Explanation;Demonstration;Verbal cues;Handout   Comprehension Verbalized understanding;Returned demonstration          OT Short Term Goals - 12/17/14 1142    OT SHORT TERM GOAL #1   Title I with HEP.   Baseline 01/07/15   Time 4   Period Weeks   Status New   OT SHORT TERM GOAL #2   Title Pt will increase LUE grip strength  to at least 25 lbs for increased functional use.   Time 4   Period Weeks   Status New   OT SHORT TERM GOAL #3   Title Pt will demonstrate ability to perform a physical and cognitive task simultaneously with 80% or greater acccuracy and no greater than 1 LOB.   Time 4   Period Weeks   Status New   OT SHORT TERM GOAL #4  Title Pt will demonstrate adequate LUE strength and endurance to retrieve 5 lbs weight from overhead shelf x 5 reps without drops.   Time 4   Period Weeks   Status New   OT SHORT TERM GOAL #5   Title Pt will demonstrate ability to organize and complete a mod complex cooking task, demonstrating good safety awareness, modified independently.   Time 4   Period Weeks   Status New   OT SHORT TERM GOAL #6   Title further assess cognition and add additionl goal PRN.   Baseline deferred to ST, OT to address in functional context, Pt may benefit from neuro psych testing prior to return to work   Time 4   Period Weeks   Status Deferred           OT Long Term Goals - 12/09/14 2005    OT LONG TERM GOAL #1   Title  Pt will demonstrate LUE grip strength of 30 lbs or greater for increased functional use,   Baseline due 02/06/15   Time 8   Status New   OT LONG TERM GOAL #2   Title Pt will demonstrate ability to perform a physical and cognitive task simultaneously, with 95%  or better accuracy and no LOB.   Time 8   Period Weeks   Status New   OT LONG TERM GOAL #3   Title Pt will report adequate strength and endurance to perform  home mnagement/ cooking activities in standing for 1 hour prior to rest break.   Time 8   Period Weeks   Status New   OT LONG TERM GOAL #4   Title Pt will demonstrate ability to perfom a moderate complex organization task with 95% or greater accuracy.   Time 8   Period Weeks   Status New               Plan - 12/17/14 1606    Clinical Impression Statement Pt is progressing towards goals yet she demonstrates difficulty with divided attention. Pt may benefit from neuropysch consult for testing as well as counseling for adjustment.   Pt will benefit from skilled therapeutic intervention in order to improve on the following deficits (Retired) Decreased endurance;Decreased activity tolerance;Impaired UE functional use;Decreased cognition;Decreased mobility;Decreased strength   Rehab Potential Good   OT Frequency 2x / week   OT Duration 8 weeks   OT Treatment/Interventions Self-care/ADL training;Fluidtherapy;Moist Heat;DME and/or AE instruction;Patient/family education;Therapeutic exercises;Ultrasound;Therapeutic exercise;Therapeutic activities;Cognitive remediation/compensation;Passive range of motion;Neuromuscular education;Electrical Stimulation;Visual/perceptual remediation/compensation;Cryotherapy;Parrafin   Plan review theraband, divided attention for physical/ cognitive task or cooking   Consulted and Agree with Plan of Care Patient        Problem List Patient Active Problem List   Diagnosis Date Noted  . Coarctation of aorta, recurrent, post-intervention   .  Hypokalemia   . 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 Atrial flutter 11/16/2014  . Chest pain on respiration 10/07/2011  . ASTHMA, UNSPECIFIED, UNSPECIFIED STATUS 05/30/2009  . OTHER OSTEOPOROSIS 05/30/2009  . MITRAL VALVE REPLACEMENT, HX OF 05/30/2009    Deatrice Spanbauer 12/17/2014, 4:16 PM Keene Breath, OTR/L [phone] Phone: [phone] 4:16 PM 12/17/2014 [facility] [address] [place], [place], [address] Phone: [phone]   Fax:  [phone]

## 2014-12-17 NOTE — Assessment & Plan Note (Signed)
I have discussed the treatment options with the patient and her husband. I have recommended she undergo catheter ablation of atrial flutter. This will be scheduled in the coming weeks.

## 2014-12-17 NOTE — Progress Notes (Signed)
HPI  Mrs. Martha Clark is referred today by Dr. Jacinto Halim for evaluation of atrial flutter. 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 She had sob and palpitations which awakened her from sleep a month ago, she presented to the ED where she was in either atrial fib or flutter, difficult to discern, and was cardioverted. 3 days later she had a stroke. She has undergone rehab and has minimal deficit. She has subsequently developed atrial flutter with a RVR, now controlled VR. She denies syncope. She has minimal palpitations but notes that her energy level is reduced.  No Known Allergies   Current Outpatient Prescriptions  Medication Sig Dispense Refill  . acetaminophen (TYLENOL) 325 MG tablet Take 1,300 mg by mouth every 6 (six) hours as needed for mild pain.    Marland Kitchen apixaban (ELIQUIS) 5 MG TABS tablet Take 1 tablet (5 mg total) by mouth 2 (two) times daily. 60 tablet 12  . buPROPion (WELLBUTRIN XL) 150 MG 24 hr tablet Take 150 mg by mouth Daily.     . calcium citrate-vitamin D (CITRACAL+D) 315-200 MG-UNIT per tablet Take 1 tablet by mouth daily.      . Cholecalciferol (VITAMIN D) 1000 UNITS capsule Take 1,000 Units by mouth daily.      . Coenzyme Q10 (COQ-10) 100 MG CAPS Take 1 tablet by mouth daily.      Marland Kitchen diltiazem (CARDIZEM CD) 180 MG 24 hr capsule Take 1 capsule (180 mg total) by mouth daily. 30 capsule 2  . Fish Oil OIL Take 15 mLs by mouth daily.     Marland Kitchen MAGNESIUM CHLORIDE PO Take 1 tablet by mouth daily.      . metoprolol tartrate (LOPRESSOR) 25 MG tablet Take 1 tablet (25 mg total) by mouth 2 (two) times daily. 60 tablet 2  . Multiple Vitamin (MULTIVITAMIN WITH MINERALS) TABS tablet Take 1 tablet by mouth daily.    . vitamin C (ASCORBIC ACID) 500 MG tablet Take 500 mg by mouth daily.     No current facility-administered medications for this visit.     Past Medical History   Diagnosis Date  . Atrial fibrillation   . Asthma   . Osteoporosis   . Mitral valve prolapse   . History of CVA (cerebrovascular accident) 11/20/14  . 1St degree AV block   . Typical atrial flutter     ROS:   All systems reviewed and negative except as noted in the HPI.   Past Surgical History  Procedure Laterality Date  . Tubal ligation    . Mitral valve replacement  2008  . Radiology with anesthesia N/A 11/20/2014    Procedure: RADIOLOGY WITH ANESTHESIA;  Surgeon: Julieanne Cotton, MD;  Location: [facility] OR;  Service: Radiology;  Laterality: N/A;     Family History  Problem Relation Age of Onset  . Diabetes Father   . Stroke Father   . Hypertension Sister   . Cancer Paternal Uncle     lung cancer  . Breast cancer Maternal Grandmother 71     History   Social History  . Marital Status: Married    Spouse Name: N/A  . Number of Children: N/A  . Years of Education: N/A   Occupational History  . Not on file.   Social History Main Topics  . Smoking status: Never Smoker   . Smokeless tobacco: Not on file  .  Alcohol Use: No  . Drug Use: No  . Sexual Activity: Not Currently    Birth Control/ Protection: None   Other Topics Concern  . Not on file   Social History Narrative     BP 96/72 mmHg  Pulse 87  Ht 5\' 7"  (1.702 m)  Wt 123 lb 9.6 oz (56.065 kg)  BMI 19.35 kg/m2  Physical Exam:  Well appearing 59 yo woman, NAD HEENT: Unremarkable Neck:  No JVD, no thyromegally Lymphatics:  No adenopathy Back:  No CVA tenderness Lungs:  Clear with no wheezes HEART:  IRegular rate rhythm, no murmurs, no rubs, no clicks Abd:  soft, positive bowel sounds, no organomegally, no rebound, no guarding Ext:  2 plus pulses, no edema, no cyanosis, no clubbing Skin:  No rashes no nodules Neuro:  CN II through XII intact, motor grossly intact  EKG - atrial flutter with a controlled VR  Assess/Plan:

## 2014-12-17 NOTE — Patient Instructions (Signed)
Medication Instructions:  Your physician recommends that you continue on your current medications as directed. Please refer to the Current Medication list given to you today.   Labwork: Your physician recommends that you return for lab work on 01/10/15 at 10:00   Testing/Procedures: Your physician has recommended that you have an ablation. Catheter ablation is a medical procedure used to treat some cardiac arrhythmias (irregular heartbeats). During catheter ablation, a long, thin, flexible tube is put into a blood vessel in your groin (upper thigh), or neck. This tube is called an ablation catheter. It is then guided to your heart through the blood vessel. Radio frequency waves destroy small areas of heart tissue where abnormal heartbeats may cause an arrhythmia to start. Please see the instruction sheet given to you today.    Follow-Up: Your physician recommends that you schedule a follow-up appointment 4 weeks from 01/17/15 after procedure   Any Other Special Instructions Will Be Listed Below (If Applicable).

## 2014-12-17 NOTE — Patient Instructions (Signed)
Complete divided attention tasks at home over the weekend.  Divided Attention ideas - any can be done together  Playing a card game either by yourself or with someone  Playing a board game with someone  Sorting playing cards  Writing down commercials on TV or the radio  Writing down news stories on TV or radio  Working outside with yard work, gardening, Catering manager.  [employer] [place]  Recite state names and capitals  Complete long division or 2 or 3 digit multiplication problems  Listen to a Therapist, sports (i.e., YouTube), to online news (NPR, CNN, etc.), or to a family member, and give pertinent information from the speech, news story, or conversation after it is over  Empty the dishwasher  Complete paper/pencil puzzles (sudoku, crosswords, word search)  Complete puzzles online (www.lumosity.com -computer/tablet based, or www.constanttherapy.com - iPad based)  Therapy worksheets/exercises  Put together a puzzle  State coins needed to make a certain change amount  Have someone read a portion of a novel to you, and you give a summary    Start at a level that you are challenged by, but also where you can see some success

## 2014-12-17 NOTE — Therapy (Signed)
[facility] [address] [place], [place], [address] Phone: [phone]   Fax:  [phone]  Speech Language Pathology Treatment  Patient Details  Name: Martha Clark MRN: [id number] Date of Birth: 11/06/1955 Referring Provider:  Merri Brunette, MD  Encounter Date: 12/17/2014      End of Session - 12/17/14 1105    Visit Number 2   Date for SLP Re-Evaluation 02/08/15   SLP Start Time 1022   SLP Stop Time  1105   SLP Time Calculation (min) 43 min   Activity Tolerance Patient tolerated treatment well      Past Medical History  Diagnosis Date  . Atrial fibrillation   . Asthma   . Osteoporosis   . Mitral valve prolapse   . History of CVA (cerebrovascular accident) 11/20/14  . 1St degree AV block   . Typical atrial flutter     Past Surgical History  Procedure Laterality Date  . Tubal ligation    . Mitral valve replacement  2008  . Radiology with anesthesia N/A 11/20/2014    Procedure: RADIOLOGY WITH ANESTHESIA;  Surgeon: Julieanne Cotton, MD;  Location: [facility] OR;  Service: Radiology;  Laterality: N/A;    There were no vitals filed for this visit.  Visit Diagnosis: Cognitive communication deficit      Subjective Assessment - 12/17/14 1026    Subjective Pt reported that she completed "Organizing your day" task last visit with OT.               ADULT SLP TREATMENT - 12/17/14 1028    General Information   Behavior/Cognition Alert;Cooperative;Pleasant mood   Treatment Provided   Treatment provided Cognitive-Linquistic   Pain Assessment   Pain Assessment No/denies pain   Cognitive-Linquistic Treatment   Treatment focused on Cognition   Skilled Treatment Skilled ST targeted higher level thinking skills (executive function/organization/reasoning) today in conversation. Pt was engaging in conversation with SLP about complex topics without notable difficulty, however alternating attentoin appeared mildly  impaired as pt with pause time to answer SLP questions alternating with internet info lookup (basal ganglia CVA).    Assessment / Recommendations / Plan   Plan Continue with current plan of care   Progression Toward Goals   Progression toward goals Progressing toward goals          SLP Education - 12/17/14 1514    Education provided Yes   Education Details home tasks   Person(s) Educated Patient   Methods Explanation;Handout   Comprehension Verbalized understanding          SLP Short Term Goals - 12/17/14 1516    SLP SHORT TERM GOAL #1   Title pt to ID errors in high level cognitive linguistic tasks 100%   Time 4   Period Weeks   Status On-going   SLP SHORT TERM GOAL #2   Title pt will divide attention with 90% success on both tasks over 25 mintues   Time 4   Period Weeks   Status On-going          SLP Long Term Goals - 12/17/14 1516    SLP LONG TERM GOAL #1   Title pt will ID errors in complex cognitive linguistic tasks 100% with self-correction   Time 8   Period Weeks   Status On-going   SLP LONG TERM GOAL #2   Title pt will divide attention in 35 minutes complex cognitive linguistic tasks with 100% success with self-correction   Time 8  Period Weeks   Status On-going          Plan - 12/17/14 1515    Clinical Impression Statement Executive function in linguistic tasks today appears WNL. Continue with divided attention tasks necessary for pt's prep for return to high linguistic-cognitive demand work. Alternating attention during internet search/info gathering appeared very mildly delayed for pt's intelligence level.   Speech Therapy Frequency 2x / week   Duration --  8 weeks   Treatment/Interventions SLP instruction and feedback;Compensatory strategies;Patient/family education;Functional tasks;Cueing hierarchy;Cognitive reorganization   Potential to Achieve Goals Good        Problem List Patient Active Problem List   Diagnosis Date Noted  .  Coarctation of aorta, recurrent, post-intervention   . Hypokalemia   . 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 Atrial flutter 11/16/2014  . Chest pain on respiration 10/07/2011  . ASTHMA, UNSPECIFIED, UNSPECIFIED STATUS 05/30/2009  . OTHER OSTEOPOROSIS 05/30/2009  . MITRAL VALVE REPLACEMENT, HX OF 05/30/2009    [facility] , MS, CCC-SLP   12/17/2014, 3:17 PM  [facility] [address] [place], [place], [address] Phone: [phone]   Fax:  [phone]

## 2014-12-20 ENCOUNTER — Encounter: Payer: Self-pay | Admitting: Physical Therapy

## 2014-12-20 ENCOUNTER — Ambulatory Visit: Payer: BLUE CROSS/BLUE SHIELD | Admitting: Physical Therapy

## 2014-12-20 ENCOUNTER — Ambulatory Visit: Payer: BLUE CROSS/BLUE SHIELD | Admitting: Occupational Therapy

## 2014-12-20 DIAGNOSIS — IMO0002 Reserved for concepts with insufficient information to code with codable children: Secondary | ICD-10-CM

## 2014-12-20 DIAGNOSIS — R2689 Other abnormalities of gait and mobility: Secondary | ICD-10-CM

## 2014-12-20 DIAGNOSIS — I69898 Other sequelae of other cerebrovascular disease: Secondary | ICD-10-CM | POA: Diagnosis not present

## 2014-12-20 DIAGNOSIS — R6889 Other general symptoms and signs: Secondary | ICD-10-CM

## 2014-12-20 DIAGNOSIS — I69319 Unspecified symptoms and signs involving cognitive functions following cerebral infarction: Secondary | ICD-10-CM

## 2014-12-20 DIAGNOSIS — R269 Unspecified abnormalities of gait and mobility: Secondary | ICD-10-CM

## 2014-12-20 NOTE — Therapy (Signed)
[facility] [address] [place], [place], [address] Phone: [phone]   Fax:  [phone]  Patient Details  Name: Martha Clark MRN: [id number] Date of Birth: [birth year] Referring Provider:  No ref. provider found  Encounter Date: 12/20/2014  PHYSICAL THERAPY DISCHARGE SUMMARY  Visits from Start of Care: 3  Current functional level related to goals / functional outcomes:     PT Long Term Goals - 12/20/14 0930    PT LONG TERM GOAL #1   Title demonstrates understanding of HEP / fitness program. (Target Date: 3rd visit)   Baseline MET    Time 2   Period Weeks   Status Achieved   PT LONG TERM GOAL #2   Title Timed Up & Go cognitive increases <40% from her std TUG (Target Date: 3rd visit)   Baseline MET cognitive TUG increased 5.4% from standard   Time 2   Period Weeks   Status Achieved   PT LONG TERM GOAL #3   Title Functional Gait Assessment 28/30 (Target Date: 3rd visit)   Baseline MET 12/20/2014 30/30   Time 2   Period Weeks   Status Achieved       Remaining deficits: See OT & Speech plan   Education / Equipment: Ongoing fitness plan.  Plan: Patient agrees to discharge.  Patient goals were met. Patient is being discharged due to meeting the stated rehab goals.  ?????       Larin Weissberg  PT, DPT  12/20/2014, 9:25 PM  [facility] [address] [place], [place], [address] Phone: [phone]   Fax:  [phone]

## 2014-12-20 NOTE — Therapy (Signed)
[facility] [address] [place], [place], [address] Phone: [phone]   Fax:  [phone]  Occupational Therapy Treatment  Patient Details  Name: Martha Clark MRN: [id number] Date of Birth: 22-Oct-1955 Referring Provider:  Merri Brunette, MD  Encounter Date: 12/20/2014      OT End of Session - 12/20/14 1046    Visit Number 4   Number of Visits 17   OT Start Time 1024   OT Stop Time 1100   OT Time Calculation (min) 36 min   Activity Tolerance Patient tolerated treatment well      Past Medical History  Diagnosis Date  . Atrial fibrillation   . Asthma   . Osteoporosis   . Mitral valve prolapse   . History of CVA (cerebrovascular accident) 11/20/14  . 1St degree AV block   . Typical atrial flutter     Past Surgical History  Procedure Laterality Date  . Tubal ligation    . Mitral valve replacement  2008  . Radiology with anesthesia N/A 11/20/2014    Procedure: RADIOLOGY WITH ANESTHESIA;  Surgeon: Julieanne Cotton, MD;  Location: [facility] OR;  Service: Radiology;  Laterality: N/A;    There were no vitals filed for this visit.  Visit Diagnosis:  Cognitive deficits following cerebral infarction  Decreased functional activity tolerance  Weakness due to cerebrovascular accident      Subjective Assessment - 12/20/14 1043    Subjective  Pt reports that she has been scheduled for ablation surgery the end of this month   Pertinent History MVP s/p bovine mitral valve replacement, A-fib, (see EPIC snap shot for additional info)   Patient Stated Goals return to work   Currently in Pain? No/denies        Treatment: Discussion with pt. regarding upcoming ablation surgery at the end of month. Pt expresses concerns over currently being written out of work only until Aug. 19th. Therapist to in basket Dr. Pearlean Brownie for pt once she knows how long her FMLA can continue. Pt agrees with neuro psych testing prior to return to  work. Therapist to request order when she messages Dr. Pearlean Brownie.  Organizing your day task: problem number 2: Pt performed with 100% accuracy yet required significantly increased time, in a mod distracting environment. Pt reported that she initally  made 2 errors yet was able to self correct once she reviewed her work. Pt's handwriting is very small and nearly illegible, therapist discussed writing larger with pt for increased legibility.                        OT Short Term Goals - 12/17/14 1142    OT SHORT TERM GOAL #1   Title I with HEP.   Baseline 01/07/15   Time 4   Period Weeks   Status New   OT SHORT TERM GOAL #2   Title Pt will increase LUE grip strength  to at least 25 lbs for increased functional use.   Time 4   Period Weeks   Status New   OT SHORT TERM GOAL #3   Title Pt will demonstrate ability to perform a physical and cognitive task simultaneously with 80% or greater acccuracy and no greater than 1 LOB.   Time 4   Period Weeks   Status New   OT SHORT TERM GOAL #4   Title Pt will demonstrate adequate LUE strength and endurance to retrieve 5 lbs weight from overhead shelf x 5  reps without drops.   Time 4   Period Weeks   Status New   OT SHORT TERM GOAL #5   Title Pt will demonstrate ability to organize and complete a mod complex cooking task, demonstrating good safety awareness, modified independently.   Time 4   Period Weeks   Status New   OT SHORT TERM GOAL #6   Title further assess cognition and add additionl goal PRN.   Baseline deferred to ST, OT to address in functional context, Pt may benefit from neuro psych testing prior to return to work   Time 4   Period Weeks   Status Deferred           OT Long Term Goals - 12/09/14 2005    OT LONG TERM GOAL #1   Title Pt will demonstrate LUE grip strength of 30 lbs or greater for increased functional use,   Baseline due 02/06/15   Time 8   Status New   OT LONG TERM GOAL #2   Title Pt will  demonstrate ability to perform a physical and cognitive task simultaneously, with 95%  or better accuracy and no LOB.   Time 8   Period Weeks   Status New   OT LONG TERM GOAL #3   Title Pt will report adequate strength and endurance to perform  home mnagement/ cooking activities in standing for 1 hour prior to rest break.   Time 8   Period Weeks   Status New   OT LONG TERM GOAL #4   Title Pt will demonstrate ability to perfom a moderate complex organization task with 95% or greater accuracy.   Time 8   Period Weeks   Status New               Plan - 12/20/14 1044    Clinical Impression Statement Pt is progressing towards goals. She demonstrates good insight into deficits and can benefit from continued activities to address divided attention in a functional context.   Pt will benefit from skilled therapeutic intervention in order to improve on the following deficits (Retired) Decreased endurance;Decreased activity tolerance;Impaired UE functional use;Decreased cognition;Decreased mobility;Decreased strength   Rehab Potential Good   OT Frequency 2x / week   OT Duration 8 weeks   OT Treatment/Interventions Self-care/ADL training;Fluidtherapy;Moist Heat;DME and/or AE instruction;Patient/family education;Therapeutic exercises;Ultrasound;Therapeutic exercise;Therapeutic activities;Cognitive remediation/compensation;Passive range of motion;Neuromuscular education;Electrical Stimulation;Visual/perceptual remediation/compensation;Cryotherapy;Parrafin   Plan review theraband HEP, divided attention task  or  mod complex cooking to follow instructions   Consulted and Agree with Plan of Care Patient        Problem List Patient Active Problem List   Diagnosis Date Noted  . Coarctation of aorta, recurrent, post-intervention   . Hypokalemia   . 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 Atrial flutter 11/16/2014  . Chest pain on  respiration 10/07/2011  . ASTHMA, UNSPECIFIED, UNSPECIFIED STATUS 05/30/2009  . OTHER OSTEOPOROSIS 05/30/2009  . MITRAL VALVE REPLACEMENT, HX OF 05/30/2009    RINE,KATHRYN 12/20/2014, 10:47 AM Keene Breath, OTR/L [phone] Phone: [phone] 10:47 AM 12/20/2014 [facility] [address] [place], [place], [address] Phone: [phone]   Fax:  [phone]

## 2014-12-20 NOTE — Therapy (Signed)
[facility] [address] [place], [place], [address] Phone: [phone]   Fax:  [phone]  Physical Therapy Treatment  Patient Details  Name: Martha Clark MRN: [id number] Date of Birth: 01-14-1956 Referring Provider:  Deland Pretty, MD  Encounter Date: 12/20/2014      PT End of Session - 12/20/14 0930    Visit Number 3   Number of Visits 3   Date for PT Re-Evaluation 01/07/15   PT Start Time 0930   PT Stop Time 1015   PT Time Calculation (min) 45 min   Equipment Utilized During Treatment Gait belt   Activity Tolerance Patient tolerated treatment well   Behavior During Therapy [facility] for tasks assessed/performed      Past Medical History  Diagnosis Date  . Atrial fibrillation   . Asthma   . Osteoporosis   . Mitral valve prolapse   . History of CVA (cerebrovascular accident) 11/20/14  . 1St degree AV block   . Typical atrial flutter     Past Surgical History  Procedure Laterality Date  . Tubal ligation    . Mitral valve replacement  2008  . Radiology with anesthesia N/A 11/20/2014    Procedure: RADIOLOGY WITH ANESTHESIA;  Surgeon: Luanne Bras, MD;  Location: [facility];  Service: Radiology;  Laterality: N/A;    There were no vitals filed for this visit.  Visit Diagnosis:  Decreased functional activity tolerance  Abnormality of gait  Balance problems      Subjective Assessment - 12/20/14 0934    Subjective No falls or issues. New onset of upper trapezuis pain left (weaker UE) yesterday.    Currently in Pain? Yes   Pain Score 4    Pain Location Shoulder   Pain Orientation Left   Pain Descriptors / Indicators Spasm   Pain Type Acute pain   Pain Onset In the past 7 days   Pain Frequency Intermittent   Aggravating Factors  pushing on it   Pain Relieving Factors sitting   Multiple Pain Sites No            [facility] PT Assessment - 12/20/14 0930    Timed Up and Go Test   Normal TUG  (seconds) 6.77   Cognitive TUG (seconds) 7.14  5.4% increase   Functional Gait  Assessment   Gait Level Surface Walks 20 ft in less than 5.5 sec, no assistive devices, good speed, no evidence for imbalance, normal gait pattern, deviates no more than 6 in outside of the 12 in walkway width.   Change in Gait Speed Able to smoothly change walking speed without loss of balance or gait deviation. Deviate no more than 6 in outside of the 12 in walkway width.   Gait with Horizontal Head Turns Performs head turns smoothly with no change in gait. Deviates no more than 6 in outside 12 in walkway width   Gait with Vertical Head Turns Performs head turns with no change in gait. Deviates no more than 6 in outside 12 in walkway width.   Gait and Pivot Turn Pivot turns safely within 3 sec and stops quickly with no loss of balance.   Step Over Obstacle Is able to step over 2 stacked shoe boxes taped together (9 in total height) without changing gait speed. No evidence of imbalance.   Gait with Narrow Base of Support Is able to ambulate for 10 steps heel to toe with no staggering.   Gait with Eyes Closed Walks 20 ft,  no assistive devices, good speed, no evidence of imbalance, normal gait pattern, deviates no more than 6 in outside 12 in walkway width. Ambulates 20 ft in less than 7 sec.   Ambulating Backwards Walks 20 ft, no assistive devices, good speed, no evidence for imbalance, normal gait   Steps Alternating feet, no rail.   Total Score 30     Gait Training: PT instructed in cognitive tasks while ambulating and using walking sticks for 4 point gait.  Patient ambulated 800' with walking sticks with cues initial 400' then modified independent.  Therapeutic Exercise: PT instructed in proper head and shoulder posture and relationship to her shoulder pain. PT instructed in cervical retraction, scapular retraction, side bend stretch with LUE stabilized on chair bottom.  PT reviewed ongoing HEP including  treadmill at 3.11mh progressing to no UE support first, then increase speed back to 3.642m (prior level) then progress incline back to 5% last. Patient verbalized understanding.                             PT Long Term Goals - 12/20/14 0930    PT LONG TERM GOAL #1   Title demonstrates understanding of HEP / fitness program. (Target Date: 3rd visit)   Baseline MET    Time 2   Period Weeks   Status Achieved   PT LONG TERM GOAL #2   Title Timed Up & Go cognitive increases <40% from her std TUG (Target Date: 3rd visit)   Baseline MET cognitive TUG increased 5.4% from standard   Time 2   Period Weeks   Status Achieved   PT LONG TERM GOAL #3   Title Functional Gait Assessment 28/30 (Target Date: 3rd visit)   Baseline MET 12/20/2014 30/30   Time 2   Period Weeks   Status Achieved               Plan - 12/20/14 0930    Clinical Impression Statement Patient met 3 of 3 LTGs. She needs ongoing OT & Speech, see their treatment plans. Patient appears safe with gait & balance including multi-tasking.    Pt will benefit from skilled therapeutic intervention in order to improve on the following deficits Abnormal gait;Decreased balance;Decreased strength   Rehab Potential Good   PT Frequency 1x / week   PT Duration 2 weeks   PT Treatment/Interventions Functional mobility training;Stair training;Gait training;Therapeutic activities;Balance training;Neuromuscular re-education;Patient/family education   PT Next Visit Plan discharge today   Consulted and Agree with Plan of Care Patient        Problem List Patient Active Problem List   Diagnosis Date Noted  . Coarctation of aorta, recurrent, post-intervention   . Hypokalemia   . 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 Atrial flutter 11/16/2014  . Chest pain on respiration 10/07/2011  . ASTHMA, UNSPECIFIED, UNSPECIFIED STATUS 05/30/2009  . OTHER  OSTEOPOROSIS 05/30/2009  . MITRAL VALVE REPLACEMENT, HX OF 05/30/2009    WAJamey ReasT, DPT 12/20/2014, 9:16 PM  [place]: [phone] Fax:  [phone]

## 2014-12-20 NOTE — Patient Instructions (Signed)
Neck: Retraction   Sit with back straight or lie on firm surface. Place hands, with fingers locked, behind head. Keep head in neutral position. Push back with head while resisting with hands. Do not let head actually move. Hold ____ seconds. Repeat ____ times. Do ____ sessions per day. CAUTION: Pressure should be steady.  Copyright  VHI. All rights reserved.  Neck: Retraction   Sit with back and head straight. Pull chin back to line up ear with shoulder. Do not turn or tilt head. May assist if child cannot keep correct position. Hold ____ seconds. Repeat ____ times. Do ____ sessions per day. CAUTION: Movement should be gentle, steady and slow.  Copyright  VHI. All rights reserved.  Stretch Break - Chin Tuck   Looking straight forward, tuck chin and hold ____ seconds. Relax and return to starting position. Repeat ____ times every ____ hours.  Copyright  VHI. All rights reserved.  Stretch Break - Neck Rotation   Turn head slowly to look over left shoulder. Return to starting position. Then turn to look over right shoulder. Repeat ____ times every ____ hours.  Copyright  VHI. All rights reserved.

## 2014-12-21 ENCOUNTER — Ambulatory Visit: Payer: BLUE CROSS/BLUE SHIELD

## 2014-12-21 ENCOUNTER — Other Ambulatory Visit: Payer: Self-pay

## 2014-12-21 DIAGNOSIS — I69898 Other sequelae of other cerebrovascular disease: Secondary | ICD-10-CM | POA: Diagnosis not present

## 2014-12-21 DIAGNOSIS — R41841 Cognitive communication deficit: Secondary | ICD-10-CM

## 2014-12-21 NOTE — Patient Instructions (Signed)
Divided Attention ideas - any can be done together  Playing a card game either by yourself or with someone  Playing a board game with someone  Sorting playing cards  Writing down commercials on TV or the radio  Writing down news stories on TV or radio  Working outside with yard work, gardening, etc.  Washing dishes  Recite state names and capitals  Complete long division or 2 or 3 digit multiplication problems  Listen to a speech online (i.e., YouTube), or online news (NPR, CNN, etc.), or to a family member give a story, and give pertinent information from the speech, news story, or conversation or story after it is over  Empty the dishwasher  Complete paper/pencil puzzles (sudoku, crosswords, word search)  Complete puzzles online (www.lumosity.com -computer/tablet based, or www.constanttherapy.com - tablet based)  Therapy worksheets/exercises  Put together a puzzle  State coins needed to make a certain change amount  Have someone read a portion of a novel to you, and you give a summary  Call out the total change for combinations of coins   Start at a level that you are challenged by, but also where you can see some success  

## 2014-12-21 NOTE — Patient Outreach (Signed)
[facility]) Care Management  12/21/2014  Martha Clark February 15, 1956 [id number]  Subjective:  Telephone call to patient regarding EMMI stroke transition follow up.  This is week 3 follow up call.  Patient reports she is progressing well.  States yesterday she worked with physical therapy and occupational therapy.  Reports occupational therapist stated she had greatly improved.  States her problem solving skill are improving.  States the physical therapist says her multitasking skill were improving.  States on August 21 th  She is seeing Dr. Ladona Ridgel for an ablation procedure.    Assessment:    Patient reports keeping her appointments with her specialist.  Patient improving with the help from physical therapy and occupational therapy.  Patient is able to verbalize signs and symptoms of stroke.  Patient taking medications as ordered. Appetite is improving.  Patient verbalized agreement to receive follow up call from RN CM with in one week.  Plan:  RN CM will follow up with patient within one week.  Wynonia Lawman, RN, Lowella Dell, [facility] Telephonic Care Coordinator [phone]

## 2014-12-21 NOTE — Therapy (Signed)
[facility] [address] [place], [place], [address] Phone: [phone]   Fax:  [phone]  Speech Language Pathology Treatment  Patient Details  Name: Martha Clark MRN: [id number] Date of Birth: May 08, 1956 Referring Provider:  Merri Brunette, MD  Encounter Date: 12/21/2014      End of Session - 12/21/14 1451    Visit Number 3   Number of Visits 17   Date for SLP Re-Evaluation 02/08/15   Authorization - Number of Visits 54   SLP Start Time 1404   SLP Stop Time  1449   SLP Time Calculation (min) 45 min   Activity Tolerance Patient tolerated treatment well      Past Medical History  Diagnosis Date  . Atrial fibrillation   . Asthma   . Osteoporosis   . Mitral valve prolapse   . History of CVA (cerebrovascular accident) 11/20/14  . 1St degree AV block   . Typical atrial flutter     Past Surgical History  Procedure Laterality Date  . Tubal ligation    . Mitral valve replacement  2008  . Radiology with anesthesia N/A 11/20/2014    Procedure: RADIOLOGY WITH ANESTHESIA;  Surgeon: Julieanne Cotton, MD;  Location: [facility] OR;  Service: Radiology;  Laterality: N/A;    There were no vitals filed for this visit.  Visit Diagnosis: Cognitive communication deficit      Subjective Assessment - 12/21/14 1407    Subjective Pt reports she tried to divide attention with shopping list and conversation with husband. Mostly, she used alternating attention (could have done divided premorbidly)               ADULT SLP TREATMENT - 12/21/14 1409    General Information   Behavior/Cognition Alert;Cooperative;Pleasant mood   Treatment Provided   Treatment provided Cognitive-Linquistic   Pain Assessment   Pain Assessment No/denies pain   Cognitive-Linquistic Treatment   Treatment focused on Cognition   Skilled Treatment Pt consulted SLP regarding cognitive-linguistics in the workplace (half-year reviews with 4 direct  reports).    Assessment / Recommendations / Plan   Plan Continue with current plan of care   Progression Toward Goals   Progression toward goals Progressing toward goals          SLP Education - 12/21/14 1450    Education provided Yes   Education Details divided attention tasks   Person(s) Educated Patient   Methods Explanation;Handout   Comprehension Verbalized understanding          SLP Short Term Goals - 12/21/14 1457    SLP SHORT TERM GOAL #1   Title pt to ID errors in high level cognitive linguistic tasks 100%   Time 3   Period Weeks   Status On-going   SLP SHORT TERM GOAL #2   Title pt will divide attention with 90% success on both tasks over 25 mintues   Time 3   Period Weeks   Status On-going          SLP Long Term Goals - 12/21/14 1457    SLP LONG TERM GOAL #1   Title pt will ID errors in complex cognitive linguistic tasks 100% with self-correction   Time 7   Period Weeks   Status On-going   SLP LONG TERM GOAL #2   Title pt will divide attention in 35 minutes complex cognitive linguistic tasks with 100% success with self-correction   Time 7   Period Weeks   Status On-going  Plan - 12/21/14 1455    Clinical Impression Statement Pt performed written and auditory alternating attention tasks with good success, but divided attention in sipmle-mod complex cognitive lingustic tasks did not occur. Skilled ST remains needed to maximize these skills for return to work.   Speech Therapy Frequency 2x / week   Duration --  7 weeks   Treatment/Interventions SLP instruction and feedback;Compensatory strategies;Patient/family education;Functional tasks;Cueing hierarchy;Cognitive reorganization   Potential to Achieve Goals Good        Problem List Patient Active Problem List   Diagnosis Date Noted  . Coarctation of aorta, recurrent, post-intervention   . Hypokalemia   . 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 Atrial flutter 11/16/2014  . Chest pain on respiration 10/07/2011  . ASTHMA, UNSPECIFIED, UNSPECIFIED STATUS 05/30/2009  . OTHER OSTEOPOROSIS 05/30/2009  . MITRAL VALVE REPLACEMENT, HX OF 05/30/2009    [facility] , MS, CCC-SLP  12/21/2014, 2:58 PM  [facility] [address] [place], [place], [address] Phone: [phone]   Fax:  [phone]

## 2014-12-22 ENCOUNTER — Ambulatory Visit: Payer: BLUE CROSS/BLUE SHIELD | Admitting: Speech Pathology

## 2014-12-22 DIAGNOSIS — R41841 Cognitive communication deficit: Secondary | ICD-10-CM

## 2014-12-22 DIAGNOSIS — I69898 Other sequelae of other cerebrovascular disease: Secondary | ICD-10-CM | POA: Diagnosis not present

## 2014-12-22 NOTE — Therapy (Signed)
[facility] [address] [place], [place], [address] Phone: [phone]   Fax:  [phone]  Speech Language Pathology Treatment  Patient Details  Name: MADYSIN CRISP MRN: [id number] Date of Birth: [birth year] Referring Provider:  Merri Brunette, MD  Encounter Date: 12/22/2014      End of Session - 12/22/14 1500    Visit Number 4   Number of Visits 17   Date for SLP Re-Evaluation 02/08/15   Authorization - Number of Visits 54   SLP Start Time 1230   SLP Stop Time  1315   SLP Time Calculation (min) 45 min   Activity Tolerance Patient tolerated treatment well      Past Medical History  Diagnosis Date  . Atrial fibrillation   . Asthma   . Osteoporosis   . Mitral valve prolapse   . History of CVA (cerebrovascular accident) 11/20/14  . 1St degree AV block   . Typical atrial flutter     Past Surgical History  Procedure Laterality Date  . Tubal ligation    . Mitral valve replacement  2008  . Radiology with anesthesia N/A 11/20/2014    Procedure: RADIOLOGY WITH ANESTHESIA;  Surgeon: Julieanne Cotton, MD;  Location: [facility] OR;  Service: Radiology;  Laterality: N/A;    There were no vitals filed for this visit.  Visit Diagnosis: Cognitive communication deficit      Subjective Assessment - 12/22/14 1239    Subjective "He gave me ideas I could do at home, but I haven't done them since I just got it yesterday"   Currently in Pain? No/denies               ADULT SLP TREATMENT - 12/22/14 1241    General Information   Behavior/Cognition Alert;Cooperative;Pleasant mood   Cognitive-Linquistic Treatment   Treatment focused on Cognition   Skilled Treatment Faciliated divided attention with pt generating chart while participating in conversation and stating bills and coins that add up to a given amount with  need for repetition of amount 3/6 trials. Chart generation completed with extended time and minimal verbal  frustrations "I forgot where I was" and rare min cues. Pt. primarily used alternating attention, homework is to correct chart date and math errors. Pt attended to simple conversation, while sorting cards into 3 piles with a different rule for each pile. She required extended time and occassional lmin cues to attend to each pile. Pt attended to and responded accurately to conversation while sorting with 85% accuracy and extedned pauses in conversation by ST to assist divided attention.    Assessment / Recommendations / Plan   Plan Continue with current plan of care   Progression Toward Goals   Progression toward goals Progressing toward goals          SLP Education - 12/22/14 1317    Education provided Yes   Education Details Divided attention, slow processing compensations.           SLP Short Term Goals - 12/22/14 1500    SLP SHORT TERM GOAL #1   Title pt to ID errors in high level cognitive linguistic tasks 100%   Time 3   Period Weeks   Status On-going   SLP SHORT TERM GOAL #2   Title pt will divide attention with 90% success on both tasks over 25 mintues   Time 3   Period Weeks   Status On-going          SLP Long Term Goals -  12/22/14 1500    SLP LONG TERM GOAL #1   Title pt will ID errors in complex cognitive linguistic tasks 100% with self-correction   Time 7   Period Weeks   Status On-going   SLP LONG TERM GOAL #2   Title pt will divide attention in 35 minutes complex cognitive linguistic tasks with 100% success with self-correction   Time 7   Period Weeks   Status On-going          Plan - 12/22/14 1458    Clinical Impression Statement Pt divided attention in simple to moderately complex cognitive linguistic tasks with min to mod A and extended time. For 2/3 divided attention tasks, pt primarily used alternating attention. Continue skilled ST to amximize attention for return to work.   Speech Therapy Frequency 2x / week   Treatment/Interventions SLP  instruction and feedback;Compensatory strategies;Patient/family education;Functional tasks;Cueing hierarchy;Cognitive reorganization   Potential to Achieve Goals Good        Problem List Patient Active Problem List   Diagnosis Date Noted  . Coarctation of aorta, recurrent, post-intervention   . Hypokalemia   . 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 Atrial flutter 11/16/2014  . Chest pain on respiration 10/07/2011  . ASTHMA, UNSPECIFIED, UNSPECIFIED STATUS 05/30/2009  . OTHER OSTEOPOROSIS 05/30/2009  . MITRAL VALVE REPLACEMENT, HX OF 05/30/2009    Decie Verne, Radene Journey  MS, CCC-SLP  12/22/2014, 3:01 PM  [facility] [address] [place], [place], [address] Phone: [phone]   Fax:  [phone]

## 2014-12-23 ENCOUNTER — Ambulatory Visit: Payer: BLUE CROSS/BLUE SHIELD

## 2014-12-24 ENCOUNTER — Institutional Professional Consult (permissible substitution): Payer: BLUE CROSS/BLUE SHIELD | Admitting: Internal Medicine

## 2014-12-27 ENCOUNTER — Other Ambulatory Visit: Payer: Self-pay

## 2014-12-27 NOTE — Patient Outreach (Signed)
[facility]) Care Management  12/27/2014  MIDGE MOMON 26-Jan-1956 [id number]   12/27/2014   RN CM attempted to reach patient to follow up on EMMI stroke transition call.  Patient was unavailable at the time.  HIPPA compliant voice mail message left.  RN CM will try back at a later date.   Wynonia Lawman, RN, Lowella Dell, [facility] Telephonic Care Coordinator [phone]

## 2014-12-28 ENCOUNTER — Ambulatory Visit: Payer: BLUE CROSS/BLUE SHIELD | Admitting: Speech Pathology

## 2014-12-28 ENCOUNTER — Other Ambulatory Visit: Payer: Self-pay

## 2014-12-28 ENCOUNTER — Ambulatory Visit: Payer: BLUE CROSS/BLUE SHIELD | Admitting: Occupational Therapy

## 2014-12-28 ENCOUNTER — Ambulatory Visit: Payer: BLUE CROSS/BLUE SHIELD | Admitting: Physical Therapy

## 2014-12-28 NOTE — Patient Outreach (Signed)
[facility]) Care Management  12/28/2014  Martha Clark 1956/01/06 [id number]    EMMI Stroke transition final follow up call.  Patient has met all goals noted in care plan.  Patient is continuing with out patient occupational and physical therapy.  Patient reports she has shown much improvement since working with the therapists.   Patient reports she has an appointment with a neuro-psychologist to work on memory deficit.  RN CM will close this case and notify Dr. Leonie Man of case closure.     [name], RN, Ishmael Holter, [place] Telephonic Care Coordinator [phone]

## 2014-12-30 ENCOUNTER — Ambulatory Visit: Payer: BLUE CROSS/BLUE SHIELD | Attending: Internal Medicine | Admitting: Speech Pathology

## 2014-12-30 ENCOUNTER — Ambulatory Visit: Payer: BLUE CROSS/BLUE SHIELD | Admitting: Physical Therapy

## 2014-12-30 ENCOUNTER — Ambulatory Visit: Payer: BLUE CROSS/BLUE SHIELD | Admitting: Occupational Therapy

## 2014-12-30 DIAGNOSIS — I69898 Other sequelae of other cerebrovascular disease: Secondary | ICD-10-CM | POA: Insufficient documentation

## 2014-12-30 DIAGNOSIS — R6889 Other general symptoms and signs: Secondary | ICD-10-CM | POA: Diagnosis present

## 2014-12-30 DIAGNOSIS — I6931 Cognitive deficits following cerebral infarction: Secondary | ICD-10-CM | POA: Diagnosis present

## 2014-12-30 DIAGNOSIS — I69319 Unspecified symptoms and signs involving cognitive functions following cerebral infarction: Secondary | ICD-10-CM

## 2014-12-30 DIAGNOSIS — R531 Weakness: Secondary | ICD-10-CM | POA: Diagnosis present

## 2014-12-30 DIAGNOSIS — R41841 Cognitive communication deficit: Secondary | ICD-10-CM | POA: Insufficient documentation

## 2014-12-30 DIAGNOSIS — IMO0002 Reserved for concepts with insufficient information to code with codable children: Secondary | ICD-10-CM

## 2014-12-30 NOTE — Therapy (Signed)
[facility] [address] [place], [place], [address] Phone: [phone]   Fax:  [phone]  Speech Language Pathology Treatment  Patient Details  Name: Martha Clark MRN: [id number] Date of Birth: [birth year] Referring Provider:  Merri Brunette, MD  Encounter Date: 12/30/2014      End of Session - 12/30/14 1441    Visit Number 5   Number of Visits 17   Date for SLP Re-Evaluation 02/08/15   SLP Start Time 1404   SLP Stop Time  1443   SLP Time Calculation (min) 39 min   Activity Tolerance Patient tolerated treatment well      Past Medical History  Diagnosis Date  . Atrial fibrillation   . Asthma   . Osteoporosis   . Mitral valve prolapse   . History of CVA (cerebrovascular accident) 11/20/14  . 1St degree AV block   . Typical atrial flutter     Past Surgical History  Procedure Laterality Date  . Tubal ligation    . Mitral valve replacement  2008  . Radiology with anesthesia N/A 11/20/2014    Procedure: RADIOLOGY WITH ANESTHESIA;  Surgeon: Julieanne Cotton, MD;  Location: [facility] OR;  Service: Radiology;  Laterality: N/A;    There were no vitals filed for this visit.  Visit Diagnosis: Cognitive communication deficit      Subjective Assessment - 12/30/14 1406    Subjective "My plan is to get to neuropsych early September then go back to work mid September"   Currently in Pain? Yes   Pain Score 3    Pain Location Shoulder   Pain Orientation Left   Pain Descriptors / Indicators Aching   Pain Onset In the past 7 days   Pain Frequency Intermittent   Pain Relieving Factors heat               ADULT SLP TREATMENT - 12/30/14 1408    General Information   Behavior/Cognition Alert;Cooperative;Pleasant mood   Treatment Provided   Treatment provided Cognitive-Linquistic   Cognitive-Linquistic Treatment   Treatment focused on Cognition   Skilled Treatment Facilitate divided attention between sorting  cards into 2 piles while alphabetizing  5-6 letter words with occassional min A to attend to the cards in her hand insteat of mindllessly drawing cards from the pile instead mindlessly drawing from the pile with 90% accuracy on each.  Divided attention between conversation and written task  with occassional  min A  to continue written task during simple conversation. also extended time.   Assessment / Recommendations / Plan   Plan Continue with current plan of care   Progression Toward Goals   Progression toward goals Progressing toward goals          SLP Education - 12/30/14 1435    Education provided Yes   Education Details Compensations for attention   Person(s) Educated Patient   Methods Explanation;Handout   Comprehension Verbalized understanding          SLP Short Term Goals - 12/30/14 1441    SLP SHORT TERM GOAL #1   Title pt to ID errors in high level cognitive linguistic tasks 100%   Time 2   Period Weeks   Status On-going   SLP SHORT TERM GOAL #2   Title pt will divide attention with 90% success on both tasks over 25 mintues   Time 2   Period Weeks   Status On-going          SLP Long Term  Goals - 12/30/14 1441    SLP LONG TERM GOAL #1   Title pt will ID errors in complex cognitive linguistic tasks 100% with self-correction   Time 6   Period Weeks   Status On-going   SLP LONG TERM GOAL #2   Title pt will divide attention in 35 minutes complex cognitive linguistic tasks with 100% success with self-correction   Time 6   Period Weeks   Status On-going          Plan - 12/30/14 1436    Clinical Impression Statement Pt dividing attention between 2 cognitive linguistic activities with min A and extended time. Pt also required min cues for attention to detail. Continue ST to maximize attention for return to work.   Speech Therapy Frequency 2x / week   Treatment/Interventions SLP instruction and feedback;Compensatory strategies;Patient/family  education;Functional tasks;Cueing hierarchy;Cognitive reorganization   Potential to Achieve Goals Good        Problem List Patient Active Problem List   Diagnosis Date Noted  . Coarctation of aorta, recurrent, post-intervention   . Hypokalemia   . 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 Atrial flutter 11/16/2014  . Chest pain on respiration 10/07/2011  . ASTHMA, UNSPECIFIED, UNSPECIFIED STATUS 05/30/2009  . OTHER OSTEOPOROSIS 05/30/2009  . MITRAL VALVE REPLACEMENT, HX OF 05/30/2009    Lovvorn, Radene Journey MS, CCC-SLP 12/30/2014, 2:44 PM  [facility] [address] [place], [place], [address] Phone: [phone]   Fax:  [phone]

## 2014-12-30 NOTE — Therapy (Signed)
[facility] [address] [place], [place], [address] Phone: [phone]   Fax:  [phone]  Occupational Therapy Treatment  Patient Details  Name: Martha Clark MRN: [id number] Date of Birth: Jan 27, 1956 Referring Provider:  Merri Brunette, MD  Encounter Date: 12/30/2014      OT End of Session - 12/30/14 1726    Visit Number 5   Number of Visits 17   Authorization Type BCBS, 60 visit combined, 55 remaining ?at time of eval   OT Start Time 1318   OT Stop Time 1400   OT Time Calculation (min) 42 min   Activity Tolerance Patient tolerated treatment well   Behavior During Therapy WFL for tasks assessed/performed      Past Medical History  Diagnosis Date  . Atrial fibrillation   . Asthma   . Osteoporosis   . Mitral valve prolapse   . History of CVA (cerebrovascular accident) 11/20/14  . 1St degree AV block   . Typical atrial flutter     Past Surgical History  Procedure Laterality Date  . Tubal ligation    . Mitral valve replacement  2008  . Radiology with anesthesia N/A 11/20/2014    Procedure: RADIOLOGY WITH ANESTHESIA;  Surgeon: Julieanne Cotton, MD;  Location: [facility] OR;  Service: Radiology;  Laterality: N/A;    There were no vitals filed for this visit.  Visit Diagnosis:  Cognitive deficits following cerebral infarction  Weakness due to cerebrovascular accident      Subjective Assessment - 12/30/14 1327    Subjective  Pt reports pain in left shoulder and traps   Pertinent History MVP s/p bovine mitral valve replacement, A-fib, (see EPIC snap shot for additional info)   Patient Stated Goals return to work   Currently in Pain? Yes   Pain Score 3    Pain Location Shoulder   Pain Orientation Left   Pain Descriptors / Indicators Aching   Pain Type Acute pain   Pain Onset In the past 7 days   Pain Frequency Intermittent   Aggravating Factors  overuse   Pain Relieving Factors heat    Multiple Pain  Sites No        Treatment: Discussion with pt. Regarding neuro psych testing and return to work. Therapist sent in basket message to MD requesting order.  Moderate level word problems in a mod distracting environment, 3/5 correct, therapsit reviewed with pt. And discussed strategies for organization and importance of slowing down to attend to fine details. Hot pack applied to left shoulder while pt. performed math problems x 10 mins due to pain. No adverse reactions. Pt was instructed to hold off on theraband exercises for 1-2 days as she may overdone it. Ambulating while tossing ball and performing category generation for divided attention, mod difficulty/ v.c.                        OT Short Term Goals - 12/17/14 1142    OT SHORT TERM GOAL #1   Title I with HEP.   Baseline 01/07/15   Time 4   Period Weeks   Status New   OT SHORT TERM GOAL #2   Title Pt will increase LUE grip strength  to at least 25 lbs for increased functional use.   Time 4   Period Weeks   Status New   OT SHORT TERM GOAL #3   Title Pt will demonstrate ability to perform a physical and cognitive task  simultaneously with 80% or greater acccuracy and no greater than 1 LOB.   Time 4   Period Weeks   Status New   OT SHORT TERM GOAL #4   Title Pt will demonstrate adequate LUE strength and endurance to retrieve 5 lbs weight from overhead shelf x 5 reps without drops.   Time 4   Period Weeks   Status New   OT SHORT TERM GOAL #5   Title Pt will demonstrate ability to organize and complete a mod complex cooking task, demonstrating good safety awareness, modified independently.   Time 4   Period Weeks   Status New   OT SHORT TERM GOAL #6   Title further assess cognition and add additionl goal PRN.   Baseline deferred to ST, OT to address in functional context, Pt may benefit from neuro psych testing prior to return to work   Time 4   Period Weeks   Status Deferred           OT Long Term  Goals - 12/09/14 2005    OT LONG TERM GOAL #1   Title Pt will demonstrate LUE grip strength of 30 lbs or greater for increased functional use,   Baseline due 02/06/15   Time 8   Status New   OT LONG TERM GOAL #2   Title Pt will demonstrate ability to perform a physical and cognitive task simultaneously, with 95%  or better accuracy and no LOB.   Time 8   Period Weeks   Status New   OT LONG TERM GOAL #3   Title Pt will report adequate strength and endurance to perform  home mnagement/ cooking activities in standing for 1 hour prior to rest break.   Time 8   Period Weeks   Status New   OT LONG TERM GOAL #4   Title Pt will demonstrate ability to perfom a moderate complex organization task with 95% or greater accuracy.   Time 8   Period Weeks   Status New               Plan - 12/30/14 1728    Clinical Impression Statement Pt is progressing towards goals she agrees with palns to pursue neuropsych testing in Sept. Therapist sent in basket message to Dr. Pearlean Brownie.   Pt will benefit from skilled therapeutic intervention in order to improve on the following deficits (Retired) Decreased endurance;Decreased activity tolerance;Impaired UE functional use;Decreased cognition;Decreased mobility;Decreased strength   Rehab Potential Good   OT Frequency 2x / week   OT Duration 8 weeks   OT Treatment/Interventions Self-care/ADL training;Fluidtherapy;Moist Heat;DME and/or AE instruction;Patient/family education;Therapeutic exercises;Ultrasound;Therapeutic exercise;Therapeutic activities;Cognitive remediation/compensation;Passive range of motion;Neuromuscular education;Electrical Stimulation;Visual/perceptual remediation/compensation;Cryotherapy;Parrafin   Plan check theraband HEP, mod complex cooking   Consulted and Agree with Plan of Care Patient        Problem List Patient Active Problem List   Diagnosis Date Noted  . Coarctation of aorta, recurrent, post-intervention   . Hypokalemia   .  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 Atrial flutter 11/16/2014  . Chest pain on respiration 10/07/2011  . ASTHMA, UNSPECIFIED, UNSPECIFIED STATUS 05/30/2009  . OTHER OSTEOPOROSIS 05/30/2009  . MITRAL VALVE REPLACEMENT, HX OF 05/30/2009    Mical Kicklighter 12/30/2014, 5:31 PM Keene Breath, OTR/L [phone] Phone: [phone] 5:31 PM 12/30/2014 [facility] [address] [place], [place], [address] Phone: [phone]   Fax:  [phone]

## 2014-12-31 ENCOUNTER — Other Ambulatory Visit: Payer: Self-pay | Admitting: Neurology

## 2014-12-31 ENCOUNTER — Ambulatory Visit: Payer: BLUE CROSS/BLUE SHIELD | Admitting: Occupational Therapy

## 2014-12-31 ENCOUNTER — Telehealth: Payer: Self-pay | Admitting: Neurology

## 2014-12-31 DIAGNOSIS — I639 Cerebral infarction, unspecified: Secondary | ICD-10-CM

## 2014-12-31 DIAGNOSIS — R6889 Other general symptoms and signs: Secondary | ICD-10-CM

## 2014-12-31 DIAGNOSIS — R41841 Cognitive communication deficit: Secondary | ICD-10-CM | POA: Diagnosis not present

## 2014-12-31 DIAGNOSIS — IMO0002 Reserved for concepts with insufficient information to code with codable children: Secondary | ICD-10-CM

## 2014-12-31 DIAGNOSIS — I69319 Unspecified symptoms and signs involving cognitive functions following cerebral infarction: Secondary | ICD-10-CM

## 2014-12-31 NOTE — Therapy (Signed)
[facility] [address] [place], [place], [address] Phone: [phone]   Fax:  [phone]  Occupational Therapy Treatment  Patient Details  Name: Martha Clark MRN: [id number] Date of Birth: [birth year] Referring Provider:  Merri Brunette, MD  Encounter Date: 12/31/2014      OT End of Session - 12/31/14 1538    Visit Number 6   Number of Visits 17   Authorization Type BCBS, 60 visit combined, 55 remaining ?at time of eval   OT Start Time 1445   OT Stop Time 1530   OT Time Calculation (min) 45 min   Activity Tolerance Patient tolerated treatment well   Behavior During Therapy [place] for tasks assessed/performed      Past Medical History  Diagnosis Date  . Atrial fibrillation   . Asthma   . Osteoporosis   . Mitral valve prolapse   . History of CVA (cerebrovascular accident) 11/20/14  . 1St degree AV block   . Typical atrial flutter     Past Surgical History  Procedure Laterality Date  . Tubal ligation    . Mitral valve replacement  2008  . Radiology with anesthesia N/A 11/20/2014    Procedure: RADIOLOGY WITH ANESTHESIA;  Surgeon: Julieanne Cotton, MD;  Location: [facility] OR;  Service: Radiology;  Laterality: N/A;    There were no vitals filed for this visit.  Visit Diagnosis:  Cognitive deficits following cerebral infarction  Weakness due to cerebrovascular accident  Decreased functional activity tolerance      Subjective Assessment - 12/31/14 1447    Pertinent History MVP s/p bovine mitral valve replacement, A-fib, (see EPIC snap shot for additional info)   Patient Stated Goals return to work   Currently in Pain? Yes                      OT Treatments/Exercises (OP) - 12/31/14 0001    Cognitive Exercises   Attention Span Divided Physical and cognitive task, amb while carrying on conversation with only occasional difficulty, mod v.c. for amb while category generation.   Exercises   Exercises Shoulder;Elbow   Shoulder Exercises: Standing   Horizontal ABduction AROM;Both;15 reps  red band   Flexion AROM;15 reps;Left;Theraband  red min v.c.   Extension AROM;15 reps;Theraband;Left  red, min v.c.   Elbow Exercises   Theraband Level (Elbow Flexion) Level 2 (Red)  biceps curls, then triceps ext red, 15 reps   Elbow Extension AROM;15 reps;Left   Additional Elbow Exercises   UBE (Upper Arm Bike) 6 mins level 5 for conditioning   Hand Exercises   Other Hand Exercises Red putty exercises for sustained grip followed by gripper set at 25 lbs to pick up 1/2 the 1 inch blocks, 2 rest breaks due to fatigue/ mod drops.      Martha Clark spoke with Dr. Pearlean Brownie and he is going to refer Martha Clark for neuro psych and is going to consider extending time out of work. Therapist discussed some basic strategies Martha Clark may use for at work due to [employer]. divided attention.            OT Short Term Goals - 12/31/14 1519    OT SHORT TERM GOAL #1   Title I with HEP.   Status On-going   OT SHORT TERM GOAL #2   Title Martha Clark will increase LUE grip strength  to at least 25 lbs for increased functional use.   Baseline LUE 32 lbs   Status Achieved  OT SHORT TERM GOAL #3   Title Martha Clark will demonstrate ability to perform a physical and cognitive task simultaneously with 80% or greater acccuracy and no greater than 1 LOB.   Status On-going   OT SHORT TERM GOAL #4   Title Martha Clark will demonstrate adequate LUE strength and endurance to retrieve 5 lbs weight from overhead shelf x 5 reps without drops.   Status On-going   OT SHORT TERM GOAL #5   Title Martha Clark will demonstrate ability to organize and complete a mod complex cooking task, demonstrating good safety awareness, modified independently.   Status On-going           OT Long Term Goals - 12/09/14 2005    OT LONG TERM GOAL #1   Title Martha Clark will demonstrate LUE grip strength of 30 lbs or greater for increased functional use,   Baseline due 02/06/15   Time 8   Status New    OT LONG TERM GOAL #2   Title Martha Clark will demonstrate ability to perform a physical and cognitive task simultaneously, with 95%  or better accuracy and no LOB.   Time 8   Period Weeks   Status New   OT LONG TERM GOAL #3   Title Martha Clark will report adequate strength and endurance to perform  home mnagement/ cooking activities in standing for 1 hour prior to rest break.   Time 8   Period Weeks   Status New   OT LONG TERM GOAL #4   Title Martha Clark will demonstrate ability to perfom a moderate complex organization task with 95% or greater accuracy.   Time 8   Period Weeks   Status New               Plan - 12/31/14 1523    Clinical Impression Statement lPt is progressing towards goals. she demonstratrates good awareness of decreased divided attention. Martha Clark demonstrates increased LUE grip strength.   Martha Clark will benefit from skilled therapeutic intervention in order to improve on the following deficits (Retired) Decreased endurance;Decreased activity tolerance;Impaired UE functional use;Decreased cognition;Decreased mobility;Decreased strength   Rehab Potential Good   OT Frequency 2x / week   OT Duration 8 weeks   OT Treatment/Interventions Self-care/ADL training;Fluidtherapy;Moist Heat;DME and/or AE instruction;Patient/family education;Therapeutic exercises;Ultrasound;Therapeutic exercise;Therapeutic activities;Cognitive remediation/compensation;Passive range of motion;Neuromuscular education;Electrical Stimulation;Visual/perceptual remediation/compensation;Cryotherapy;Parrafin   Plan mod complex cooking/ organization   Consulted and Agree with Plan of Care Patient        Problem List Patient Active Problem List   Diagnosis Date Noted  . Coarctation of aorta, recurrent, post-intervention   . Hypokalemia   . 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 Atrial flutter 11/16/2014  . Chest pain on respiration 10/07/2011  . ASTHMA, UNSPECIFIED,  UNSPECIFIED STATUS 05/30/2009  . OTHER OSTEOPOROSIS 05/30/2009  . MITRAL VALVE REPLACEMENT, HX OF 05/30/2009    RINE,KATHRYN 12/31/2014, 3:52 PM Keene Breath, OTR/L [phone] Phone: [phone] 3:52 PM 12/31/2014 [facility] [address] [place], [place], [address] Phone: [phone]   Fax:  [phone]

## 2014-12-31 NOTE — Telephone Encounter (Signed)
I got email message from occupational therapist stating having noticed cognitive difficulties with the patient and concern about returning back to work. I called and spoke to the patient's husband as well as patient herself who confirmed this hence I recommend referral to for detailed neuropsych testing to Dr. Leonides Cave and the patient to stay out of work until September 19. She was advised to keep her follow-up appointment with me on September 3.

## 2015-01-03 NOTE — Patient Outreach (Signed)
Martha Clark) Care Management  12/28/2014  Martha Clark Oct 01, 1955 [id number]   Notification from [name], RN to close case due to goal met with [name].  Thanks, [name]. [place], [place] Assistant Phone: [phone] Fax: [phone]

## 2015-01-04 ENCOUNTER — Ambulatory Visit: Payer: BLUE CROSS/BLUE SHIELD | Admitting: Physical Therapy

## 2015-01-04 ENCOUNTER — Ambulatory Visit: Payer: BLUE CROSS/BLUE SHIELD

## 2015-01-04 ENCOUNTER — Telehealth: Payer: Self-pay | Admitting: Neurology

## 2015-01-04 ENCOUNTER — Ambulatory Visit: Payer: BLUE CROSS/BLUE SHIELD | Admitting: Occupational Therapy

## 2015-01-04 VITALS — BP 95/68

## 2015-01-04 DIAGNOSIS — IMO0002 Reserved for concepts with insufficient information to code with codable children: Secondary | ICD-10-CM

## 2015-01-04 DIAGNOSIS — I69319 Unspecified symptoms and signs involving cognitive functions following cerebral infarction: Secondary | ICD-10-CM

## 2015-01-04 DIAGNOSIS — R6889 Other general symptoms and signs: Secondary | ICD-10-CM

## 2015-01-04 DIAGNOSIS — R41841 Cognitive communication deficit: Secondary | ICD-10-CM | POA: Diagnosis not present

## 2015-01-04 NOTE — Telephone Encounter (Signed)
[employer] is asking Dr. Leonie Man to send a form or email when she is going to be out of work and why she is being out work, since she's extending the date until from August 19 to September 19. The fax number [phone] and her claim number is [id number]. The best number to contact her is [phone]

## 2015-01-04 NOTE — Therapy (Signed)
[facility] [address] [place], [place], [address] Phone: [phone]   Fax:  [phone]  Speech Language Pathology Treatment  Patient Details  Name: Martha Clark MRN: [id number] Date of Birth: 05/07/1956 Referring Provider:  Merri Brunette, MD  Encounter Date: 01/04/2015      End of Session - 01/04/15 1129    Visit Number 6   Number of Visits 17   Date for SLP Re-Evaluation 02/08/15   Authorization - Visit Number 5   Authorization - Number of Visits 50   SLP Start Time 0935   SLP Stop Time  1020   SLP Time Calculation (min) 45 min   Activity Tolerance Patient tolerated treatment well      Past Medical History  Diagnosis Date  . Atrial fibrillation   . Asthma   . Osteoporosis   . Mitral valve prolapse   . History of CVA (cerebrovascular accident) 11/20/14  . 1St degree AV block   . Typical atrial flutter     Past Surgical History  Procedure Laterality Date  . Tubal ligation    . Mitral valve replacement  2008  . Radiology with anesthesia N/A 11/20/2014    Procedure: RADIOLOGY WITH ANESTHESIA;  Surgeon: Julieanne Cotton, MD;  Location: [facility] OR;  Service: Radiology;  Laterality: N/A;    There were no vitals filed for this visit.  Visit Diagnosis: Cognitive communication deficit      Subjective Assessment - 01/04/15 0944    Subjective "This was the first week I thought about going back to work." However pt has not done much more due to wanting to focus on rehab.               ADULT SLP TREATMENT - 01/04/15 0947    General Information   Behavior/Cognition Alert;Cooperative;Pleasant mood   Treatment Provided   Treatment provided Cognitive-Linquistic   Pain Assessment   Pain Assessment No/denies pain   Cognitive-Linquistic Treatment   Treatment focused on Cognition   Skilled Treatment SLP educated pt regarding divided attention tasks at home; complexity and pairing between tasks. SLP  engaged pt with divided attention tasks today between written and auditory tasks - min cues rarely needed for remaining on task.   Assessment / Recommendations / Plan   Plan Continue with current plan of care   Progression Toward Goals   Progression toward goals Progressing toward goals            SLP Short Term Goals - 01/04/15 1131    SLP SHORT TERM GOAL #1   Title pt to ID errors in high level cognitive linguistic tasks 100%   Time 1   Status Achieved   SLP SHORT TERM GOAL #2   Title pt will divide attention with 90% success on both tasks over 25 mintues   Time 1   Period Weeks   Status On-going          SLP Long Term Goals - 01/04/15 1132    SLP LONG TERM GOAL #1   Title pt will ID errors in complex cognitive linguistic tasks 100% with self-correction   Time 5   Period Weeks   Status On-going   SLP LONG TERM GOAL #2   Title pt will divide attention in 35 minutes complex cognitive linguistic tasks with 100% success with self-correction   Time 5   Period Weeks   Status On-going          Plan - 01/04/15 1130  Clinical Impression Statement Min A needed to keep pt attention on task in divided atteniton task - pt's ability appears to have improved since last time this SLP treated pt. Skilled ST to cont to maximize pt ability in higher level cognitive-lingustic skills for ultimate return to work.   Speech Therapy Frequency 2x / week   Duration --  5 weeks   Treatment/Interventions SLP instruction and feedback;Compensatory strategies;Patient/family education;Functional tasks;Cueing hierarchy;Cognitive reorganization   Potential to Achieve Goals Good        Problem List Patient Active Problem List   Diagnosis Date Noted  . Coarctation of aorta, recurrent, post-intervention   . Hypokalemia   . 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 Atrial flutter 11/16/2014  . Chest pain on respiration 10/07/2011  .  ASTHMA, UNSPECIFIED, UNSPECIFIED STATUS 05/30/2009  . OTHER OSTEOPOROSIS 05/30/2009  . MITRAL VALVE REPLACEMENT, HX OF 05/30/2009    [facility] , MS, CCC-SLP  01/04/2015, 11:33 AM  [facility] [address] [place], [place], [address] Phone: [phone]   Fax:  [phone]

## 2015-01-04 NOTE — Patient Instructions (Signed)
Continue practicing divided attention tasks at home. You are doing very well choosing your tasks thus far!

## 2015-01-05 NOTE — Therapy (Signed)
[facility] [address] [place], [place], [address] Phone: [phone]   Fax:  [phone]  Occupational Therapy Treatment  Patient Details  Name: Martha Clark MRN: [id number] Date of Birth: [birth year] Referring Provider:  Merri Brunette, MD  Encounter Date: 01/04/2015      OT End of Session - 01/05/15 1733    Visit Number 7   Number of Visits 17   Authorization Type BC[employer], 60 visit combined, 55 remaining ?at time of eval   OT Start Time 1109   OT Stop Time 1147   OT Time Calculation (min) 38 min   Activity Tolerance Patient tolerated treatment well   Behavior During Therapy WFL for tasks assessed/performed      Past Medical History  Diagnosis Date  . Atrial fibrillation   . Asthma   . Osteoporosis   . Mitral valve prolapse   . History of CVA (cerebrovascular accident) 11/20/14  . 1St degree AV block   . Typical atrial flutter     Past Surgical History  Procedure Laterality Date  . Tubal ligation    . Mitral valve replacement  2008  . Radiology with anesthesia N/A 11/20/2014    Procedure: RADIOLOGY WITH ANESTHESIA;  Surgeon: Julieanne Cotton, MD;  Location: [facility] OR;  Service: Radiology;  Laterality: N/A;    Filed Vitals:   01/04/15 1138  BP: 95/68    Visit Diagnosis:  Cognitive deficits following cerebral infarction  Weakness due to cerebrovascular accident  Decreased functional activity tolerance    Treatment: self care: divided attention for cooking task, 2 stovetop items. Pt demonstrated good safety awareness, and only made 1 error placing pan on wrong burner, to fry her egg. Pt realized and self corrected. Pt monitored both items and correctly followed instructions on package rice mix. Theraputic ex; Arm bike x 5 mins level 1 for conditioning. Therapist spoke with pt by telephone. She would like to return to work 1/2 days, therapist encouraged pt to discuss with  MD.                          OT Short Term Goals - 12/31/14 1519    OT SHORT TERM GOAL #1   Title I with HEP.   Status On-going   OT SHORT TERM GOAL #2   Title Pt will increase LUE grip strength  to at least 25 lbs for increased functional use.   Baseline LUE 32 lbs   Status Achieved   OT SHORT TERM GOAL #3   Title Pt will demonstrate ability to perform a physical and cognitive task simultaneously with 80% or greater acccuracy and no greater than 1 LOB.   Status On-going   OT SHORT TERM GOAL #4   Title Pt will demonstrate adequate LUE strength and endurance to retrieve 5 lbs weight from overhead shelf x 5 reps without drops.   Status On-going   OT SHORT TERM GOAL #5   Title Pt will demonstrate ability to organize and complete a mod complex cooking task, demonstrating good safety awareness, modified independently.   Status On-going           OT Long Term Goals - 12/09/14 2005    OT LONG TERM GOAL #1   Title Pt will demonstrate LUE grip strength of 30 lbs or greater for increased functional use,   Baseline due 02/06/15   Time 8   Status New   OT LONG TERM GOAL #2  Title Pt will demonstrate ability to perform a physical and cognitive task simultaneously, with 95%  or better accuracy and no LOB.   Time 8   Period Weeks   Status New   OT LONG TERM GOAL #3   Title Pt will report adequate strength and endurance to perform  home mnagement/ cooking activities in standing for 1 hour prior to rest break.   Time 8   Period Weeks   Status New   OT LONG TERM GOAL #4   Title Pt will demonstrate ability to perfom a moderate complex organization task with 95% or greater accuracy.   Time 8   Period Weeks   Status New               Plan - 01/05/15 1731    Clinical Impression Statement Pt is progressing towards goals. She perfomed well on divided attention task for cooking today.   Pt will benefit from skilled therapeutic intervention in order to improve on  the following deficits (Retired) Decreased endurance;Decreased activity tolerance;Impaired UE functional use;Decreased cognition;Decreased mobility;Decreased strength   Rehab Potential Good   OT Frequency 2x / week   OT Duration 8 weeks   OT Treatment/Interventions Self-care/ADL training;Fluidtherapy;Moist Heat;DME and/or AE instruction;Patient/family education;Therapeutic exercises;Ultrasound;Therapeutic exercise;Therapeutic activities;Cognitive remediation/compensation;Passive range of motion;Neuromuscular education;Electrical Stimulation;Visual/perceptual remediation/compensation;Cryotherapy;Parrafin   Plan organization, strength, endurance   Consulted and Agree with Plan of Care Patient        Problem List Patient Active Problem List   Diagnosis Date Noted  . Coarctation of aorta, recurrent, post-intervention   . Hypokalemia   . 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 Atrial flutter 11/16/2014  . Chest pain on respiration 10/07/2011  . ASTHMA, UNSPECIFIED, UNSPECIFIED STATUS 05/30/2009  . OTHER OSTEOPOROSIS 05/30/2009  . MITRAL VALVE REPLACEMENT, HX OF 05/30/2009    RINE,KATHRYN 01/05/2015, 5:34 PM Keene Breath, OTR/L [phone] Phone: [phone] 5:34 PM 01/05/2015 [facility] [address] [place], [place], [address] Phone: [phone]   Fax:  [phone]

## 2015-01-05 NOTE — Telephone Encounter (Signed)
Kindly state in note that patient is having cognitive difficulties and fatigue following her stroke which are preventing her from returning to work until the stated date

## 2015-01-06 ENCOUNTER — Ambulatory Visit: Payer: BLUE CROSS/BLUE SHIELD | Admitting: Speech Pathology

## 2015-01-06 ENCOUNTER — Ambulatory Visit: Payer: BLUE CROSS/BLUE SHIELD | Admitting: Occupational Therapy

## 2015-01-06 DIAGNOSIS — IMO0002 Reserved for concepts with insufficient information to code with codable children: Secondary | ICD-10-CM

## 2015-01-06 DIAGNOSIS — R41841 Cognitive communication deficit: Secondary | ICD-10-CM | POA: Diagnosis not present

## 2015-01-06 DIAGNOSIS — R6889 Other general symptoms and signs: Secondary | ICD-10-CM

## 2015-01-06 DIAGNOSIS — I69319 Unspecified symptoms and signs involving cognitive functions following cerebral infarction: Secondary | ICD-10-CM

## 2015-01-06 NOTE — Therapy (Signed)
[facility] [address] [place], [place], [address] Phone: [phone]   Fax:  [phone]  Speech Language Pathology Treatment  Patient Details  Name: Martha Clark MRN: [id number] Date of Birth: 1955/08/18 Referring Provider:  Merri Brunette, MD  Encounter Date: 01/06/2015      End of Session - 01/06/15 1055    Visit Number 7   Number of Visits 17   Date for SLP Re-Evaluation 02/08/15   SLP Start Time 1017   SLP Stop Time  1057   SLP Time Calculation (min) 40 min   Activity Tolerance Patient tolerated treatment well      Past Medical History  Diagnosis Date  . Atrial fibrillation   . Asthma   . Osteoporosis   . Mitral valve prolapse   . History of CVA (cerebrovascular accident) 11/20/14  . 1St degree AV block   . Typical atrial flutter     Past Surgical History  Procedure Laterality Date  . Tubal ligation    . Mitral valve replacement  2008  . Radiology with anesthesia N/A 11/20/2014    Procedure: RADIOLOGY WITH ANESTHESIA;  Surgeon: Julieanne Cotton, MD;  Location: [facility] OR;  Service: Radiology;  Laterality: N/A;    There were no vitals filed for this visit.  Visit Diagnosis: Cognitive communication deficit      Subjective Assessment - 01/06/15 1021    Subjective "I'm not confident driving yet"    Currently in Pain? No/denies               ADULT SLP TREATMENT - 01/06/15 1024    General Information   Behavior/Cognition Alert;Cooperative;Pleasant mood   Treatment Provided   Treatment provided Cognitive-Linquistic   Cognitive-Linquistic Treatment   Treatment focused on Cognition   Skilled Treatment Reviewed homework with pt - she reported conversing with family, watching TV show and doing linguisit task with some fatigue. Facilitated divided attention with simple math task and watching SLP flip cards, adding cards if a black is followed by red with occasional cues to attend to card flips  while performing simple multiplication.  Pt became tearful as she had difficulty with simple math.  Pt attended to moderately complex conversation and directed therapist on where to put card while filling out checkbook and finding balances with rare min A  and 90% on each.    Assessment / Recommendations / Plan   Plan Continue with current plan of care   Progression Toward Goals   Progression toward goals Progressing toward goals            SLP Short Term Goals - 01/06/15 1054    SLP SHORT TERM GOAL #1   Title pt to ID errors in high level cognitive linguistic tasks 100%   Time 1   Status Achieved   SLP SHORT TERM GOAL #2   Title pt will divide attention with 90% success on both tasks over 25 mintues   Time 1   Period Weeks   Status On-going          SLP Long Term Goals - 01/06/15 1054    SLP LONG TERM GOAL #1   Title pt will ID errors in complex cognitive linguistic tasks 100% with self-correction   Time 5   Period Weeks   Status On-going   SLP LONG TERM GOAL #2   Title pt will divide attention in 35 minutes complex cognitive linguistic tasks with 100% success with self-correction   Time 5  Period Weeks   Status On-going          Problem List Patient Active Problem List   Diagnosis Date Noted  . Coarctation of aorta, recurrent, post-intervention   . Hypokalemia   . 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 Atrial flutter 11/16/2014  . Chest pain on respiration 10/07/2011  . ASTHMA, UNSPECIFIED, UNSPECIFIED STATUS 05/30/2009  . OTHER OSTEOPOROSIS 05/30/2009  . MITRAL VALVE REPLACEMENT, HX OF 05/30/2009    Lovvorn, Radene Journey  MS, CCC-SLP  01/06/2015, 10:58 AM  [facility] [address] [place], [place], [address] Phone: [phone]   Fax:  [phone]

## 2015-01-06 NOTE — Therapy (Signed)
[facility] [address] [place], [place], [address] Phone: [phone]   Fax:  [phone]  Occupational Therapy Treatment  Patient Details  Name: Martha Clark MRN: [id number] Date of Birth: 08/08/1955 Referring Provider:  Merri Brunette, MD  Encounter Date: 01/06/2015      OT End of Session - 01/06/15 1217    Visit Number 8   Number of Visits 17   Authorization Type BCBS, 60 visit combined, 55 remaining ?at time of eval   OT Start Time [phone]   OT Stop Time 1015   OT Time Calculation (min) 39 min   Activity Tolerance Patient tolerated treatment well   Behavior During Therapy [facility]: Lambert Rhonda W for tasks assessed/performed      Past Medical History  Diagnosis Date  . Atrial fibrillation   . Asthma   . Osteoporosis   . Mitral valve prolapse   . History of CVA (cerebrovascular accident) 11/20/14  . 1St degree AV block   . Typical atrial flutter     Past Surgical History  Procedure Laterality Date  . Tubal ligation    . Mitral valve replacement  2008  . Radiology with anesthesia N/A 11/20/2014    Procedure: RADIOLOGY WITH ANESTHESIA;  Surgeon: Julieanne Cotton, MD;  Location: [facility] OR;  Service: Radiology;  Laterality: N/A;    There were no vitals filed for this visit.  Visit Diagnosis:  Cognitive deficits following cerebral infarction  Weakness due to cerebrovascular accident  Decreased functional activity tolerance       Treatment: Self care: complex multi step, organizational word problems, Pt made an error on first problem by omission important information.  1 v.c.required for correct completion of second problem and then pt was able to complete correctly. Discussed importance of slowing down,writing out steps  and attention to detail during cognitive tasks. Arm bike x 5 mins level1 for conditioning.                       OT Short Term Goals - 12/31/14 1519    OT SHORT TERM GOAL #1   Title  I with HEP.   Status On-going   OT SHORT TERM GOAL #2   Title Pt will increase LUE grip strength  to at least 25 lbs for increased functional use.   Baseline LUE 32 lbs   Status Achieved   OT SHORT TERM GOAL #3   Title Pt will demonstrate ability to perform a physical and cognitive task simultaneously with 80% or greater acccuracy and no greater than 1 LOB.   Status On-going   OT SHORT TERM GOAL #4   Title Pt will demonstrate adequate LUE strength and endurance to retrieve 5 lbs weight from overhead shelf x 5 reps without drops.   Status On-going   OT SHORT TERM GOAL #5   Title Pt will demonstrate ability to organize and complete a mod complex cooking task, demonstrating good safety awareness, modified independently.   Status On-going           OT Long Term Goals - 12/09/14 2005    OT LONG TERM GOAL #1   Title Pt will demonstrate LUE grip strength of 30 lbs or greater for increased functional use,   Baseline due 02/06/15   Time 8   Status New   OT LONG TERM GOAL #2   Title Pt will demonstrate ability to perform a physical and cognitive task simultaneously, with 95%  or better accuracy and no LOB.  Time 8   Period Weeks   Status New   OT LONG TERM GOAL #3   Title Pt will report adequate strength and endurance to perform  home mnagement/ cooking activities in standing for 1 hour prior to rest break.   Time 8   Period Weeks   Status New   OT LONG TERM GOAL #4   Title Pt will demonstrate ability to perfom a moderate complex organization task with 95% or greater accuracy.   Time 8   Period Weeks   Status New               Plan - 01/06/15 1701    Clinical Impression Statement Pt is progressing towards goals yet she hurries through organization tasks and misses fine details as a result.   Pt will benefit from skilled therapeutic intervention in order to improve on the following deficits (Retired) Decreased endurance;Decreased activity tolerance;Impaired UE functional  use;Decreased cognition;Decreased mobility;Decreased strength   Rehab Potential Good   OT Frequency 2x / week   OT Duration 8 weeks   Plan continue organization, divided attention   Consulted and Agree with Plan of Care Patient        Problem List Patient Active Problem List   Diagnosis Date Noted  . Coarctation of aorta, recurrent, post-intervention   . Hypokalemia   . 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 Atrial flutter 11/16/2014  . Chest pain on respiration 10/07/2011  . ASTHMA, UNSPECIFIED, UNSPECIFIED STATUS 05/30/2009  . OTHER OSTEOPOROSIS 05/30/2009  . MITRAL VALVE REPLACEMENT, HX OF 05/30/2009    Sheika Coutts 01/06/2015, 5:04 PM Keene Breath, OTR/L [phone] Phone: [phone] 5:04 PM 01/06/2015 [facility] [address] [place], [place], [address] Phone: [phone]   Fax:  [phone]

## 2015-01-06 NOTE — Telephone Encounter (Signed)
Patient stopped by the office to check the status of her previous message.  Would like a call once this has been taken care of please. [phone].

## 2015-01-06 NOTE — Telephone Encounter (Signed)
Lt vm for patient. Her note has been written, but Dr.Sethi has to sign the note.Thats all Im waiting on if she calls back, or comes to the office.

## 2015-01-10 ENCOUNTER — Other Ambulatory Visit (INDEPENDENT_AMBULATORY_CARE_PROVIDER_SITE_OTHER): Payer: BLUE CROSS/BLUE SHIELD | Admitting: *Deleted

## 2015-01-10 DIAGNOSIS — I4892 Unspecified atrial flutter: Secondary | ICD-10-CM

## 2015-01-10 LAB — CBC WITH DIFFERENTIAL/PLATELET
Basophils Absolute: 0 10*3/uL (ref 0.0–0.1)
Basophils Relative: 0.6 % (ref 0.0–3.0)
Eosinophils Absolute: 0.1 10*3/uL (ref 0.0–0.7)
Eosinophils Relative: 1.5 % (ref 0.0–5.0)
HCT: 47.5 % — ABNORMAL HIGH (ref 36.0–46.0)
Hemoglobin: 15.9 g/dL — ABNORMAL HIGH (ref 12.0–15.0)
Lymphocytes Relative: 40.7 % (ref 12.0–46.0)
Lymphs Abs: 2.8 10*3/uL (ref 0.7–4.0)
MCHC: 33.5 g/dL (ref 30.0–36.0)
MCV: 84.6 fl (ref 78.0–100.0)
Monocytes Absolute: 0.5 10*3/uL (ref 0.1–1.0)
Monocytes Relative: 7.3 % (ref 3.0–12.0)
Neutro Abs: 3.5 10*3/uL (ref 1.4–7.7)
Neutrophils Relative %: 49.9 % (ref 43.0–77.0)
Platelets: 168 10*3/uL (ref 150.0–400.0)
RBC: 5.61 Mil/uL — ABNORMAL HIGH (ref 3.87–5.11)
RDW: 13.5 % (ref 11.5–15.5)
WBC: 7 10*3/uL (ref 4.0–10.5)

## 2015-01-10 LAB — BASIC METABOLIC PANEL
BUN: 21 mg/dL (ref 6–23)
CO2: 32 mEq/L (ref 19–32)
Calcium: 9.4 mg/dL (ref 8.4–10.5)
Chloride: 105 mEq/L (ref 96–112)
Creatinine, Ser: 0.86 mg/dL (ref 0.40–1.20)
GFR: 71.59 mL/min (ref >60.00)
Glucose, Bld: 102 mg/dL — ABNORMAL HIGH (ref 70–99)
Potassium: 4.6 mEq/L (ref 3.5–5.1)
Sodium: 142 mEq/L (ref 135–145)

## 2015-01-10 NOTE — Telephone Encounter (Signed)
Talk to Martha Clark about her letter for her being out of work till February 14, 2015. Rn stated to patient the letter will be at the front desk. Pt stated she will pick up letter on January 11, 2015.

## 2015-01-11 ENCOUNTER — Ambulatory Visit: Payer: BLUE CROSS/BLUE SHIELD | Admitting: Occupational Therapy

## 2015-01-11 ENCOUNTER — Ambulatory Visit: Payer: BLUE CROSS/BLUE SHIELD

## 2015-01-11 DIAGNOSIS — I69319 Unspecified symptoms and signs involving cognitive functions following cerebral infarction: Secondary | ICD-10-CM

## 2015-01-11 DIAGNOSIS — R41841 Cognitive communication deficit: Secondary | ICD-10-CM | POA: Diagnosis not present

## 2015-01-11 NOTE — Therapy (Signed)
[facility] [address] [place], [place], [address] Phone: [phone]   Fax:  [phone]  Speech Language Pathology Treatment  Patient Details  Name: Martha Clark MRN: [id number] Date of Birth: 1956/05/21 Referring Provider:  Merri Brunette, MD  Encounter Date: 01/11/2015      End of Session - 01/11/15 1106    Visit Number 8   Number of Visits 17   Date for SLP Re-Evaluation 02/08/15   Authorization - Visit Number 7   Authorization - Number of Visits 50   SLP Start Time 1019   SLP Stop Time  1104   SLP Time Calculation (min) 45 min   Activity Tolerance Patient tolerated treatment well      Past Medical History  Diagnosis Date  . Atrial fibrillation   . Asthma   . Osteoporosis   . Mitral valve prolapse   . History of CVA (cerebrovascular accident) 11/20/14  . 1St degree AV block   . Typical atrial flutter     Past Surgical History  Procedure Laterality Date  . Tubal ligation    . Mitral valve replacement  2008  . Radiology with anesthesia N/A 11/20/2014    Procedure: RADIOLOGY WITH ANESTHESIA;  Surgeon: Julieanne Cotton, MD;  Location: [facility] OR;  Service: Radiology;  Laterality: N/A;    There were no vitals filed for this visit.  Visit Diagnosis: Cognitive deficits following cerebral infarction      Subjective Assessment - 01/11/15 1031    Subjective "I decided I needed to make some changes at work when I go back."               ADULT SLP TREATMENT - 01/11/15 1031    General Information   Behavior/Cognition Alert;Cooperative;Pleasant mood   Treatment Provided   Treatment provided Cognitive-Linquistic   Pain Assessment   Pain Assessment No/denies pain   Cognitive-Linquistic Treatment   Treatment focused on Cognition   Skilled Treatment SLP assessed pt's awareness via asking what modifications she will make once she returns to work - anticipatory awareness appears WNL. Divided  attention tasks with auditory task (synopsis of news story) and written task (deductive reasoning) with 93% success on deductive reasoning and 100% with providing synopsis. Pt pointed out that error like she made today would not be made with compensations discussed earlier in the session.   Assessment / Recommendations / Plan   Plan Continue with current plan of care   Progression Toward Goals   Progression toward goals Progressing toward goals          SLP Education - 01/11/15 1105    Education provided Yes   Education Details attention compensations   Person(s) Educated Patient   Methods Explanation   Comprehension Verbalized understanding          SLP Short Term Goals - 01/06/15 1054    SLP SHORT TERM GOAL #1   Title pt to ID errors in high level cognitive linguistic tasks 100%   Time 1   Status Achieved   SLP SHORT TERM GOAL #2   Title pt will divide attention with 90% success on both tasks over 25 mintues   Time 1   Period Weeks   Status On-going          SLP Long Term Goals - 01/11/15 1107    SLP LONG TERM GOAL #1   Title pt will ID errors in complex cognitive linguistic tasks 100% with self-correction   Time 5  Period Weeks   Status On-going   SLP LONG TERM GOAL #2   Title pt will divide attention in 35 minutes complex cognitive linguistic tasks with 100% success with self-correction   Time 5   Period Weeks   Status On-going          Problem List Patient Active Problem List   Diagnosis Date Noted  . Coarctation of aorta, recurrent, post-intervention   . Hypokalemia   . 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 Atrial flutter 11/16/2014  . Chest pain on respiration 10/07/2011  . ASTHMA, UNSPECIFIED, UNSPECIFIED STATUS 05/30/2009  . OTHER OSTEOPOROSIS 05/30/2009  . MITRAL VALVE REPLACEMENT, HX OF 05/30/2009    [facility] , MS, CCC-SLP  01/11/2015, 11:07 AM  [facility] [address] [place], [place], [address] Phone: [phone]   Fax:  [phone]

## 2015-01-12 ENCOUNTER — Telehealth: Payer: Self-pay | Admitting: Neurology

## 2015-01-12 NOTE — Therapy (Addendum)
[facility] [address] [place], [place], [address] Phone: [phone]   Fax:  [phone]  Occupational Therapy Treatment  Patient Details  Name: Martha Clark MRN: [id number] Date of Birth: August 02, 1955 Referring Provider:  Merri Brunette, MD  Encounter Date: 01/11/2015      OT End of Session - 01/11/15 1311    Visit Number 9   Number of Visits 17   Authorization Type BCBS, 60 visit combined, 55 remaining ?at time of eval   OT Start Time 1110   OT Stop Time 1145   OT Time Calculation (min) 35 min   Activity Tolerance Patient tolerated treatment well   Behavior During Therapy [facility] for tasks assessed/performed      Past Medical History  Diagnosis Date  . Atrial fibrillation   . Asthma   . Osteoporosis   . Mitral valve prolapse   . History of CVA (cerebrovascular accident) 11/20/14  . 1St degree AV block   . Typical atrial flutter     Past Surgical History  Procedure Laterality Date  . Tubal ligation    . Mitral valve replacement  2008  . Radiology with anesthesia N/A 11/20/2014    Procedure: RADIOLOGY WITH ANESTHESIA;  Surgeon: Julieanne Cotton, MD;  Location: [facility] OR;  Service: Radiology;  Laterality: N/A;    There were no vitals filed for this visit.  Visit Diagnosis:  Cognitive deficits following cerebral infarction      Subjective Assessment - 01/11/15 1309    Subjective  Pt reports she had a wonderful weekend in [place]   Pertinent History MVP s/p bovine mitral valve replacement, A-fib, (see EPIC snap shot for additional info)   Patient Stated Goals return to work   Currently in Pain? No/denies              Treatment: Self care: Moderate complex word problems with multiple steps. Pt wrote out her work and was organized,yet she made several small errors by omission or miscalculation, min v.c.  From therapist when reviewing work.Pt reports her attention to detail is  impaired.                  OT Short Term Goals - 12/31/14 1519    OT SHORT TERM GOAL #1   Title I with HEP.   Status On-going   OT SHORT TERM GOAL #2   Title Pt will increase LUE grip strength  to at least 25 lbs for increased functional use.   Baseline LUE 32 lbs   Status Achieved   OT SHORT TERM GOAL #3   Title Pt will demonstrate ability to perform a physical and cognitive task simultaneously with 80% or greater acccuracy and no greater than 1 LOB.   Status On-going   OT SHORT TERM GOAL #4   Title Pt will demonstrate adequate LUE strength and endurance to retrieve 5 lbs weight from overhead shelf x 5 reps without drops.   Status On-going   OT SHORT TERM GOAL #5   Title Pt will demonstrate ability to organize and complete a mod complex cooking task, demonstrating good safety awareness, modified independently.   Status On-going           OT Long Term Goals - 12/09/14 2005    OT LONG TERM GOAL #1   Title Pt will demonstrate LUE grip strength of 30 lbs or greater for increased functional use,   Baseline due 02/06/15   Time 8   Status New  OT LONG TERM GOAL #2   Title Pt will demonstrate ability to perform a physical and cognitive task simultaneously, with 95%  or better accuracy and no LOB.   Time 8   Period Weeks   Status New   OT LONG TERM GOAL #3   Title Pt will report adequate strength and endurance to perform  home mnagement/ cooking activities in standing for 1 hour prior to rest break.   Time 8   Period Weeks   Status New   OT LONG TERM GOAL #4   Title Pt will demonstrate ability to perfom a moderate complex organization task with 95% or greater accuracy.   Time 8   Period Weeks   Status New               Plan - 01/11/15 1309    Clinical Impression Statement Pt is progressing towards goals yet she continues to demonstrate decreased attention to fine detail and makes errors as a result.   Pt will benefit from skilled therapeutic  intervention in order to improve on the following deficits (Retired) Decreased endurance;Decreased activity tolerance;Impaired UE functional use;Decreased cognition;Decreased mobility;Decreased strength   Rehab Potential Good   OT Duration 8 weeks   OT Treatment/Interventions Self-care/ADL training;Fluidtherapy;Moist Heat;DME and/or AE instruction;Patient/family education;Therapeutic exercises;Ultrasound;Therapeutic exercise;Therapeutic activities;Cognitive remediation/compensation;Passive range of motion;Neuromuscular education;Electrical Stimulation;Visual/perceptual remediation/compensation;Cryotherapy;Parrafin   Plan organization, divided attention tasks   Consulted and Agree with Plan of Care Patient        Problem List Patient Active Problem List   Diagnosis Date Noted  . Coarctation of aorta, recurrent, post-intervention   . Hypokalemia   . 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 Atrial flutter 11/16/2014  . Chest pain on respiration 10/07/2011  . ASTHMA, UNSPECIFIED, UNSPECIFIED STATUS 05/30/2009  . OTHER OSTEOPOROSIS 05/30/2009  . MITRAL VALVE REPLACEMENT, HX OF 05/30/2009    RINE,KATHRYN 01/12/2015, 12:38 PM Keene Breath, OTR/L [phone] Phone: [phone] 12:38 PM 01/12/2015 [facility] [address] [place], [place], [address] Phone: [phone]   Fax:  [phone]

## 2015-01-12 NOTE — Telephone Encounter (Signed)
Pt called and would like her returned to work letter edited: Occupational therapy suggested that she work half a day for two weeks starting Sept. 19th thru Oct. 30th. Full days starting Oct. 3rd. Please call and advise [phone]

## 2015-01-12 NOTE — Telephone Encounter (Signed)
I agree kindly admitted the letter as she wants

## 2015-01-13 ENCOUNTER — Ambulatory Visit: Payer: BLUE CROSS/BLUE SHIELD | Admitting: Occupational Therapy

## 2015-01-13 ENCOUNTER — Ambulatory Visit: Payer: BLUE CROSS/BLUE SHIELD

## 2015-01-13 DIAGNOSIS — R6889 Other general symptoms and signs: Secondary | ICD-10-CM

## 2015-01-13 DIAGNOSIS — I69319 Unspecified symptoms and signs involving cognitive functions following cerebral infarction: Secondary | ICD-10-CM

## 2015-01-13 DIAGNOSIS — R41841 Cognitive communication deficit: Secondary | ICD-10-CM | POA: Diagnosis not present

## 2015-01-13 DIAGNOSIS — IMO0002 Reserved for concepts with insufficient information to code with codable children: Secondary | ICD-10-CM

## 2015-01-13 NOTE — Therapy (Signed)
[facility] [address] [place], [place], [address] Phone: [phone]   Fax:  [phone]  Occupational Therapy Treatment  Patient Details  Name: Martha Clark MRN: [id number] Date of Birth: 1955/11/07 Referring Provider:  Merri Brunette, MD  Encounter Date: 01/13/2015      OT End of Session - 01/13/15 1035    Visit Number 10   Number of Visits 17   Authorization Type BCBS, 60 visit combined, 55 remaining ?at time of eval   OT Start Time 1018   OT Stop Time 1100   OT Time Calculation (min) 42 min   Activity Tolerance Patient tolerated treatment well   Behavior During Therapy WFL for tasks assessed/performed      Past Medical History  Diagnosis Date  . Atrial fibrillation   . Asthma   . Osteoporosis   . Mitral valve prolapse   . History of CVA (cerebrovascular accident) 11/20/14  . 1St degree AV block   . Typical atrial flutter     Past Surgical History  Procedure Laterality Date  . Tubal ligation    . Mitral valve replacement  2008  . Radiology with anesthesia N/A 11/20/2014    Procedure: RADIOLOGY WITH ANESTHESIA;  Surgeon: Julieanne Cotton, MD;  Location: [facility] OR;  Service: Radiology;  Laterality: N/A;    There were no vitals filed for this visit.  Visit Diagnosis:  Cognitive deficits following cerebral infarction  Weakness due to cerebrovascular accident  Decreased functional activity tolerance    Treatment: moderate level word problems regarding time for increased attention to detail/ organization, Pt made error but was able to self correct. Pt demonstrates improving attention to detail and pt finds that writing out steps helps her process.Discussion regarding ways pt will incorporate compensations into her work day. Therapist suggested pt write out questions that she needs to know prior to meetings. Pt plans to build additional time into her work day for prep. Deductive reasoning activity in a  mod distracting environment, pt required mod assistance/ v.c. to complete task despite writing out task. Pt reported that the task was challenging. Pt is going to have surgery next week and will not return to therapy until the following week if cleared by MD.                          OT Short Term Goals - 12/31/14 1519    OT SHORT TERM GOAL #1   Title I with HEP.   Status On-going   OT SHORT TERM GOAL #2   Title Pt will increase LUE grip strength  to at least 25 lbs for increased functional use.   Baseline LUE 32 lbs   Status Achieved   OT SHORT TERM GOAL #3   Title Pt will demonstrate ability to perform a physical and cognitive task simultaneously with 80% or greater acccuracy and no greater than 1 LOB.   Status On-going   OT SHORT TERM GOAL #4   Title Pt will demonstrate adequate LUE strength and endurance to retrieve 5 lbs weight from overhead shelf x 5 reps without drops.   Status On-going   OT SHORT TERM GOAL #5   Title Pt will demonstrate ability to organize and complete a mod complex cooking task, demonstrating good safety awareness, modified independently.   Status On-going           OT Long Term Goals - 12/09/14 2005    OT LONG TERM GOAL #  1   Title Pt will demonstrate LUE grip strength of 30 lbs or greater for increased functional use,   Baseline due 02/06/15   Time 8   Status New   OT LONG TERM GOAL #2   Title Pt will demonstrate ability to perform a physical and cognitive task simultaneously, with 95%  or better accuracy and no LOB.   Time 8   Period Weeks   Status New   OT LONG TERM GOAL #3   Title Pt will report adequate strength and endurance to perform  home mnagement/ cooking activities in standing for 1 hour prior to rest break.   Time 8   Period Weeks   Status New   OT LONG TERM GOAL #4   Title Pt will demonstrate ability to perfom a moderate complex organization task with 95% or greater accuracy.   Time 8   Period Weeks   Status New                Plan - 01/13/15 1032    Clinical Impression Statement Pt is progressing towards goals with improved organization and attention to detail. Pt demonstrated significant difficulty with deductive reasoning problem today, requiring mod v.c.   Pt will benefit from skilled therapeutic intervention in order to improve on the following deficits (Retired) Decreased endurance;Decreased activity tolerance;Impaired UE functional use;Decreased cognition;Decreased mobility;Decreased strength   Rehab Potential Good   OT Frequency 2x / week   OT Treatment/Interventions Self-care/ADL training;Fluidtherapy;Moist Heat;DME and/or AE instruction;Patient/family education;Therapeutic exercises;Ultrasound;Therapeutic exercise;Therapeutic activities;Cognitive remediation/compensation;Passive range of motion;Neuromuscular education;Electrical Stimulation;Visual/perceptual remediation/compensation;Cryotherapy;Parrafin   Plan Pt on hold until cleared by MD to return to therapy post surgery, deductive reasoning, divided attention.   Consulted and Agree with Plan of Care Patient        Problem List Patient Active Problem List   Diagnosis Date Noted  . Coarctation of aorta, recurrent, post-intervention   . Hypokalemia   . 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 Atrial flutter 11/16/2014  . Chest pain on respiration 10/07/2011  . ASTHMA, UNSPECIFIED, UNSPECIFIED STATUS 05/30/2009  . OTHER OSTEOPOROSIS 05/30/2009  . MITRAL VALVE REPLACEMENT, HX OF 05/30/2009    Tiane Szydlowski 01/13/2015, 5:26 PM Keene Breath, OTR/L [phone] Phone: [phone] 5:26 PM 01/13/2015 [facility] Out[facility] [address] [place], [place], [address] Phone: [phone]   Fax:  [phone]

## 2015-01-13 NOTE — Therapy (Signed)
[facility] [address] [place], [place], [address] Phone: [phone]   Fax:  [phone]  Speech Language Pathology Treatment  Patient Details  Name: Martha Clark MRN: [id number] Date of Birth: 02/26/1956 Referring Provider:  Merri Brunette, MD  Encounter Date: 01/13/2015      End of Session - 01/13/15 1706    Visit Number 9   Number of Visits 17   Date for SLP Re-Evaluation 02/08/15   Authorization - Visit Number 8   Authorization - Number of Visits 50   SLP Start Time 0933   SLP Stop Time  1016   SLP Time Calculation (min) 43 min   Activity Tolerance Patient tolerated treatment well      Past Medical History  Diagnosis Date  . Atrial fibrillation   . Asthma   . Osteoporosis   . Mitral valve prolapse   . History of CVA (cerebrovascular accident) 11/20/14  . 1St degree AV block   . Typical atrial flutter     Past Surgical History  Procedure Laterality Date  . Tubal ligation    . Mitral valve replacement  2008  . Radiology with anesthesia N/A 11/20/2014    Procedure: RADIOLOGY WITH ANESTHESIA;  Surgeon: Julieanne Cotton, MD;  Location: [facility] OR;  Service: Radiology;  Laterality: N/A;    There were no vitals filed for this visit.  Visit Diagnosis: Cognitive communication deficit      Subjective Assessment - 01/13/15 0938    Subjective "I decided I needed to make some changes at work when I go back."               ADULT SLP TREATMENT - 01/13/15 0939    General Information   Behavior/Cognition Alert;Cooperative;Pleasant mood   Treatment Provided   Treatment provided Cognitive-Linquistic   Pain Assessment   Pain Assessment No/denies pain   Cognitive-Linquistic Treatment   Treatment focused on Cognition   Skilled Treatment Pt completed homework not fully engaged in another task - SLP encouraged pt to copmlete homework in divided attention circumstance. Divided attention tasks (written  and auditory) were completed today with 95% alternating attention instead of with divided attention. Pt remarked she would have likely been able to have completed these tasks together prior to hospital admission. SLP provided expertise re: neuropsych testing and what this type testing may be able to shed light upon in her decision of/when to return to work.    Assessment / Recommendations / Plan   Plan Continue with current plan of care   Progression Toward Goals   Progression toward goals Progressing toward goals            SLP Short Term Goals - 01/06/15 1054    SLP SHORT TERM GOAL #1   Title pt to ID errors in high level cognitive linguistic tasks 100%   Time 1   Status Achieved   SLP SHORT TERM GOAL #2   Title pt will divide attention with 90% success on both tasks over 25 mintues   Time 1   Period Weeks   Status On-going          SLP Long Term Goals - 01/11/15 1107    SLP LONG TERM GOAL #1   Title pt will ID errors in complex cognitive linguistic tasks 100% with self-correction   Time 5   Period Weeks   Status On-going   SLP LONG TERM GOAL #2   Title pt will divide attention in 35 minutes complex  cognitive linguistic tasks with 100% success with self-correction   Time 5   Period Weeks   Status On-going          Plan - 01/13/15 1707    Clinical Impression Statement Pt's attention (divided) skills with mod difficult written task and min-mod difficult auditory task was completed with almost 100% alternating attention. Pt's anticipatory awareness remains a relative strength as pt cont to tell SLP how she will need to modify environment when she returns to work (half days to start).   Speech Therapy Frequency 2x / week   Duration --  5 weeks   Treatment/Interventions SLP instruction and feedback;Compensatory strategies;Patient/family education;Functional tasks;Cueing hierarchy;Cognitive reorganization   Potential to Achieve Goals Good        Problem  List Patient Active Problem List   Diagnosis Date Noted  . Coarctation of aorta, recurrent, post-intervention   . Hypokalemia   . 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 Atrial flutter 11/16/2014  . Chest pain on respiration 10/07/2011  . ASTHMA, UNSPECIFIED, UNSPECIFIED STATUS 05/30/2009  . OTHER OSTEOPOROSIS 05/30/2009  . MITRAL VALVE REPLACEMENT, HX OF 05/30/2009    [facility] , MS, CCC-SLP  01/13/2015, 5:10 PM  [facility] [address] [place], [place], [address] Phone: [phone]   Fax:  [phone]

## 2015-01-13 NOTE — Patient Instructions (Signed)
Do activities at home that divide your attention

## 2015-01-13 NOTE — Telephone Encounter (Signed)
Patients note was done and given to her in person at the office today.Also her two letters were fax to [employer] per her request, at [phone]

## 2015-01-14 ENCOUNTER — Telehealth: Payer: Self-pay | Admitting: Internal Medicine

## 2015-01-14 NOTE — Telephone Encounter (Signed)
New message      Pt is due for an ablation Monday.  She want to make sure she understands her instructions

## 2015-01-14 NOTE — Telephone Encounter (Signed)
Called patient about her instructions for her ablation on Monday. Went over patient's instruction sheet given to her on 12/17/2014. Patient verbalized understanding and just wanted to make sure everything was still on schedule.

## 2015-01-17 ENCOUNTER — Encounter (HOSPITAL_COMMUNITY)
Admission: AD | Disposition: A | Discharge status: Home / Self Care | Payer: Self-pay | Source: Ambulatory Visit | Attending: Internal Medicine

## 2015-01-17 ENCOUNTER — Ambulatory Visit (HOSPITAL_COMMUNITY)
Admission: AD | Admit: 2015-01-17 | Discharge: 2015-01-18 | Disposition: A | Discharge status: Home / Self Care | Payer: BLUE CROSS/BLUE SHIELD | Source: Ambulatory Visit | Attending: Internal Medicine | Admitting: Internal Medicine

## 2015-01-17 DIAGNOSIS — Z8673 Personal history of transient ischemic attack (TIA), and cerebral infarction without residual deficits: Secondary | ICD-10-CM | POA: Diagnosis not present

## 2015-01-17 DIAGNOSIS — J45909 Unspecified asthma, uncomplicated: Secondary | ICD-10-CM | POA: Diagnosis not present

## 2015-01-17 DIAGNOSIS — Z7901 Long term (current) use of anticoagulants: Secondary | ICD-10-CM | POA: Insufficient documentation

## 2015-01-17 DIAGNOSIS — I051 Rheumatic mitral insufficiency: Secondary | ICD-10-CM | POA: Insufficient documentation

## 2015-01-17 DIAGNOSIS — Z952 Presence of prosthetic heart valve: Secondary | ICD-10-CM | POA: Insufficient documentation

## 2015-01-17 DIAGNOSIS — I4892 Unspecified atrial flutter: Secondary | ICD-10-CM | POA: Diagnosis present

## 2015-01-17 DIAGNOSIS — I480 Paroxysmal atrial fibrillation: Secondary | ICD-10-CM | POA: Insufficient documentation

## 2015-01-17 DIAGNOSIS — I483 Typical atrial flutter: Secondary | ICD-10-CM | POA: Insufficient documentation

## 2015-01-17 DIAGNOSIS — Z79899 Other long term (current) drug therapy: Secondary | ICD-10-CM | POA: Insufficient documentation

## 2015-01-17 HISTORY — PX: ELECTROPHYSIOLOGIC STUDY: SHX172A

## 2015-01-17 SURGERY — A-FLUTTER/A-TACH/SVT ABLATION
Anesthesia: LOCAL

## 2015-01-17 MED ORDER — SODIUM CHLORIDE 0.9 % IV SOLN: 250.0000 mL | INTRAVENOUS | Status: DC | PRN

## 2015-01-17 MED ORDER — ONDANSETRON HCL 4 MG/2ML IJ SOLN: 4.0000 mg | Freq: Four times a day (QID) | INTRAMUSCULAR | Status: DC | PRN

## 2015-01-17 MED ORDER — MIDAZOLAM HCL 5 MG/5ML IJ SOLN
INTRAMUSCULAR | Status: AC
  Filled 2015-01-17: qty 25

## 2015-01-17 MED ORDER — SODIUM CHLORIDE 0.9 % IJ SOLN
3.0000 mL | Freq: Two times a day (BID) | INTRAMUSCULAR | Status: DC
  Administered 2015-01-17: 3 mL via INTRAVENOUS

## 2015-01-17 MED ORDER — COQ-10 100 MG PO CAPS: 1.0000 | ORAL_CAPSULE | Freq: Every day | ORAL | Status: DC

## 2015-01-17 MED ORDER — METOPROLOL TARTRATE 25 MG PO TABS
25.0000 mg | ORAL_TABLET | Freq: Two times a day (BID) | ORAL | Status: DC
  Administered 2015-01-17 – 2015-01-18 (×2): 25 mg via ORAL
  Filled 2015-01-17 (×2): qty 1

## 2015-01-17 MED ORDER — DILTIAZEM HCL ER COATED BEADS 180 MG PO CP24
180.0000 mg | ORAL_CAPSULE | Freq: Every day | ORAL | Status: DC
  Filled 2015-01-17: qty 1

## 2015-01-17 MED ORDER — BUPIVACAINE HCL (PF) 0.25 % IJ SOLN
INTRAMUSCULAR | Status: AC
  Filled 2015-01-17: qty 30

## 2015-01-17 MED ORDER — MIDAZOLAM HCL 5 MG/5ML IJ SOLN
INTRAMUSCULAR | Status: DC | PRN
  Administered 2015-01-17 (×2): 1 mg via INTRAVENOUS
  Administered 2015-01-17: 2 mg via INTRAVENOUS
  Administered 2015-01-17 (×3): 1 mg via INTRAVENOUS

## 2015-01-17 MED ORDER — SODIUM CHLORIDE 0.9 % IJ SOLN: 3.0000 mL | INTRAMUSCULAR | Status: DC | PRN

## 2015-01-17 MED ORDER — FENTANYL CITRATE (PF) 100 MCG/2ML IJ SOLN
INTRAMUSCULAR | Status: DC | PRN
  Administered 2015-01-17 (×2): 12.5 ug via INTRAVENOUS
  Administered 2015-01-17 (×2): 25 ug via INTRAVENOUS
  Administered 2015-01-17: 12.5 ug via INTRAVENOUS

## 2015-01-17 MED ORDER — FENTANYL CITRATE (PF) 100 MCG/2ML IJ SOLN
INTRAMUSCULAR | Status: AC
  Filled 2015-01-17: qty 4

## 2015-01-17 MED ORDER — ACETAMINOPHEN 325 MG PO TABS
650.0000 mg | ORAL_TABLET | ORAL | Status: DC | PRN
  Administered 2015-01-17: 650 mg via ORAL
  Filled 2015-01-17: qty 2

## 2015-01-17 MED ORDER — OMEGA-3-ACID ETHYL ESTERS 1 G PO CAPS
1.0000 | ORAL_CAPSULE | Freq: Every day | ORAL | Status: DC
  Administered 2015-01-18: 1 g via ORAL
  Filled 2015-01-17: qty 1

## 2015-01-17 MED ORDER — VITAMIN D 1000 UNITS PO TABS
1000.0000 [IU] | ORAL_TABLET | Freq: Every day | ORAL | Status: DC
  Administered 2015-01-17 – 2015-01-18 (×2): 1000 [IU] via ORAL
  Filled 2015-01-17 (×2): qty 1

## 2015-01-17 MED ORDER — ADULT MULTIVITAMIN W/MINERALS CH
1.0000 | ORAL_TABLET | Freq: Every day | ORAL | Status: DC
  Administered 2015-01-18: 1 via ORAL
  Filled 2015-01-17: qty 1

## 2015-01-17 MED ORDER — MAGNESIUM CHLORIDE 64 MG PO TBEC
DELAYED_RELEASE_TABLET | Freq: Every day | ORAL | Status: DC
  Administered 2015-01-18: 64 mg via ORAL
  Filled 2015-01-17: qty 1

## 2015-01-17 MED ORDER — APIXABAN 5 MG PO TABS
5.0000 mg | ORAL_TABLET | Freq: Two times a day (BID) | ORAL | Status: DC
  Administered 2015-01-17 – 2015-01-18 (×2): 5 mg via ORAL
  Filled 2015-01-17 (×2): qty 2
  Filled 2015-01-17: qty 1

## 2015-01-17 MED ORDER — FENTANYL CITRATE (PF) 100 MCG/2ML IJ SOLN: 25.0000 ug | INTRAMUSCULAR | Status: DC | PRN

## 2015-01-17 MED ORDER — VITAMIN C 500 MG PO TABS
500.0000 mg | ORAL_TABLET | Freq: Every day | ORAL | Status: DC
  Administered 2015-01-18: 500 mg via ORAL
  Filled 2015-01-17: qty 1

## 2015-01-17 MED ORDER — CALCIUM CARBONATE-VITAMIN D 500-200 MG-UNIT PO TABS
1.0000 | ORAL_TABLET | Freq: Every day | ORAL | Status: DC
  Administered 2015-01-18: 1 via ORAL
  Filled 2015-01-17: qty 1

## 2015-01-17 SURGICAL SUPPLY — 10 items
BAG SNAP BAND KOVER 36X36 (MISCELLANEOUS) ×1 IMPLANT
CATH BLAZERPRIME XP (ABLATOR) ×1 IMPLANT
CATH JOSEPHSON QUAD-ALLRED 6FR (CATHETERS) ×1 IMPLANT
CATH POLARIS X 2.5/5/2.5 DECAP (CATHETERS) ×1 IMPLANT
PACK EP LATEX FREE (CUSTOM PROCEDURE TRAY) ×2
PACK EP LF (CUSTOM PROCEDURE TRAY) IMPLANT
PAD DEFIB LIFELINK (PAD) ×1 IMPLANT
SHEATH PINNACLE 6F 10CM (SHEATH) ×2 IMPLANT
SHEATH PINNACLE 8F 10CM (SHEATH) ×1 IMPLANT
SHIELD RADPAD SCOOP 12X17 (MISCELLANEOUS) ×1 IMPLANT

## 2015-01-17 NOTE — Progress Notes (Addendum)
Site area: rt groin 3 fv sheaths Site Prior to Removal:  Level 0 Pressure Applied For:  20 minutes Manual:   yes Patient Status During Pull:  stable Post Pull Site:  Level  0 Post Pull Instructions Given:  yes Post Pull Pulses Present: yes Dressing Applied:  Small tegaderm Bedrest begins @ 1700 Comments:  0. IV saline locked.

## 2015-01-17 NOTE — H&P (Signed)
HPI Martha Clark is referred today by Dr. Jacinto Halim for evaluation of atrial flutter. 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 She had sob and palpitations which awakened her from sleep a month ago, she presented to the ED where she was in either atrial fib or flutter, difficult to discern, and was cardioverted. 3 days later she had a stroke. She has undergone rehab and has minimal deficit. She has subsequently developed atrial flutter with a RVR, now controlled VR. She denies syncope. She has minimal palpitations but notes that her energy level is reduced.  No Known Allergies   Current Outpatient Prescriptions  Medication Sig Dispense Refill  . acetaminophen (TYLENOL) 325 MG tablet Take 1,300 mg by mouth every 6 (six) hours as needed for mild pain.    Marland Kitchen apixaban (ELIQUIS) 5 MG TABS tablet Take 1 tablet (5 mg total) by mouth 2 (two) times daily. 60 tablet 12  . buPROPion (WELLBUTRIN XL) 150 MG 24 hr tablet Take 150 mg by mouth Daily.     . calcium citrate-vitamin D (CITRACAL+D) 315-200 MG-UNIT per tablet Take 1 tablet by mouth daily.     . Cholecalciferol (VITAMIN D) 1000 UNITS capsule Take 1,000 Units by mouth daily.     . Coenzyme Q10 (COQ-10) 100 MG CAPS Take 1 tablet by mouth daily.     Marland Kitchen diltiazem (CARDIZEM CD) 180 MG 24 hr capsule Take 1 capsule (180 mg total) by mouth daily. 30 capsule 2  . Fish Oil OIL Take 15 mLs by mouth daily.     Marland Kitchen MAGNESIUM CHLORIDE PO Take 1 tablet by mouth daily.     . metoprolol tartrate (LOPRESSOR) 25 MG tablet Take 1 tablet (25 mg total) by mouth 2 (two) times daily. 60 tablet 2  . Multiple Vitamin (MULTIVITAMIN WITH MINERALS) TABS tablet Take 1 tablet by mouth daily.    . vitamin C (ASCORBIC ACID) 500 MG tablet Take 500 mg by mouth daily.     No current  facility-administered medications for this visit.     Past Medical History  Diagnosis Date  . Atrial fibrillation   . Asthma   . Osteoporosis   . Mitral valve prolapse   . History of CVA (cerebrovascular accident) 11/20/14  . 1St degree AV block   . Typical atrial flutter     ROS:  All systems reviewed and negative except as noted in the HPI.   Past Surgical History  Procedure Laterality Date  . Tubal ligation    . Mitral valve replacement  2008  . Radiology with anesthesia N/A 11/20/2014    Procedure: RADIOLOGY WITH ANESTHESIA; Surgeon: Julieanne Cotton, MD; Location: [facility] OR; Service: Radiology; Laterality: N/A;     Family History  Problem Relation Age of Onset  . Diabetes Father   . Stroke Father   . Hypertension Sister   . Cancer Paternal Uncle     lung cancer  . Breast cancer Maternal Grandmother 68     History   Social History  . Marital Status: Married    Spouse Name: N/A  . Number of Children: N/A  . Years of Education: N/A   Occupational History  . Not on file.   Social History Main Topics  . Smoking status: Never Smoker   . Smokeless tobacco: Not on file  . Alcohol Use: No  . Drug Use: No  . Sexual Activity: Not Currently  Birth Control/ Protection: None   Other Topics Concern  . Not on file   Social History Narrative     BP 96/72 mmHg  Pulse 87  Ht  (1.702 m)  Wt 123 lb 9.6 oz (56.065 kg)  BMI 19.35 kg/m2  Physical Exam:  Well appearing 59 yo woman, NAD HEENT: Unremarkable Neck: No JVD, no thyromegally Lymphatics: No adenopathy Back: No CVA tenderness Lungs: Clear with no wheezes HEART: IRegular rate rhythm, no murmurs, no rubs, no clicks Abd: soft, positive bowel sounds, no organomegally, no rebound, no guarding Ext: 2 plus pulses, no edema, no cyanosis, no clubbing Skin: No  rashes no nodules Neuro: CN II through XII intact, motor grossly intact  EKG - atrial flutter with a controlled VR  Assess/Plan:            Atrial flutter - Marinus Maw, MD at 12/17/2014 6:17 PM     Status: Written Related Problem: Atrial flutter   Expand All Collapse All   I have discussed the treatment options with the patient and her husband. I have recommended she undergo catheter ablation of atrial flutter. This will be scheduled in the coming weeks.             Embolic cerebral infarction s/p revascularization, d/t atrial fib from tissue valve - Marinus Maw, MD at 12/17/2014 6:19 PM     Status: Written Related Problem: Embolic cerebral infarction s/p revascularization, d/t atrial fib from tissue valve   Expand All Collapse All   Unclear if she actually had atrial fib or flutter back in June. She clearly has atrial flutter now. She will need long term anti-coagulation.        EP Attending  Patient seen and examined. Agree with above. Since prior clinic visit, no change in the history, exam, assessment and plan. For catheter ablation of atrial flutter.  Martha Clark,M.D. Leonia Reeves.D.

## 2015-01-17 NOTE — Discharge Instructions (Signed)
No driving for 3 days. No lifting over 5 lbs for 1 week. No sexual activity for 1 week. You may return to work in 1 week.  Keep procedure site clean & dry. If you notice increased pain, swelling, bleeding or pus, call/return!  You may shower, but no soaking baths/hot tubs/pools for 1 week.  ° ° °

## 2015-01-18 ENCOUNTER — Encounter: Payer: BLUE CROSS/BLUE SHIELD | Admitting: Occupational Therapy

## 2015-01-18 ENCOUNTER — Telehealth: Payer: Self-pay | Admitting: Internal Medicine

## 2015-01-18 ENCOUNTER — Encounter (HOSPITAL_COMMUNITY): Payer: Self-pay | Admitting: Internal Medicine

## 2015-01-18 DIAGNOSIS — Z952 Presence of prosthetic heart valve: Secondary | ICD-10-CM | POA: Diagnosis not present

## 2015-01-18 DIAGNOSIS — I483 Typical atrial flutter: Secondary | ICD-10-CM | POA: Diagnosis not present

## 2015-01-18 DIAGNOSIS — I480 Paroxysmal atrial fibrillation: Secondary | ICD-10-CM | POA: Diagnosis not present

## 2015-01-18 DIAGNOSIS — Z8673 Personal history of transient ischemic attack (TIA), and cerebral infarction without residual deficits: Secondary | ICD-10-CM | POA: Diagnosis not present

## 2015-01-18 MED FILL — Bupivacaine HCl Preservative Free (PF) Inj 0.25%: INTRAMUSCULAR | Qty: 60 | Status: AC

## 2015-01-18 NOTE — Telephone Encounter (Signed)
Tried to call back but the extension number is not correct and all I get is automated voice mail telling me wrong ext.     The patient did have an ablation on 01/17/15 but I can not get information to appropriate person with out correct number

## 2015-01-18 NOTE — Telephone Encounter (Signed)
New message      Calling to verify pt had surgery yesterday for disability

## 2015-01-18 NOTE — Discharge Summary (Signed)
ELECTROPHYSIOLOGY PROCEDURE DISCHARGE SUMMARY    Patient ID: Martha Clark,  MRN: [id number], DOB/AGE: 1956/05/03 59 y.o.  Admit date: 01/17/2015 Discharge date: 01/18/2015  Primary Care Physician: Londell Moh, MD Primary Cardiologist: Jacinto Halim Electrophysiologist: Ladona Ridgel  Primary Discharge Diagnosis:  Atrial flutter status post ablation this admission  Secondary Discharge Diagnosis:  1.  Rheumatic heart disease s/p MVR 2.  Paroxysmal atrial fibrillation 3.  Prior CVA  No Known Allergies   Procedures This Admission: 1.  Electrophysiology study and radiofrequency catheter ablation on 01/17/15 by Dr Ladona Ridgel.  This study demonstrated typical atrial flutter with successful CTI ablation.  There were no inducible arrhythmias following ablation and no early apparent complications.   Brief HPI: Martha Clark is a 59 y.o. female with a past medical history as outlined above.  She has documented atrial flutter. She has been appropriately anticoagulated for >4 weeks.  Risks, benefits, and alternatives to ablation were reviewed with the patient who wished to proceed.   Hospital Course:  The patient was admitted and underwent EPS/RFCA with details as outlined above. She was monitored on telemetry overnight which demonstrated sinus rhythm.  Groin incision was without complication.  They were examined by Dr Ladona Ridgel who considered them stable for discharge to home.  Follow up Martha be arranged in 4 weeks.  Wound care and restrictions were reviewed with the patient prior to discharge.   Physical Exam: Filed Vitals:   01/17/15 1727 01/17/15 1821 01/17/15 2018 01/18/15 0634  BP: 110/68 123/77 122/63 121/74  Pulse: 48 46 53 55  Temp:    97.6 F (36.4 C)  TempSrc:    Oral  Resp: Height:      Weight:      SpO2: 100% 100% 98% 100%    GEN- The patient is well appearing, alert and oriented x 3 today.   HEENT: normocephalic, atraumatic; sclera clear, conjunctiva pink;  hearing intact; oropharynx clear; neck supple  Lungs- Clear to ausculation bilaterally, normal work of breathing.  No wheezes, rales, rhonchi Heart- Regular rate and rhythm, no murmurs, rubs or gallops  GI- soft, non-tender, non-distended, bowel sounds present  Extremities- no clubbing, cyanosis, or edema, groin without hematoma/bruit MS- no significant deformity or atrophy Skin- warm and dry, no rash or lesion Psych- euthymic mood, full affect Neuro- strength and sensation are intact   Labs:   Lab Results  Component Value Date   WBC 7.0 01/10/2015   HGB 15.9* 01/10/2015   HCT 47.5* 01/10/2015   MCV 84.6 01/10/2015   PLT 168.0 01/10/2015   No results for input(s): NA, K, CL, CO2, BUN, CREATININE, CALCIUM, PROT, BILITOT, ALKPHOS, ALT, AST, GLUCOSE in the last 168 hours.  Invalid input(s): LABALBU  Discharge Medications:    Medication List    STOP taking these medications        diltiazem 180 MG 24 hr capsule  Commonly known as:  CARDIZEM CD      TAKE these medications        acetaminophen 325 MG tablet  Commonly known as:  TYLENOL  Take 1,300 mg by mouth every 6 (six) hours as needed for mild pain.     apixaban 5 MG Tabs tablet  Commonly known as:  ELIQUIS  Take 1 tablet (5 mg total) by mouth 2 (two) times daily.     buPROPion 150 MG 24 hr tablet  Commonly known as:  WELLBUTRIN XL  Take 150 mg by mouth Daily.  calcium citrate-vitamin D 315-200 MG-UNIT per tablet  Commonly known as:  CITRACAL+D  Take 1 tablet by mouth daily.     CoQ-10 100 MG Caps  Take 1 tablet by mouth daily.     EYE VITAMINS PO  Take 1 tablet by mouth daily.     Fish Oil Oil  Take 15 mLs by mouth daily.     MAGNESIUM CHLORIDE PO  Take 1 tablet by mouth daily.     metoprolol tartrate 25 MG tablet  Commonly known as:  LOPRESSOR  Take 1 tablet (25 mg total) by mouth 2 (two) times daily.     multivitamin with minerals Tabs tablet  Take 1 tablet by mouth daily.     vitamin C 500  MG tablet  Commonly known as:  ASCORBIC ACID  Take 500 mg by mouth daily.     Vitamin D 1000 UNITS capsule  Take 1,000 Units by mouth daily.        Disposition:  Discharge Instructions    Diet - low sodium heart healthy    Complete by:  As directed      Increase activity slowly    Complete by:  As directed           Follow-up Information    Follow up with Lewayne Bunting, MD On 02/16/2015.   Specialty:  Cardiology   Why:  at 3PM   Contact information:   [address] [place] [address] [phone]       Duration of Discharge Encounter: Greater than 30 minutes including physician time.  Signed, Gypsy Balsam, NP 01/18/2015 7:30 AM    EP Attending  Patient seen and examined. Agree with the documentation and findings as noted above. She is in NSR and stable for discharge home. Usual followup.  Leonia Reeves.D.

## 2015-01-20 ENCOUNTER — Encounter: Payer: BLUE CROSS/BLUE SHIELD | Admitting: Occupational Therapy

## 2015-01-20 NOTE — Telephone Encounter (Signed)
Follow up      Pt had an ablation last Monday.  This am her bp is 133/70 and HR 52.  She states her bp has never been this high.  Should she adjust some of her medications?

## 2015-01-20 NOTE — Telephone Encounter (Signed)
lmom for patient that her BP is okay.  Considering she has just had a procedure and should return to normal as she heals over the week.  She can call me back if needed

## 2015-01-25 ENCOUNTER — Ambulatory Visit: Payer: BLUE CROSS/BLUE SHIELD | Admitting: Occupational Therapy

## 2015-01-25 ENCOUNTER — Telehealth: Payer: Self-pay | Admitting: Internal Medicine

## 2015-01-25 NOTE — Telephone Encounter (Signed)
Procedure 8/22.  Should go back to work within a week but she has had a CVA and will need to get clearance from Neurologist

## 2015-01-25 NOTE — Telephone Encounter (Signed)
New Message  Pt is calling because she needs a order in witting that she   Can attend Speech and occupational therapy

## 2015-01-25 NOTE — Telephone Encounter (Signed)
New message      Calling to verify pt had a procedure.  Need name of procedure and how long pt will be out of work

## 2015-01-25 NOTE — Telephone Encounter (Signed)
Discussed with Dr Ladona Ridgel and she is okay to go to speech and OT.  Patient aware

## 2015-01-26 ENCOUNTER — Telehealth: Payer: Self-pay | Admitting: Internal Medicine

## 2015-01-26 ENCOUNTER — Ambulatory Visit: Payer: BLUE CROSS/BLUE SHIELD

## 2015-01-26 ENCOUNTER — Ambulatory Visit: Payer: BLUE CROSS/BLUE SHIELD | Admitting: Occupational Therapy

## 2015-01-26 DIAGNOSIS — I69319 Unspecified symptoms and signs involving cognitive functions following cerebral infarction: Secondary | ICD-10-CM

## 2015-01-26 DIAGNOSIS — R41841 Cognitive communication deficit: Secondary | ICD-10-CM

## 2015-01-26 DIAGNOSIS — R6889 Other general symptoms and signs: Secondary | ICD-10-CM

## 2015-01-26 DIAGNOSIS — IMO0002 Reserved for concepts with insufficient information to code with codable children: Secondary | ICD-10-CM

## 2015-01-26 NOTE — Therapy (Signed)
[facility] [address] [place], [place], [address] Phone: [phone]   Fax:  [phone]  Occupational Therapy Treatment  Patient Details  Name: Martha Clark MRN: [id number] Date of Birth: February 20, 1956 Referring Provider:  Deland Pretty, MD  Encounter Date: 01/26/2015      OT End of Session - 01/26/15 0943    Visit Number 11   Number of Visits 17   Authorization Type [employer], 60 visit combined, 30 remaining ?at time of eval   OT Start Time 0935   OT Stop Time 1015   OT Time Calculation (min) 40 min      Past Medical History  Diagnosis Date  . Atrial fibrillation   . Asthma   . Osteoporosis   . Mitral valve prolapse   . History of CVA (cerebrovascular accident) 11/20/14  . 1St degree AV block   . Typical atrial flutter     Past Surgical History  Procedure Laterality Date  . Tubal ligation    . Mitral valve replacement  2008  . Radiology with anesthesia N/A 11/20/2014    Procedure: RADIOLOGY WITH ANESTHESIA;  Surgeon: Luanne Bras, MD;  Location: [facility];  Service: Radiology;  Laterality: N/A;  . Electrophysiologic study N/A 01/17/2015    Procedure: A-Flutter Ablation;  Surgeon: Evans Lance, MD;  Location: [facility];  Service: Cardiovascular;  Laterality: N/A;    There were no vitals filed for this visit.  Visit Diagnosis:  Cognitive deficits following cerebral infarction  Weakness due to cerebrovascular accident  Decreased functional activity tolerance      Subjective Assessment - 01/26/15 0936    Subjective  Pt reports feeling better since surgery, clearance received from MD for return to therapy   Pertinent History MVP s/p bovine mitral valve replacement, A-fib, (see EPIC snap shot for additional info)   Patient Stated Goals return to work   Currently in Pain? No/denies   Pain Score --  intermittant left shoulder 5/10   Pain Frequency Intermittent   Aggravating Factors   positioning   Pain Relieving Factors repositioning       Treatment: Mod complex word problems requiring attention to fine details and organization, with increased time 100% accuracy. Deductive reasoning problem with 100 % accuracy today. Pt reports she feels more like herself since surgery and she has neuropsych testing later this week.                         OT Short Term Goals - 01/26/15 1010    OT SHORT TERM GOAL #1   Title I with HEP.   Baseline theraband with occasional min v.c.   Status Partially Met   OT SHORT TERM GOAL #2   Title Pt will increase LUE grip strength  to at least 25 lbs for increased functional use.   Baseline LUE 32 lbs   Status Achieved   OT SHORT TERM GOAL #3   Title Pt will demonstrate ability to perform a physical and cognitive task simultaneously with 80% or greater acccuracy and no greater than 1 LOB.   Status On-going   OT SHORT TERM GOAL #4   Title Pt will demonstrate adequate LUE strength and endurance to retrieve 5 lbs weight from overhead shelf x 5 reps without drops.   Status On-going   OT SHORT TERM GOAL #5   Title Pt will demonstrate ability to organize and complete a mod complex cooking task, demonstrating good safety  awareness, modified independently.   Baseline Performs however pt turned on wrong stove burner yet self corrected   Status On-going           OT Long Term Goals - 12/09/14 2005    OT LONG TERM GOAL #1   Title Pt will demonstrate LUE grip strength of 30 lbs or greater for increased functional use,   Baseline due 02/06/15   Time 8   Status New   OT LONG TERM GOAL #2   Title Pt will demonstrate ability to perform a physical and cognitive task simultaneously, with 95%  or better accuracy and no LOB.   Time 8   Period Weeks   Status New   OT LONG TERM GOAL #3   Title Pt will report adequate strength and endurance to perform  home mnagement/ cooking activities in standing for 1 hour prior to rest break.    Time 8   Period Weeks   Status New   OT LONG TERM GOAL #4   Title Pt will demonstrate ability to perfom a moderate complex organization task with 95% or greater accuracy.   Time 8   Period Weeks   Status New               Plan - 01/26/15 0034    Clinical Impression Statement Pt was cleared by MD office to return to therapy following ablation. Pt demonstrates progress towards goals with improved attention to details today for moderate level word problems.   Pt will benefit from skilled therapeutic intervention in order to improve on the following deficits (Retired) Decreased endurance;Decreased activity tolerance;Impaired UE functional use;Decreased cognition;Decreased mobility;Decreased strength   Rehab Potential Good   OT Frequency 2x / week   OT Duration 8 weeks   OT Treatment/Interventions Self-care/ADL training;Fluidtherapy;Moist Heat;DME and/or AE instruction;Patient/family education;Therapeutic exercises;Ultrasound;Therapeutic exercise;Therapeutic activities;Cognitive remediation/compensation;Passive range of motion;Neuromuscular education;Electrical Stimulation;Visual/perceptual remediation/compensation;Cryotherapy;Parrafin   Plan continue to address divided attention, deductive reasoning   Consulted and Agree with Plan of Care Patient        Problem List Patient Active Problem List   Diagnosis Date Noted  . Coarctation of aorta, recurrent, post-intervention   . Hypokalemia   . 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 Atrial flutter 11/16/2014  . Chest pain on respiration 10/07/2011  . ASTHMA, UNSPECIFIED, UNSPECIFIED STATUS 05/30/2009  . OTHER OSTEOPOROSIS 05/30/2009  . MITRAL VALVE REPLACEMENT, HX OF 05/30/2009    Bettyjean Stefanski 01/26/2015, 4:54 PM Theone Murdoch, OTR/L [phone] Phone: [phone] 4:54 PM 01/26/2015 [facility] [address] [place], [place], [address] Phone: [phone]   Fax:  [phone]

## 2015-01-26 NOTE — Therapy (Signed)
[facility] [address] [place], [place], [address] Phone: [phone]   Fax:  [phone]  Speech Language Pathology Treatment  Patient Details  Name: Martha Clark MRN: [id number] Date of Birth: Apr 12, 1956 Referring Provider:  Merri Brunette, MD  Encounter Date: 01/26/2015      End of Session - 01/26/15 1537    Visit Number 10   Number of Visits 17   Date for SLP Re-Evaluation 02/08/15   Authorization - Visit Number 9   Authorization - Number of Visits 50   SLP Start Time 1451   SLP Stop Time  1533   SLP Time Calculation (min) 42 min   Activity Tolerance Patient tolerated treatment well      Past Medical History  Diagnosis Date  . Atrial fibrillation   . Asthma   . Osteoporosis   . Mitral valve prolapse   . History of CVA (cerebrovascular accident) 11/20/14  . 1St degree AV block   . Typical atrial flutter     Past Surgical History  Procedure Laterality Date  . Tubal ligation    . Mitral valve replacement  2008  . Radiology with anesthesia N/A 11/20/2014    Procedure: RADIOLOGY WITH ANESTHESIA;  Surgeon: Julieanne Cotton, MD;  Location: [facility];  Service: Radiology;  Laterality: N/A;  . Electrophysiologic study N/A 01/17/2015    Procedure: A-Flutter Ablation;  Surgeon: Marinus Maw, MD;  Location: [facility] INV[facility];  Service: Cardiovascular;  Laterality: N/A;    There were no vitals filed for this visit.  Visit Diagnosis: Cognitive communication deficit      Subjective Assessment - 01/26/15 1500    Subjective "Today is the first day I've felt most like myself."               ADULT SLP TREATMENT - 01/26/15 1501    General Information   Behavior/Cognition Alert;Cooperative;Pleasant mood   Treatment Provided   Treatment provided Cognitive-Linquistic   Pain Assessment   Pain Assessment No/denies pain   Cognitive-Linquistic Treatment   Treatment focused on Cognition   Skilled  Treatment SLP presented pt with divided attention tasks in order to prepare her cognitive-linguistic skills for eventual hopeful return to work. Pt was 63% successful with rare min A  in auditory stimuli (commercials). She iinstead focused her attention on written task.DIscussion rE: divided attention with pt and how therapy tasks have mimicked what she may have to do at work (look at spreadsheet and attend to side conversation re: that spreadsheet).    Assessment / Recommendations / Plan   Plan Continue with current plan of care   Progression Toward Goals   Progression toward goals Progressing toward goals            SLP Short Term Goals - 01/06/15 1054    SLP SHORT TERM GOAL #1   Title pt to ID errors in high level cognitive linguistic tasks 100%   Time 1   Status Achieved   SLP SHORT TERM GOAL #2   Title pt will divide attention with 90% success on both tasks over 25 mintues   Time 1   Period Weeks   Status On-going          SLP Long Term Goals - 01/11/15 1107    SLP LONG TERM GOAL #1   Title pt will ID errors in complex cognitive linguistic tasks 100% with self-correction   Time 5   Period Weeks   Status On-going   SLP  LONG TERM GOAL #2   Title pt will divide attention in 35 minutes complex cognitive linguistic tasks with 100% success with self-correction   Time 5   Period Weeks   Status On-going          Plan - 01/26/15 1537    Clinical Impression Statement Pt's attention (divided) skills with mod difficult written task and min-mod difficult auditory task was completed with almost 100% alternating attention. Pt's anticipatory awareness remains a relative strength as pt cont to tell SLP how she will need to modify environment when she returns to work (half days to start).   Speech Therapy Frequency 2x / week   Duration 4 weeks   Treatment/Interventions SLP instruction and feedback;Compensatory strategies;Patient/family education;Functional tasks;Cueing  hierarchy;Cognitive reorganization   Potential to Achieve Goals Good        Problem List Patient Active Problem List   Diagnosis Date Noted  . Coarctation of aorta, recurrent, post-intervention   . Hypokalemia   . 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 Atrial flutter 11/16/2014  . Chest pain on respiration 10/07/2011  . ASTHMA, UNSPECIFIED, UNSPECIFIED STATUS 05/30/2009  . OTHER OSTEOPOROSIS 05/30/2009  . MITRAL VALVE REPLACEMENT, HX OF 05/30/2009    [facility] , MS, CCC-SLP  01/26/2015, 3:38 PM  [facility] [address] [place], [place], [address] Phone: [phone]   Fax:  [phone]

## 2015-01-26 NOTE — Telephone Encounter (Signed)
Spoke with Martha Clark and let her know a note is in Epic.  She was appreciative of my call

## 2015-01-26 NOTE — Telephone Encounter (Signed)
Follow up      Pt has a neuro rehab appt today at 9:30.  Need verbal order to resume OT and speech therapy for appt today

## 2015-01-26 NOTE — Telephone Encounter (Signed)
F/u  Pt requested to speak w/ RN- pt stated that Neuro rehab needs faxed information from Dr Ladona Ridgel before clearance can be given. Pt requested to speak w/ RN to discuss this; preferably before her 9:30 neuro appt this morning (8/31). Please call back and discuss.

## 2015-01-26 NOTE — Patient Instructions (Signed)
  Please complete the assigned speech therapy homework and return it to your next session.  

## 2015-01-26 NOTE — Telephone Encounter (Signed)
Deliah Boston, RN at 01/25/2015 3:04 PM     Status: Signed       Expand All Collapse All   Discussed with Dr Ladona Ridgel and she is okay to go to speech and OT. Patient aware

## 2015-01-27 ENCOUNTER — Encounter: Payer: Self-pay | Admitting: Neurology

## 2015-01-27 ENCOUNTER — Ambulatory Visit (INDEPENDENT_AMBULATORY_CARE_PROVIDER_SITE_OTHER): Payer: BLUE CROSS/BLUE SHIELD | Admitting: Neurology

## 2015-01-27 ENCOUNTER — Ambulatory Visit: Payer: BLUE CROSS/BLUE SHIELD | Attending: Psychology | Admitting: Psychology

## 2015-01-27 VITALS — BP 101/65 | HR 56 | Ht 67.5 in | Wt 121.6 lb

## 2015-01-27 DIAGNOSIS — I6931 Cognitive deficits following cerebral infarction: Secondary | ICD-10-CM | POA: Diagnosis not present

## 2015-01-27 DIAGNOSIS — I6340 Cerebral infarction due to embolism of unspecified cerebral artery: Secondary | ICD-10-CM | POA: Diagnosis not present

## 2015-01-27 DIAGNOSIS — R41841 Cognitive communication deficit: Secondary | ICD-10-CM | POA: Insufficient documentation

## 2015-01-27 DIAGNOSIS — R6889 Other general symptoms and signs: Secondary | ICD-10-CM | POA: Insufficient documentation

## 2015-01-27 DIAGNOSIS — R531 Weakness: Secondary | ICD-10-CM | POA: Insufficient documentation

## 2015-01-27 DIAGNOSIS — I69319 Unspecified symptoms and signs involving cognitive functions following cerebral infarction: Secondary | ICD-10-CM

## 2015-01-27 DIAGNOSIS — I69898 Other sequelae of other cerebrovascular disease: Secondary | ICD-10-CM | POA: Insufficient documentation

## 2015-01-27 DIAGNOSIS — I639 Cerebral infarction, unspecified: Secondary | ICD-10-CM

## 2015-01-27 NOTE — Progress Notes (Signed)
[facility]  [address]   Telephone [phone] [address] Fax [phone] [place], [place] [address]  Initial Contact Note  Name:  Martha Clark Date of Birth; 1955/09/17 MRN:  [id number] Date:  01/27/2015  Martha Clark is an 59 y.o. female with a history of R MCA stroke in June 2016 who was referred for neuropsychological evaluation by Delia Heady, MD to evaluate her cognitive functioning.   A total of 5 hours was spent today reviewing medical records, interviewing (CPT [phone]) [name] and administering and scoring neurocognitive tests (CPT [phone] & [phone]).  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   A full report will follow once the planned testing has been completed. Her next appointment is scheduled for 02/01/15.   Gladstone Pih, Ph.D Licensed Psychologist 01/27/2015

## 2015-01-27 NOTE — Progress Notes (Signed)
[facility] [address] [place].  [address] [phone]       OFFICE FOLLOW UP VISIT  NOTE  Martha. Martha Clark Date of Birth:  03-01-1956 Medical Record Number:  [id number]   Referring MD:  Birdie Sons Reason for Referral:  Stroke f/u  HPI:Martha Clark is a 59 year old Caucasian lady seen today for the first office follow-up visit following hospital admission for stroke in June 2016.Martha Clark is a 59 y.o. female with a past medical history significant for MVP s/p bovine mitral valve replacement, newly diagnosed atrial fibrillation s/p cardioversion in he ER on 6/21, osteoporosis, and asthma, brought in via EMS due to acute onset of left hemiparesis, dysarthria, and left facial weakness. Patient was home with family when she fell and called out for her family in the other room and noted patient to have left side paralysis with facial droop and slurred speech. EMS was immediately summoned and patient brought to the ED. Awake and alert at initial evaluation, with NIHSS 15 and CT scan showed no acute abnormality. She denied HA, vertigo, double vision, difficulty swallowing, or visual disturbances. No recent bleeding or surgeries. Patient not taking anticoagulants. NIHSS: 15. She was last known well 11/21/13 at 1655. She was administered IV tPA (50 mg on 11/20/2014 at 1800) followed by neurointervention with IA tPA and Solitaire device leading to TICI 3 revascularization. She was admitted to neuro ICU post procedure for further evaluation and treatement. She did well after the procedure and was extubated. She had mild left-sided weakness and dysmetria which in improve during her hospital stay. MRI scan of the brain showed patchy right basal ganglia infarct as well as smaller portions of frontal lobe. Transthoracic echo showed no cortex as well as embolism and tissue mitral valve was in good position. LDL cholesterol was 76 and hemoglobin A1c was 5.3. She was on aspirin  prior to admission given history of recent atrial fibrillation she was changed to eliquis. She did so well that she was discharged home with home physical and occupational therapy. She states she has continued to do well and physically has practically no deficits. Uneven she smiles she notices slight left facial asymmetry and some diminished fine motor skills in the left hand. She did feel a lot of fatigue and tiredness and felt like her mind was in a fog had trouble concentrating but after undergoing cardiac ablation for atrial flutter for the last week she feels a lot better like she is a different person. She's had a few transient episodes of blurred vision once while shopping in a grocery store when she felt lightheaded and woozy but only for a few seconds. On another occasion this happened when she was walking but felt better when she sat down. Her blood pressure has been running quite low in the 100 range and heart rate has been in the 50s. She has appointment to see Dr. Winfred Burn neuropsychologist later today for detailed testing. On Mini-Mental status testing in the office today she scored 29/30 with only one deficit in recall. She was able to name only 8 animals. She wants to go back to work but is willing to wait for neuropsych testing results to come back.  ROS:   14 system review of systems is positive for dizziness, constipation, blurred vision, lightheaded, cold, sweating and all other systems negative  PMH:  Past Medical History  Diagnosis Date  . Atrial fibrillation   . Asthma   . Osteoporosis   . Mitral  valve prolapse   . History of CVA (cerebrovascular accident) 11/20/14  . 1St degree AV block   . Typical atrial flutter     Social History:  Social History   Social History  . Marital Status: Married    Spouse Name: N/A  . Number of Children: N/A  . Years of Education: N/A   Occupational History  . Not on file.   Social History Main Topics  . Smoking status: Never Smoker     . Smokeless tobacco: Not on file  . Alcohol Use: No  . Drug Use: No  . Sexual Activity: Not Currently    Birth Control/ Protection: None   Other Topics Concern  . Not on file   Social History Narrative    Medications:   Current Outpatient Prescriptions on File Prior to Visit  Medication Sig Dispense Refill  . acetaminophen (TYLENOL) 325 MG tablet Take 1,300 mg by mouth every 6 (six) hours as needed for mild pain.    Marland Kitchen apixaban (ELIQUIS) 5 MG TABS tablet Take 1 tablet (5 mg total) by mouth 2 (two) times daily. 60 tablet 12  . buPROPion (WELLBUTRIN XL) 150 MG 24 hr tablet Take 150 mg by mouth Daily.     . calcium citrate-vitamin D (CITRACAL+D) 315-200 MG-UNIT per tablet Take 1 tablet by mouth daily.      . Cholecalciferol (VITAMIN D) 1000 UNITS capsule Take 1,000 Units by mouth daily.      . Coenzyme Q10 (COQ-10) 100 MG CAPS Take 1 tablet by mouth daily.      . Fish Oil OIL Take 15 mLs by mouth daily.     Marland Kitchen MAGNESIUM CHLORIDE PO Take 1 tablet by mouth daily.      . metoprolol tartrate (LOPRESSOR) 25 MG tablet Take 1 tablet (25 mg total) by mouth 2 (two) times daily. 60 tablet 2  . Multiple Vitamin (MULTIVITAMIN WITH MINERALS) TABS tablet Take 1 tablet by mouth daily.    . Multiple Vitamins-Minerals (EYE VITAMINS PO) Take 1 tablet by mouth daily.    . vitamin C (ASCORBIC ACID) 500 MG tablet Take 500 mg by mouth daily.     No current facility-administered medications on file prior to visit.    Allergies:  No Known Allergies  Physical Exam General: well developed, well nourished middle-aged Caucasian lady, seated, in no evident distress Head: head normocephalic and atraumatic.   Neck: supple with no carotid or supraclavicular bruits Cardiovascular: regular rate and rhythm, no murmurs Musculoskeletal: no deformity Skin:  no rash/petichiae Vascular:  Normal pulses all extremities  Neurologic Exam Mental Status: Awake and fully alert. Oriented to place and time. Recent and remote  memory intact. Attention span, concentration and fund of knowledge appropriate. Mood and affect appropriate. Mini-Mental status exam scored 29/30 with one deficit in recall only. Animal naming test 8. Clock drawing 4/4. Geriatric depression scale 1 only not depressed. Cranial Nerves: Fundoscopic exam reveals sharp disc margins. Pupils equal, briskly reactive to light. Extraocular movements full without nystagmus. Visual fields full to confrontation. Hearing intact. Mild left lower facial asymmetry when she smiles. Facial sensation intact. Face, tongue, palate moves normally and symmetrically.  Motor: Normal bulk and tone. Normal strength in all tested extremity muscles. Diminished fine finger movements on the left. Mild left grip weakness. Orbits right over left approximately. Sensory.: intact to touch , pinprick , position and vibratory sensation.  Coordination: Rapid alternating movements normal in all extremities. Finger-to-nose and heel-to-shin performed accurately bilaterally. Gait and Station: Arises from  chair without difficulty. Stance is normal. Gait demonstrates normal stride length and balance . Able to heel, toe and tandem walk without difficulty.  Reflexes: 1+ and symmetric. Toes downgoing.   NIHSS  1 Modified Rankin  2   ASSESSMENT: 82 year Caucasian lady with embolic right MCA infarct in June 2016 secondary to a atrial flutter treated with IV TPA and complete recanalization  with intra-arterial TPA and mechanical embolectomy with solitaire device. She has done remarkably well with excellent physical recovery but has mild cognitive complaints    PLAN: I had a long d/w patient about her recent stroke, risk for recurrent stroke/TIAs, personally independently reviewed imaging studies and stroke evaluation results and answered questions.Continue Eliquis  for secondary stroke prevention and maintain strict control of hypertension with blood pressure goal below 130/90, diabetes with  hemoglobin A1c goal below 6.5% and lipids with LDL cholesterol goal below 70 mg/dL. I also advised the patient to eat a healthy diet with plenty of whole grains, cereals, fruits and vegetables, exercise regularly and maintain ideal body weight .she was advised to keep her appointment with neuropsych testing today and will make a final decision about returning to work after the results in a few weeks. Followup in the future with me in 3 months or call earlier if necessary Delia Heady, MD  Note: This document was prepared with digital dictation and possible smart phrase technology. Any transcriptional errors that result from this process are unintentional.

## 2015-01-27 NOTE — Patient Instructions (Signed)
I had a long d/w patient about her recent stroke, risk for recurrent stroke/TIAs, personally independently reviewed imaging studies and stroke evaluation results and answered questions.Continue Eliquis  for secondary stroke prevention and maintain strict control of hypertension with blood pressure goal below 130/90, diabetes with hemoglobin A1c goal below 6.5% and lipids with LDL cholesterol goal below 70 mg/dL. I also advised the patient to eat a healthy diet with plenty of whole grains, cereals, fruits and vegetables, exercise regularly and maintain ideal body weight .she was advised to keep her appointment with neuropsych testing today and will make a final decision about returning to work after the results in a few weeks. Followup in the future with me in 3 months or call earlier if necessary  Stroke Prevention Some medical conditions and behaviors are associated with an increased chance of having a stroke. You may prevent a stroke by making healthy choices and managing medical conditions. HOW CAN I REDUCE MY RISK OF HAVING A STROKE?   Stay physically active. Get at least 30 minutes of activity on most or all days.  Do not smoke. It may also be helpful to avoid exposure to secondhand smoke.  Limit alcohol use. Moderate alcohol use is considered to be:  No more than 2 drinks per day for men.  No more than 1 drink per day for nonpregnant women.  Eat healthy foods. This involves:  Eating 5 or more servings of fruits and vegetables a day.  Making dietary changes that address high blood pressure (hypertension), high cholesterol, diabetes, or obesity.  Manage your cholesterol levels.  Making food choices that are high in fiber and low in saturated fat, trans fat, and cholesterol may control cholesterol levels.  Take any prescribed medicines to control cholesterol as directed by your health care provider.  Manage your diabetes.  Controlling your carbohydrate and sugar intake is recommended  to manage diabetes.  Take any prescribed medicines to control diabetes as directed by your health care provider.  Control your hypertension.  Making food choices that are low in salt (sodium), saturated fat, trans fat, and cholesterol is recommended to manage hypertension.  Take any prescribed medicines to control hypertension as directed by your health care provider.  Maintain a healthy weight.  Reducing calorie intake and making food choices that are low in sodium, saturated fat, trans fat, and cholesterol are recommended to manage weight.  Stop drug abuse.  Avoid taking birth control pills.  Talk to your health care provider about the risks of taking birth control pills if you are over 74 years old, smoke, get migraines, or have ever had a blood clot.  Get evaluated for sleep disorders (sleep apnea).  Talk to your health care provider about getting a sleep evaluation if you snore a lot or have excessive sleepiness.  Take medicines only as directed by your health care provider.  For some people, aspirin or blood thinners (anticoagulants) are helpful in reducing the risk of forming abnormal blood clots that can lead to stroke. If you have the irregular heart rhythm of atrial fibrillation, you should be on a blood thinner unless there is a good reason you cannot take them.  Understand all your medicine instructions.  Make sure that other conditions (such as anemia or atherosclerosis) are addressed. SEEK IMMEDIATE MEDICAL CARE IF:   You have sudden weakness or numbness of the face, arm, or leg, especially on one side of the body.  Your face or eyelid droops to one side.  You have  sudden confusion.  You have trouble speaking (aphasia) or understanding.  You have sudden trouble seeing in one or both eyes.  You have sudden trouble walking.  You have dizziness.  You have a loss of balance or coordination.  You have a sudden, severe headache with no known cause.  You have  new chest pain or an irregular heartbeat. Any of these symptoms may represent a serious problem that is an emergency. Do not wait to see if the symptoms will go away. Get medical help at once. Call your local emergency services (911 in [place] Do not drive yourself to the hospital. Document Released: 06/21/2004 Document Revised: 09/28/2013 Document Reviewed: 11/14/2012 [facility] Patient Information 2015 [place], [place]. This information is not intended to replace advice given to you by your health care provider. Make sure you discuss any questions you have with your health care provider.

## 2015-01-28 ENCOUNTER — Telehealth: Payer: Self-pay

## 2015-01-28 ENCOUNTER — Ambulatory Visit: Payer: BLUE CROSS/BLUE SHIELD

## 2015-01-28 DIAGNOSIS — R41841 Cognitive communication deficit: Secondary | ICD-10-CM

## 2015-01-28 DIAGNOSIS — I69898 Other sequelae of other cerebrovascular disease: Secondary | ICD-10-CM | POA: Diagnosis present

## 2015-01-28 DIAGNOSIS — R6889 Other general symptoms and signs: Secondary | ICD-10-CM | POA: Diagnosis present

## 2015-01-28 DIAGNOSIS — I6931 Cognitive deficits following cerebral infarction: Secondary | ICD-10-CM | POA: Diagnosis present

## 2015-01-28 DIAGNOSIS — R531 Weakness: Secondary | ICD-10-CM | POA: Diagnosis present

## 2015-01-28 NOTE — Telephone Encounter (Signed)
LF vm for pt that Dr Pearlean Brownie stated its okay for her to travel. Also pts letter for extension for disability and work was sent.

## 2015-01-28 NOTE — Therapy (Signed)
[facility] [address] [place], [place], [address] Phone: [phone]   Fax:  [phone]  Speech Language Pathology Treatment  Patient Details  Name: Martha Clark MRN: [id number] Date of Birth: 27-Jan-1956 Referring Provider:  Merri Brunette, MD  Encounter Date: 01/28/2015      End of Session - 01/28/15 0945    Visit Number 11   Number of Visits 17   Date for SLP Re-Evaluation 02/08/15   Authorization - Visit Number 10   Authorization - Number of Visits 50   SLP Start Time 0807   SLP Stop Time  0846   SLP Time Calculation (min) 39 min   Activity Tolerance Patient tolerated treatment well      Past Medical History  Diagnosis Date  . Atrial fibrillation   . Asthma   . Osteoporosis   . Mitral valve prolapse   . History of CVA (cerebrovascular accident) 11/20/14  . 1St degree AV block   . Typical atrial flutter     Past Surgical History  Procedure Laterality Date  . Tubal ligation    . Mitral valve replacement  2008  . Radiology with anesthesia N/A 11/20/2014    Procedure: RADIOLOGY WITH ANESTHESIA;  Surgeon: Julieanne Cotton, MD;  Location: [facility] OR;  Service: Radiology;  Laterality: N/A;  . Electrophysiologic study N/A 01/17/2015    Procedure: A-Flutter Ablation;  Surgeon: Marinus Maw, MD;  Location: [facility] INVASIVE CV LAB;  Service: Cardiovascular;  Laterality: N/A;    There were no vitals filed for this visit.  Visit Diagnosis: Cognitive communication deficit      Subjective Assessment - 01/28/15 0811    Subjective Pt had neuropsych testing yesterday               ADULT SLP TREATMENT - 01/28/15 0812    General Information   Behavior/Cognition Alert;Cooperative;Pleasant mood   Treatment Provided   Treatment provided Cognitive-Linquistic   Pain Assessment   Pain Assessment No/denies pain   Cognitive-Linquistic Treatment   Treatment focused on Cognition   Skilled Treatment Divided  attention tasks completed today for improving pt's cognitive linguistic skills for eventual return to work. Pt with quicker responses than pre-surgery. Success with each task was 100%, with divided attention occurring 58% and alternating 42%.   Assessment / Recommendations / Plan   Plan Continue with current plan of care   Progression Toward Goals   Progression toward goals Progressing toward goals            SLP Short Term Goals - 01/06/15 1054    SLP SHORT TERM GOAL #1   Title pt to ID errors in high level cognitive linguistic tasks 100%   Time 1   Status Achieved   SLP SHORT TERM GOAL #2   Title pt will divide attention with 90% success on both tasks over 25 mintues   Time 1   Period Weeks   Status On-going          SLP Long Term Goals - 01/28/15 0949    SLP LONG TERM GOAL #1   Title pt will ID errors in complex cognitive linguistic tasks 100% with self-correction   Time 4   Period Weeks   Status On-going   SLP LONG TERM GOAL #2   Title pt will divide attention in 35 minutes complex cognitive linguistic tasks with 100% success with self-correction   Time 4   Period Weeks   Status On-going  Plan - 01/28/15 0947    Clinical Impression Statement Pt performed better today with divided attention tasks and her thinking has been quicker in cognitive-lingjuistic activities in general. Suspect pt will need 1-3 more weeks of skilled ST to focus on divided attention skills in tasks of cognitive linguistics.   Speech Therapy Frequency 2x / week   Duration 4 weeks   Treatment/Interventions SLP instruction and feedback;Compensatory strategies;Patient/family education;Functional tasks;Cueing hierarchy;Cognitive reorganization   Potential to Achieve Goals Good        Problem List Patient Active Problem List   Diagnosis Date Noted  . Coarctation of aorta, recurrent, post-intervention   . Hypokalemia   . Embolic cerebral infarction s/p revascularization, d/t atrial  fib from tissue valve 11/20/2014  . Atrial flutter 11/16/2014  . Chest pain on respiration 10/07/2011  . ASTHMA, UNSPECIFIED, UNSPECIFIED STATUS 05/30/2009  . OTHER OSTEOPOROSIS 05/30/2009  . MITRAL VALVE REPLACEMENT, HX OF 05/30/2009    [facility] , MS, CCC-SLP  01/28/2015, 9:51 AM  [facility] [address] [place], [place], [address] Phone: [phone]   Fax:  [phone]

## 2015-02-01 ENCOUNTER — Ambulatory Visit (INDEPENDENT_AMBULATORY_CARE_PROVIDER_SITE_OTHER): Payer: BLUE CROSS/BLUE SHIELD | Admitting: Psychology

## 2015-02-01 ENCOUNTER — Telehealth: Payer: Self-pay | Admitting: Neurology

## 2015-02-01 ENCOUNTER — Encounter: Payer: Self-pay | Admitting: Psychology

## 2015-02-01 ENCOUNTER — Ambulatory Visit: Payer: BLUE CROSS/BLUE SHIELD

## 2015-02-01 ENCOUNTER — Ambulatory Visit: Payer: BLUE CROSS/BLUE SHIELD | Admitting: Occupational Therapy

## 2015-02-01 DIAGNOSIS — R41841 Cognitive communication deficit: Secondary | ICD-10-CM

## 2015-02-01 DIAGNOSIS — I6931 Cognitive deficits following cerebral infarction: Secondary | ICD-10-CM

## 2015-02-01 DIAGNOSIS — I69319 Unspecified symptoms and signs involving cognitive functions following cerebral infarction: Secondary | ICD-10-CM

## 2015-02-01 DIAGNOSIS — IMO0002 Reserved for concepts with insufficient information to code with codable children: Secondary | ICD-10-CM

## 2015-02-01 DIAGNOSIS — R6889 Other general symptoms and signs: Secondary | ICD-10-CM

## 2015-02-01 NOTE — Telephone Encounter (Signed)
Martha Clark with [employer] called requesting clarification for letter dated 01/28/15. She sent a request on 01/25/15 which was returned with letter dated 01/28/15. She needs clarification on the severity of cognitive impairment and what her neuropsyche testing results were. They are looking for restrictions and limitations for work and what the treatment plan is.What is estimated date to return to work. Please call and advise. She can be reached at [phone]. Pt claim number is [id number].

## 2015-02-01 NOTE — Therapy (Signed)
[facility] [address] [place], [place], [address] Phone: [phone]   Fax:  [phone]  Speech Language Pathology Treatment  Patient Details  Name: Martha Clark MRN: [id number] Date of Birth: Feb 03, 1956 Referring Provider:  Merri Brunette, MD  Encounter Date: 02/01/2015      End of Session - 02/01/15 1715    Visit Number 12   Number of Visits 17   Date for SLP Re-Evaluation 02/08/15   Authorization - Visit Number 11   Authorization - Number of Visits 50   SLP Start Time 0804   SLP Stop Time  0845   SLP Time Calculation (min) 41 min   Activity Tolerance Patient tolerated treatment well      Past Medical History  Diagnosis Date  . Atrial fibrillation   . Asthma   . Osteoporosis   . Mitral valve prolapse   . History of CVA (cerebrovascular accident) 11/20/14  . 1St degree AV block   . Typical atrial flutter     Past Surgical History  Procedure Laterality Date  . Tubal ligation    . Mitral valve replacement  2008  . Radiology with anesthesia N/A 11/20/2014    Procedure: RADIOLOGY WITH ANESTHESIA;  Surgeon: Julieanne Cotton, MD;  Location: [facility] OR;  Service: Radiology;  Laterality: N/A;  . Electrophysiologic study N/A 01/17/2015    Procedure: A-Flutter Ablation;  Surgeon: Marinus Maw, MD;  Location: [facility] INVASIVE CV LAB;  Service: Cardiovascular;  Laterality: N/A;    There were no vitals filed for this visit.  Visit Diagnosis: Cognitive communication deficit      Subjective Assessment - 02/01/15 1717    Subjective Pt cooked, and had conversation simultaneously on Saturday night with friends. Pt plan to return to work part time 02-14-15.               ADULT SLP TREATMENT - 02/01/15 0809    General Information   Behavior/Cognition Alert;Cooperative;Pleasant mood   Treatment Provided   Treatment provided Cognitive-Linquistic   Pain Assessment   Pain Assessment No/denies pain   Cognitive-Linquistic Treatment   Treatment focused on Cognition   Skilled Treatment SLP focused todays session on divided attention in order to prepare pt for work and community activities using those skills. Pt was successful with divided attention 55% of the time and demo'd alternating attention 45% of the time. She self corrected 1/3 times. In a novel game, pt req'd mod cues usually for remembering rules.   Assessment / Recommendations / Plan   Plan Continue with current plan of care   Progression Toward Goals   Progression toward goals Progressing toward goals            SLP Short Term Goals - 01/06/15 1054    SLP SHORT TERM GOAL #1   Title pt to ID errors in high level cognitive linguistic tasks 100%   Time 1   Status Achieved   SLP SHORT TERM GOAL #2   Title pt will divide attention with 90% success on both tasks over 25 mintues   Time 1   Period Weeks   Status On-going          SLP Long Term Goals - 02/01/15 1718    SLP LONG TERM GOAL #1   Title pt will ID errors in complex cognitive linguistic tasks 100% with self-correction   Time 3   Period Weeks   Status On-going   SLP LONG TERM GOAL #2   Title pt  will divide attention in 35 minutes complex cognitive linguistic tasks with 100% success with self-correction   Time 3   Period Weeks   Status On-going          Plan - 02/01/15 1715    Clinical Impression Statement Pt has seen that in novel tasks she does worse than in familiar tasks. SLP discussed ramificaitons of this with pt in the work setting. Pt's performance with divided attention is improving. Skilled ST needs to cont to maximize pt cognitive-linguistics to retun to premorbid activities including work.   Speech Therapy Frequency 2x / week   Duration --  3 weeks   Treatment/Interventions SLP instruction and feedback;Compensatory strategies;Patient/family education;Functional tasks;Cueing hierarchy;Cognitive reorganization   Potential to Achieve Goals Good         Problem List Patient Active Problem List   Diagnosis Date Noted  . Coarctation of aorta, recurrent, post-intervention   . Hypokalemia   . 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 Atrial flutter 11/16/2014  . Chest pain on respiration 10/07/2011  . ASTHMA, UNSPECIFIED, UNSPECIFIED STATUS 05/30/2009  . OTHER OSTEOPOROSIS 05/30/2009  . MITRAL VALVE REPLACEMENT, HX OF 05/30/2009    [facility] , MS, CCC-SLP  02/01/2015, 5:19 PM  [facility] [address] [place], [place], [address] Phone: [phone]   Fax:  [phone]

## 2015-02-01 NOTE — Progress Notes (Addendum)
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 Medical referral was obtained from Delia Heady, MD of [facility].   Sources of Information Electronic medical records from the [facility] were reviewed. Ms. Macha and her husband, Mr. Rebecah Dangerfield, were interviewed.    History of Illness Ms. Mehlhoff presented at the [facility] Emergency department on 11/16/14 with an acute onset of left hemiparesis, slurred speech and left facial weakness.  A brain MRI scan on 11/21/14 revealed an acute right basal ganglia infarction. She was diagnosed with a right middle cerebral artery embolic infarct secondary to her history of atrial fibrillation. She was hospitalized from 11/16/14 until 11/24/14. In early July 2016, she began participating in outpatient Occupational and Speech-Language Pathology therapies. It was noted upon initiation of services that she presented with reductions in activity tolerance, left upper extremity strength and complex attentional skills. She underwent cardiac ablation on 01/17/15.  Chief Complaints With regards to her cognitive functioning, she reported a  dramatic improvement in her ability to focus and concentrate ("like coming out of a fog") as well as higher energy level subsequent to undergoing cardiac ablation on 01/17/15. She currently reports residual cognitive changes of being less able to divide her attention (multi-tasking) and more prone to forget new verbal information than prior to her stroke. She denied any cognitive difficulties prior to her stroke.    Other problems or changes following her stroke have included slightly weaker left hand grip strength, reduced handwriting legibility and being more easily physically fatigued, particularly in the early evening. She did not cite any problems with balance, mobility or sensory-perceptual skills. She has been aware of showing less emotion and being less talkative than usual since her stroke, less so since her cardiac ablation. Although she has displayed less emotion on an ongoing basis, she reported that she has been prone to briefly cry since her stroke though only to emotionally provocative thoughts or events. She did not report experiencing underlying depression, anxiety or mood instability. She described herself as coping well with her situation. She did not report any current life stressors other than dealing with the aftermath of her stroke.   Her husband has observed her to appear with decreased facial animation, "muted" emotional reactions, reduced speech volume and prosody, and slowed processing speed. He has not perceived her to be depressed or anxious. He has not observed her to have exhibited any problems with distractibility, persistence to task, apathy, impulse control, social comportment or judgment  Background In addition to her history of atrial fibrillation and stroke, her past medical history was notable for asthma, rheumatic heart disease, mitral valve prolapse with mitral valve replacement in 2008 and osteoporosis.  Her current medications include acetaminophen, apixaban,  bupropion (initially prescribed in  2008 after she underwent mitral valve replacement) and metoprolol tartrate.  She denied history of developmental delays, head injury, seizure activity, neurological infection or exposure to neurotoxic substances. She reported no use of alcohol, illicit drugs or tobacco products.   She reported no history of emotional difficulties or mental health contacts prior to her stroke.  She reported no family history of psychiatric disorder. Her father had a stroke. Her mother has dementia that began in her late 43.   She lives with her husband of thirty-one years. They have a 54 year old son and a 30 year-old daughter (who was adopted). Their daughter lives at home.  Prior to her stroke, she was working as a Production manager. She stated that a large part of her job duties consists of managing a relatively autonomous employee team. She is also responsible for managing a budget and developing product lines from conception to store placement. She reported that she would typically work an eleven hour day. Her job has required some travel. She reported that she has been employed with the same company for the past twenty four years.  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 She denied history of any school-based attentional or learning problems.  Observations She appeared as an appropriately dressed and groomed slender woman in no apparent distress. She interacted in a consistently pleasant and cooperative manner. She spoke in a normal tone of voice, maintained good eye contact and responded to all questions. Her affect appeared constricted in range. She did not display signs of emotional distress. Her thought processes were coherent and organized without loose associations, verbal perseverations or flight of ideas. She was deemed to be a reliable informant. Her thought content was devoid of  unusual or bizarre ideas.   Evaluation Procedures In addition to review of medical records and interviews with Ms. Nieves and her husband, the following tests or questionnaires were administered:  Animal Naming Test Beck Depression Inventory-II Boston Naming Test Controlled Oral Word Association Test Finger Tapping Test Grooved Pegboard Test Hand Dynamometer Paced Auditory Serial Addition Test Rey Complex Figure: copy Ruff Figural Fluency Test Stroop Color and Word Test Trail Making A & B Wechsler Adult Intelligence Scale-IV:   Clinical cytogeneticist, Coding, Digit Span, Matrix Reasoning & Similarities   Wechsler Memory Scale- IV  Wide Range Achievement Test-4: Word Reading [place] Card Sorting Test  Assessment Results Test Validity & Interpretative Considerations Test results were deemed to represent a valid measure of her cognitive functioning. She appeared to consistently maintain alertness, sustain attention and persist to task. She did not exhibit signs of physical or emotional discomfort.  She did not report or display problems with vision (she wore her eyeglasses) or hearing. She seemed to comprehend task instructions without difficulty. There were no indications of careless or impulsive responding. She seemed to try her best.   Her premorbid intellectual potential was estimated to fall at least within the Average range based on her educational/vocational background coupled with a measure of word reading skill (Wide Range Achievement Test-4: Word Reading) that represents an over-learned verbal skill that is relatively resistant to the effects of neurological injury or illness.   A listing of her test scores can be found at the end of this report.   Speed of Processing & Attention Her performances on tests of processing speed varied from the Low Average to Average range. Her speed to transcribe symbols to match digits using a key (Wechsler  Adult Intelligence Scale- IV (WAIS-IV) Coding)  was within the Average range. Her speed to draw lines to connect numbers in sequence randomly arrayed on a page (Trails A) fell within the Low Average range. Her speed to read words or name color hues out loud (Stroop Color and Word Test) varied from Average to Low Average. Her sustained attention and information processing speed in the auditory channel, as assessed on a task that required continual mental adding of pairs of digits presented at various rates of speed (Paced Auditory Serial Addition Test), was within the Low Average range.   Her attentional capacity (working memory span) was predominantly within the Average range on untimed tasks that required her to repeat digits in reverse or ascending sequence (WAIS-IV Digit Span), immediately recognize series of symbols in left to right order (Wechsler Memory Scale-IV (WMS-IV) Symbol Span) or immediately recall spatial locations within a grid (WMS-IV Spatial Addition).  Learning & Memory A measure of her ability to learn and retain orally-presented information (WMS-IV Auditory Memory Index) was a relative strength within the High Average range. Her delayed recall of auditory information after an approximate twenty to thirty minute interval was either as expected or better than expected given her initial level of encoding. She demonstrated near flawless ability to recognize verbal information from yes/no questions or multiple choices.  Her ability to learn and retain visual information (WMS-IV Visual Memory Index) fell within the Low Average range. Her immediate visual memory was erratic as she performed within the Average range on a test of immediate memory for figural designs but within the impaired range on a subtest that was more dependent on spatial memory. Her delayed visual recall was as expected or better than expected given her initial level of encoding. This indicated that her visual memory was primarily limited at the stage of immediate recall.  Her ability to recognize visual information from multiple choices exceeded her spontaneous recall, which suggested that she can learn and retain more visual information than she can freely recall.   Overall, a composite measure of her delayed memory for both auditory and visual information (WMS-IV Delayed Memory Index) fell within the Average range. Her Delayed Memory Index significantly exceeded expectations based on her level of immediate recall, which indicated that not only was she able to adequately retain information, but that in some cases she benefitted from the extra time of the delay interval to more fully consolidate information into memory.  Executive functions  Executive functions comprise higher level attentional, organizational and reasoning processes that enable a person to successfully initiate, monitor, shift, plan and execute complex behavior. For the most part, she demonstrated either mild impairment or a mild decline from estimated pre-injury level on a variety of tasks that required mental flexibility, fluent production and/or problem-solving. Her speed on a complex visual sequencing task that required set shifting to maintain an alternating and ascending letter-number sequence (Trails B) fell within the Low Average range. Her ability to selectively allocate her attention in order to name the conflicting color a word was printed in while simultaneously ignoring reading the word (Stroop Color Word Test) was within the Average range, which suggested intact inhibitory control. Measures of fluent production were below expectations. Her abilities to produce words to designated letters (Controlled Oral Word Association Test) or generate unique designs (Ruff Figural Fluency Test) under time pressure were lower than expected within the Low Average range. Her ability to name members of a category (Animal Naming Test) was within the mildly impaired  range. Her performances on tests of abstract  reasoning that required either verbal classification of ostensibly different objects or ideas to a shared category (WAIS-IV Similarities) or matching of designs or symbols in an abstract manner (WAIS-IV Matrix Reasoning) fell within the Average range. Despite having the ability to form abstract concepts, she struggled to apply her reasoning ability on a novel problem-solving task that required using ongoing feedback to infer and systematically apply abstract rules to sort geometric designs [employer]). She made a very high number of errors, gave a high number of perseverative responses (which suggested that she tended to rigidly stick with an incorrect or previously correct strategy) and failed to show expected improvement in problem-solving efficiency as a function of time on task.  Language functioning  Qualitative observations did not reveal any problems for expressive communication or oral comprehension. Her ability to name to confrontation [facility] [employer]) was within normal expectations. As noted above, her ability to fluently generate words to a designated letter under time pressure (Controlled Oral Word Association Test) was within the Low Average range. Her ability to read words (Wide Range Achievement Test-4: Word Reading) was within the Average range.   Visual Perception & Visual-Spatial Organization   There were no signs of spatial inattention or problems with visual recognition. She performed within the Low Average range on a test of visuospatial organization that required assembly of two-dimensional block designs from models Counsellor). Her drawing of a spatially-complex geometric design Forensic psychologist) was normal.  Fine Motor skills She demonstrated consistent right hand preference. No problems with gross motor coordination were observed. Her fine motor speed (Finger Tapping Test) was within the High Average range for both hands. Her eye-hand dexterity,  as assessed by her speed to put grooved pegs into a formboard (Grooved Pegboard Test), was within the Average range for her right hand but within the mildly impaired range for her left hand. Her strength of grip (Hand Dynamometer) was mildly to moderately impaired for both hands.   Emotional Functioning  She did not report any problems with mood, behavior or psychosocial adjustment. Her score of 4 on the Beck Depression Inventory-II was within the normal or non-depressed range. No symptom was endorsed beyond a 1 on a 0 - 3 scale.     Summary & Conclusions Ladana Chavero is a 59 year-old right-handed woman who suffered a right basal ganglia infarct of embolic origin in June 2016.   Overall, on neuropsychological testing she demonstrated either mild impairment or decline from pre-injury level on measures of higher level attentional, organizational and problem-solving skills as well as on measures of left (non-dominant) hand motor function. More specifically, she performed within the mild range of impairment on measures of semantic fluency, problem-solving requiring conceptual flexibility, left hand manipulative dexterity speed and bi-manual grip strength. Her performances within the Low Average range on measures of information processing speed, set shifting efficiency, fluent production of designs or words, visual memory (specifically due to uneven immediate visual-spatial memory) and visual-spatial assembly were deemed to represent mild declines from her estimated pre-injury level. Her well-preserved abilities included learning and retaining auditory information (a relative strength within the High Average range), retention of visual information initially encoded, abstract concept formation, naming to confrontation, inhibitory control and bimanual fine motor speed.   Observations of this pleasant and cooperative woman were notable for her display of relatively minimal affect and animation. There were no  signs of distractibility, impulsivity or loss of task persistence.  There was no report or display of problems with mood, behavioral self-control or psychosocial adjustment.   From a functional perspective, her current level of neurocognitive functioning is more than adequate to allow for independent and goal-directed behavior in most everyday situations. Neuropsychological test results would predict that she might be slower to process information, shift her attention between more than one task at a time and find words while speaking as compared to before her stroke. She may have problems recalling spatially complex information. She might fail to generate alternatives or solutions to complex problems and lack sufficient mental flexibility to deal with non-routine situations. Her reduced facial affect might give others the misleading impression that she is depressed or disinterested.   The cognitive and motor changes identified on neuropsychological evaluation are attributed to her stroke. Given that it has been only 2 months since her stroke, she is likely to experience further cognitive recovery over the next several months.  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 Foody stated her goal to go back to work later this month. Her neuropsychological deficits would likely disrupt to some degree her efficiency and ability to perform some of her job responsibilities as a Production designer, theatre/television/film. She has greater potential to experience difficulties as the complexity of the job task and the number of simultaneous or rapidly occurring tasks that are involved are increased. Conversely, she would not be expected to have difficulties on tasks that are routine or inherently organized. The following suggestions are offered to foster her successful return to work:  Marland Kitchen She was advised to early on have a frank discussion with her supervisor about her residual cognitive changes and  deficits so expectations can be adjusted accordingly. (She will be given a copy of this report to assist her).  . It would be prudent for her to start back to work on a part-time basis, at least for a two week period, until she can ascertain her degree of mental stamina. . She will likely need more time to complete tasks than she did prior to her stroke. Multi-tasking should be limited as much as possible. It would be helpful for staff to submit questions or topics to her prior to scheduled meetings to allow her more time to generate ideas and organize her thoughts. She can bypass her reduced handwriting legibility by typing documents.  . She stated that a large part of her job is managing a relatively autonomous employee team. She would not be expected to have with any difficulties communicating her thoughts or getting along with co-workers. Her reduced facial affect and slowed response speed, however, may give others the false impression that she is depressed, disinterested or preoccupied.   The results and recommendations from this evaluation were discussed with Ms. Monfort on 02/01/15.   I have appreciated the opportunity to evaluate Ms. Hlavaty. Please feel free to contact me with any comments or questions.     __________________ Eula Flax, Ph.D Licensed Psychologist       Copy to: Ms. Ciria Bernardini (by request)        ADDENDUM-NEUROPSYCHOLOGICALTEST RESULTS  Her test scores were corrected to reflect norms for her age and whenever possible, her gender and educational level (i.e., 17 years).  Animal Naming Test Score= 16 7th  (adjusted for age, gender and educational level)    Boston Naming Test Score= 58/60 62nd (adjusted for age, gender and educational level)    Controlled Oral Word Association Test Score=  38 words/ 1 repetition 24th  (  adjusted for age, gender and educational level)    Finger Tapping R: 76 79th (adjusted for age, gender and educational  level)  L: 46.7 79th  (adjusted for age, gender and educational level)    Grooved Pegboard R: 66s 50th  (adjusted for age, gender and educational level)  L: 98s  7th  (adjusted for age, gender and educational level)    Veterinary surgeon R:  16.7 kg.  4th  (adjusted for age, gender and educational level)  L:   10   kg.  2nd  (adjusted for age, gender and educational level)    Paced Auditory Serial Addition Test Series 1: 33   series 2: 28   Series 3: 19   Series 4: 18   Total=      98 16th  (adjusted for age, race and educational level)    Rey Complex Figure: copy       Score= 34/36  Normal    Ruff Figural Fluency Test  Score Percentile Interpretative range  Total unique designs 50 39th  Low Average   Error Ratio .32 91st  Borderline      Stroop Color Word Test  Score Residual % (adjusted for age and educational level)  Word 100 -10 24th   Color   73 -6 31st    Color-Word   40 -3 38th      Trails A Score=  34s  0e 16th   (adjusted for age, gender and educational level)  Trails B Score=  76s  0e 14th  (adjusted for age, gender and educational level)    Wechsler Adult Intelligence Scale-IV   Subtest Scaled Score Percentile  Block Design   7 16th   Similarities 11 63rd    Digit Span  Forward               Backward               Sequencing 14 14 16 10  91st        91st  98th    50th     Matrix Reasoning 10 50th    Coding   10 50th      Wechsler Memory Scale-IV  Index Index Score Percentile  Immediate Memory   91 27th    Auditory Memory 115 84th     Visual Memory   85 16th     Delayed Memory  108 70th     Visual Working Memory    36 42nd      [employer] Test  Total errors= 62 2nd (adjusted for age and education)  Perseverative errors= 41 2nd     Categories= 3 6th - 10th    Trials to first category= 12 >16th   Failure to maintain set 2 >16th    Learning to learn= 16.11 2nd - 5th      Wide Range Achievement Test-4 Subtest  Raw score  Standard score Percentile  Word Reading 62/70 101 53rd

## 2015-02-01 NOTE — Therapy (Signed)
[facility] [address] [place], [place], [address] Phone: [phone]   Fax:  [phone]  Occupational Therapy Treatment  Patient Details  Name: Martha Clark MRN: [id number] Date of Birth: 1956-04-24 Referring Provider:  Deland Pretty, MD  Encounter Date: 02/01/2015      OT End of Session - 02/01/15 1300    Visit Number 12   Number of Visits [place], 60 visit combined, 76 remaining ?at time of eval   OT Start Time 0935   OT Stop Time 1015   OT Time Calculation (min) 40 min   Activity Tolerance Patient tolerated treatment well   Behavior During Therapy [facility] for tasks assessed/performed      Past Medical History  Diagnosis Date  . Atrial fibrillation   . Asthma   . Osteoporosis   . Mitral valve prolapse   . History of CVA (cerebrovascular accident) 11/20/14  . 1St degree AV block   . Typical atrial flutter     Past Surgical History  Procedure Laterality Date  . Tubal ligation    . Mitral valve replacement  2008  . Radiology with anesthesia N/A 11/20/2014    Procedure: RADIOLOGY WITH ANESTHESIA;  Surgeon: Luanne Bras, MD;  Location: [place];  Service: Radiology;  Laterality: N/A;  . Electrophysiologic study N/A 01/17/2015    Procedure: A-Flutter Ablation;  Surgeon: Evans Lance, MD;  Location: [place] [facility];  Service: Cardiovascular;  Laterality: N/A;    There were no vitals filed for this visit.  Visit Diagnosis:  Cognitive deficits following cerebral infarction  Weakness due to cerebrovascular accident  Decreased functional activity tolerance      Subjective Assessment - 02/01/15 0936    Pertinent History MVP s/p bovine mitral valve replacement, A-fib, (see EPIC snap shot for additional info)   Patient Stated Goals return to work   Currently in Pain? No/denies      Treatment: cooking task requiring divided attention between mashed potatoes and grilled cheese.  Pt  turned on the wrong burner and required verbal cues to correct, pt completed the reminder of task without cueing. Theraputic activities: Amb while performing physical task and cognitive task for category generation, min v.c./ difficulty. Ther ex:Arm bike x 6 mins level 3 for conditioning. Therapist discussed gentle strenghtening with pt using 2lbs weight for shoulder flex, abduct, overhead press, and biceps curls.                          OT Short Term Goals - 01/26/15 1010    OT SHORT TERM GOAL #1   Title I with HEP.   Baseline theraband with occasional min v.c.   Status Partially Met   OT SHORT TERM GOAL #2   Title Pt will increase LUE grip strength  to at least 25 lbs for increased functional use.   Baseline LUE 32 lbs   Status Achieved   OT SHORT TERM GOAL #3   Title Pt will demonstrate ability to perform a physical and cognitive task simultaneously with 80% or greater acccuracy and no greater than 1 LOB.   Status On-going   OT SHORT TERM GOAL #4   Title Pt will demonstrate adequate LUE strength and endurance to retrieve 5 lbs weight from overhead shelf x 5 reps without drops.   Status On-going   OT SHORT TERM GOAL #5   Title Pt will demonstrate ability to organize and complete a  mod complex cooking task, demonstrating good safety awareness, modified independently.   Baseline Performss however pt turned on wrong stove burner yet self corrected   Status On-going           OT Long Term Goals - 12/09/14 2005    OT LONG TERM GOAL #1   Title Pt will demonstrate LUE grip strength of 30 lbs or greater for increased functional use,   Baseline due 02/06/15   Time 8   Status New   OT LONG TERM GOAL #2   Title Pt will demonstrate ability to perform a physical and cognitive task simultaneously, with 95%  or better accuracy and no LOB.   Time 8   Period Weeks   Status New   OT LONG TERM GOAL #3   Title Pt will report adequate strength and endurance to perform   home mnagement/ cooking activities in standing for 1 hour prior to rest break.   Time 8   Period Weeks   Status New   OT LONG TERM GOAL #4   Title Pt will demonstrate ability to perfom a moderate complex organization task with 95% or greater accuracy.   Time 8   Period Weeks   Status New               Plan - 02/01/15 1259    Clinical Impression Statement Pt is progressing towards goals. She demonstrates continued decreased divided attention.   Pt will benefit from skilled therapeutic intervention in order to improve on the following deficits (Retired) Decreased endurance;Decreased activity tolerance;Impaired UE functional use;Decreased cognition;Decreased mobility;Decreased strength   Rehab Potential Good   OT Frequency 2x / week   OT Duration 8 weeks   OT Treatment/Interventions Self-care/ADL training;Fluidtherapy;Moist Heat;DME and/or AE instruction;Patient/family education;Therapeutic exercises;Ultrasound;Therapeutic exercise;Therapeutic activities;Cognitive remediation/compensation;Passive range of motion;Neuromuscular education;Electrical Stimulation;Visual/perceptual remediation/compensation;Cryotherapy;Parrafin   Plan divided attention, LUE strength and endurance   Consulted and Agree with Plan of Care Patient        Problem List Patient Active Problem List   Diagnosis Date Noted  . Coarctation of aorta, recurrent, post-intervention   . Hypokalemia   . 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 Atrial flutter 11/16/2014  . Chest pain on respiration 10/07/2011  . ASTHMA, UNSPECIFIED, UNSPECIFIED STATUS 05/30/2009  . OTHER OSTEOPOROSIS 05/30/2009  . MITRAL VALVE REPLACEMENT, HX OF 05/30/2009    RINE,KATHRYN 02/01/2015, 1:02 PM Theone [place], OTR/L [phone] Phone: [phone] 1:02 PM 02/01/2015 [facility] [address] [place],  [place], [address] Phone: [phone]   Fax:  [phone]

## 2015-02-03 ENCOUNTER — Ambulatory Visit: Payer: BLUE CROSS/BLUE SHIELD | Admitting: Occupational Therapy

## 2015-02-03 DIAGNOSIS — R6889 Other general symptoms and signs: Secondary | ICD-10-CM

## 2015-02-03 DIAGNOSIS — I69319 Unspecified symptoms and signs involving cognitive functions following cerebral infarction: Secondary | ICD-10-CM

## 2015-02-03 DIAGNOSIS — I6931 Cognitive deficits following cerebral infarction: Secondary | ICD-10-CM | POA: Diagnosis not present

## 2015-02-03 DIAGNOSIS — IMO0002 Reserved for concepts with insufficient information to code with codable children: Secondary | ICD-10-CM

## 2015-02-03 NOTE — Therapy (Signed)
[facility] [address] [place], [place], [address] Phone: [phone]   Fax:  [phone]  Occupational Therapy Treatment  Patient Details  Name: Martha Clark MRN: [id number] Date of Birth: April 29, 1956 Referring Provider:  Deland Pretty, MD  Encounter Date: 02/03/2015      OT End of Session - 02/03/15 1548    Visit Number 13   Number of Visits 17   Authorization Type [employer], 60 visit combined, 81 remaining ?at time of eval   OT Start Time 0935   OT Stop Time 1015   OT Time Calculation (min) 40 min      Past Medical History  Diagnosis Date  . Atrial fibrillation   . Asthma   . Osteoporosis   . Mitral valve prolapse   . History of CVA (cerebrovascular accident) 11/20/14  . 1St degree AV block   . Typical atrial flutter     Past Surgical History  Procedure Laterality Date  . Tubal ligation    . Mitral valve replacement  2008  . Radiology with anesthesia N/A 11/20/2014    Procedure: RADIOLOGY WITH ANESTHESIA;  Surgeon: Luanne Bras, MD;  Location: [facility];  Service: Radiology;  Laterality: N/A;  . Electrophysiologic study N/A 01/17/2015    Procedure: A-Flutter Ablation;  Surgeon: Evans Lance, MD;  Location: [place];  Service: Cardiovascular;  Laterality: N/A;    There were no vitals filed for this visit.  Visit Diagnosis:  Cognitive deficits following cerebral infarction  Weakness due to cerebrovascular accident  Decreased functional activity tolerance   Treatment: Self care: Pt had made notes following discussion with Dr. Valentina Shaggy regarding her neuropsych testing. Therapist discussed strategies with pt to address; decreased processing speed attentional deficits, decreased organization and overall decreased endurance, with suggestions regarding compensations. Pt wrote down information and she plans to discuss with her boss some accommodations that may benefical upon her  return.                 OT Treatments/Exercises (OP) - 02/03/15 0001    Exercises   Exercises Shoulder;Elbow   Shoulder Exercises: Standing   Flexion AROM;Both;10 reps   Shoulder Flexion Weight (lbs) 3 lbs   ABduction AROM;Both;10 reps;Weights   Shoulder ABduction Weight (lbs) 3 lbs   Elbow Exercises   Bar Weights/Barbell (Elbow Flexion) 3 lbs  10 reps both arms   Elbow Extension AROM;Both;Strengthening;10 reps;Standing   Bar Weights/Barbell (Elbow Extension) 3 lbs                  OT Short Term Goals - 01/26/15 1010    OT SHORT TERM GOAL #1   Title I with HEP.   Baseline theraband with occasional min v.c.   Status Partially Met   OT SHORT TERM GOAL #2   Title Pt will increase LUE grip strength  to at least 25 lbs for increased functional use.   Baseline LUE 32 lbs   Status Achieved   OT SHORT TERM GOAL #3   Title Pt will demonstrate ability to perform a physical and cognitive task simultaneously with 80% or greater acccuracy and no greater than 1 LOB.   Status On-going   OT SHORT TERM GOAL #4   Title Pt will demonstrate adequate LUE strength and endurance to retrieve 5 lbs weight from overhead shelf x 5 reps without drops.   Status On-going   OT SHORT TERM GOAL #5   Title Pt will demonstrate ability to organize and  complete a mod complex cooking task, demonstrating good safety awareness, modified independently.   Baseline Performss however pt turned on wrong stove burner yet self corrected   Status On-going           OT Long Term Goals - 02/03/15 1548    OT LONG TERM GOAL #1   Title Pt will demonstrate LUE grip strength of 30 lbs or greater for increased functional use,   Baseline due 02/11/15   Time 8   Status On-going   OT LONG TERM GOAL #2   Title Pt will demonstrate ability to perform a physical and cognitive task simultaneously, with 95%  or better accuracy and no LOB.   Time 8   Period Weeks   Status On-going   OT LONG TERM GOAL #3    Title Pt will report adequate strength and endurance to perform  home mnagement/ cooking activities in standing for 1 hour prior to rest break.   Time 8   Period Weeks   Status On-going   OT LONG TERM GOAL #4   Title Pt will demonstrate ability to perfom a moderate complex organization task with 95% or greater accuracy.   Time 8   Period Weeks   Status On-going               Plan - 02/03/15 1545    Clinical Impression Statement Pt is progressing towards goals. She anticipates return to work part time the week after next . Therapist discusssed strategies for compensating for cognitive deficits when returning to work.   Pt will benefit from skilled therapeutic intervention in order to improve on the following deficits (Retired) Decreased endurance;Decreased activity tolerance;Impaired UE functional use;Decreased cognition;Decreased mobility;Decreased strength   Rehab Potential Good   OT Frequency 2x / week   OT Duration 8 weeks   OT Treatment/Interventions Self-care/ADL training;Fluidtherapy;Moist Heat;DME and/or AE instruction;Patient/family education;Therapeutic exercises;Ultrasound;Therapeutic exercise;Therapeutic activities;Cognitive remediation/compensation;Passive range of motion;Neuromuscular education;Electrical Stimulation;Visual/perceptual remediation/compensation;Cryotherapy;Parrafin   Plan continue to address divided attention and LUE strength and endurance   Consulted and Agree with Plan of Care Patient        Problem List Patient Active Problem List   Diagnosis Date Noted  . Coarctation of aorta, recurrent, post-intervention   . Hypokalemia   . 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 Atrial flutter 11/16/2014  . Chest pain on respiration 10/07/2011  . ASTHMA, UNSPECIFIED, UNSPECIFIED STATUS 05/30/2009  . OTHER OSTEOPOROSIS 05/30/2009  . MITRAL VALVE REPLACEMENT, HX OF 05/30/2009     Amiree No 02/03/2015, 3:49 PM Theone [place], OTR/L [phone] Phone: [phone] 3:49 PM 02/03/2015 [facility] [address] [place], [place], [address] Phone: [phone]   Fax:  [phone]

## 2015-02-04 NOTE — Telephone Encounter (Signed)
Rn call [employer] for patients disability. Kendall in customer service stated they need more test, results, fax to them. Pt did fill out a release form so [employer] can have it. They also need a letter regarding restrictions for work and the treatment plan. Also more clarification for her cognitive impairment. Nurse gave fax number for form to be sent to. Number is  [phone]. Rn explain Dr.Sethi will be notified of this information needed.

## 2015-02-07 NOTE — Telephone Encounter (Signed)
Pt called and wanted to let Dr. Pearlean Brownie know that Dr. Evlyn Kanner will have a report ready for him by the end of the day. She also wanted to know how her paper was coming along dealing with [employer]. She has Sept 19th to go back to work part time. Please call and advise [phone]

## 2015-02-07 NOTE — Telephone Encounter (Signed)
Rn call patient to clarify letter for [employer] for her to return to work on 02-14-15 as part time. Pt stated she was seen by Dr. Zelson on 02-01-15 for a neuropsychological evaluation. Pt stated that Dr. Sethi needs to read the note by Dr. Zelson and agree to the date of her returning on February 14 2015 as part time for two weeks. Rn explain the message will be forward to Dr.Sethi. 

## 2015-02-08 ENCOUNTER — Ambulatory Visit: Payer: BLUE CROSS/BLUE SHIELD

## 2015-02-08 ENCOUNTER — Ambulatory Visit: Payer: BLUE CROSS/BLUE SHIELD | Admitting: Occupational Therapy

## 2015-02-08 DIAGNOSIS — I69319 Unspecified symptoms and signs involving cognitive functions following cerebral infarction: Secondary | ICD-10-CM

## 2015-02-08 DIAGNOSIS — I6931 Cognitive deficits following cerebral infarction: Secondary | ICD-10-CM | POA: Diagnosis not present

## 2015-02-08 NOTE — Telephone Encounter (Signed)
Information and letter and testing information fax to  [employer] [phone].Fax receive and confirm.

## 2015-02-08 NOTE — Telephone Encounter (Signed)
I spoke to the patient and reviewed neuropsych testing results. She still has mild impairment and multitasking and high level attentional and complex reasoning. She may return to work part-time starting September 19 for 2 weeks and then full-time as tolerated. We will fax a letter to her insurance company stating the same.

## 2015-02-08 NOTE — Therapy (Signed)
[facility] [address] [place], [place], [address] Phone: [phone]   Fax:  [phone]  Speech Language Pathology Treatment  Patient Details  Name: Martha Clark MRN: [id number] Date of Birth: 06/01/1955 Referring Provider:  Merri Brunette, MD  Encounter Date: 02/08/2015      End of Session - 02/08/15 1018    Visit Number 13   Number of Visits 17   Date for SLP Re-Evaluation 02/08/15   Authorization - Visit Number 12   Authorization - Number of Visits 60   SLP Start Time [phone]   SLP Stop Time  1018   SLP Time Calculation (min) 40 min   Activity Tolerance Patient tolerated treatment well      Past Medical History  Diagnosis Date  . Atrial fibrillation   . Asthma   . Osteoporosis   . Mitral valve prolapse   . History of CVA (cerebrovascular accident) 11/20/14  . 1St degree AV block   . Typical atrial flutter     Past Surgical History  Procedure Laterality Date  . Tubal ligation    . Mitral valve replacement  2008  . Radiology with anesthesia N/A 11/20/2014    Procedure: RADIOLOGY WITH ANESTHESIA;  Surgeon: Julieanne Cotton, MD;  Location: [facility];  Service: Radiology;  Laterality: N/A;  . Electrophysiologic study N/A 01/17/2015    Procedure: A-Flutter Ablation;  Surgeon: Marinus Maw, MD;  Location: [facility];  Service: Cardiovascular;  Laterality: N/A;    There were no vitals filed for this visit.  Visit Diagnosis: Cognitive deficits following cerebral infarction      Subjective Assessment - 02/08/15 0947    Subjective Pt arrived today with notes from discussion with neuropsychologist re: testing.               ADULT SLP TREATMENT - 02/08/15 0950    General Information   Behavior/Cognition Alert;Cooperative;Pleasant mood   Treatment Provided   Treatment provided Cognitive-Linquistic   Pain Assessment   Pain Assessment No/denies pain   Cognitive-Linquistic Treatment   Treatment focused on Cognition   Skilled Treatment Divided attention task today, with auditory and written tasks. 88% success with auditory tasks, and 90% with written tasks.    Assessment / Recommendations / Plan   Plan Continue with current plan of care   Progression Toward Goals   Progression toward goals Progressing toward goals            SLP Short Term Goals - 01/06/15 1054    SLP SHORT TERM GOAL #1   Title pt to ID errors in high level cognitive linguistic tasks 100%   Time 1   Status Achieved   SLP SHORT TERM GOAL #2   Title pt will divide attention with 90% success on both tasks over 25 mintues   Time 1   Period Weeks   Status On-going          SLP Long Term Goals - 02/08/15 1358    SLP LONG TERM GOAL #1   Title pt will ID errors in complex cognitive linguistic tasks 100% with self-correction   Time 2   Period Weeks   Status On-going   SLP LONG TERM GOAL #2   Title pt will divide attention in 35 minutes complex cognitive linguistic tasks with 100% success with self-correction   Time 2   Period Weeks   Status On-going          Plan - 02/08/15 1357  Clinical Impression Statement Pt cont to work with divided attention, noted today she over-corrected, often writing down too much information for auditory stimuli than was necessary. Pt's plan is to return to work next week and problem solve with OT and SLP one-two sessions after her return to work, for problem solving.    Speech Therapy Frequency 2x / week   Duration 2 weeks  3 weeks   Treatment/Interventions SLP instruction and feedback;Compensatory strategies;Patient/family education;Functional tasks;Cueing hierarchy;Cognitive reorganization   Potential to Achieve Goals Good        Problem List Patient Active Problem List   Diagnosis Date Noted  . Coarctation of aorta, recurrent, post-intervention   . Hypokalemia   . 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 Atrial flutter 11/16/2014  . Chest pain on respiration 10/07/2011  . ASTHMA, UNSPECIFIED, UNSPECIFIED STATUS 05/30/2009  . OTHER OSTEOPOROSIS 05/30/2009  . MITRAL VALVE REPLACEMENT, HX OF 05/30/2009    [facility] , MS, CCC-SLP  02/08/2015, 1:59 PM  [facility] [address] [place], [place], [address] Phone: [phone]   Fax:  [phone]

## 2015-02-08 NOTE — Therapy (Signed)
[facility] [address] [place], [place], [address] Phone: [phone]   Fax:  [phone]  Occupational Therapy Treatment  Patient Details  Name: Martha Clark MRN: [id number] Date of Birth: 1955/11/06 Referring Provider:  Deland Pretty, MD  Encounter Date: 02/08/2015      OT End of Session - 02/08/15 1733    Visit Number 14   Number of Visits 17   Authorization Type [employer], 60 visit combined, 55 remaining ?at time of eval   OT Start Time 1018   OT Stop Time 1100   OT Time Calculation (min) 42 min   Activity Tolerance Patient tolerated treatment well   Behavior During Therapy WFL for tasks assessed/performed      Past Medical History  Diagnosis Date  . Atrial fibrillation   . Asthma   . Osteoporosis   . Mitral valve prolapse   . History of CVA (cerebrovascular accident) 11/20/14  . 1St degree AV block   . Typical atrial flutter     Past Surgical History  Procedure Laterality Date  . Tubal ligation    . Mitral valve replacement  2008  . Radiology with anesthesia N/A 11/20/2014    Procedure: RADIOLOGY WITH ANESTHESIA;  Surgeon: Luanne Bras, MD;  Location: [facility];  Service: Radiology;  Laterality: N/A;  . Electrophysiologic study N/A 01/17/2015    Procedure: A-Flutter Ablation;  Surgeon: Evans Lance, MD;  Location: [place] [facility];  Service: Cardiovascular;  Laterality: N/A;    There were no vitals filed for this visit.  Visit Diagnosis:  Cognitive deficits following cerebral infarction    Treatment: Self care; Therapist worked with pt on strategies for organization and compensation for cognitive changes when she goes back to work next week. Pt viewed her work schedule and with min v.c. form therapist, pt made a list of activities she needed to perform each day and schedule changes that needed to occur. Theraputic activities: Mulit tasking for physical and cognitive task, occasional min v.c.,  improved performance today.                          OT Short Term Goals - 01/26/15 1010    OT SHORT TERM GOAL #1   Title I with HEP.   Baseline theraband with occasional min v.c.   Status Partially Met   OT SHORT TERM GOAL #2   Title Pt will increase LUE grip strength  to at least 25 lbs for increased functional use.   Baseline LUE 32 lbs   Status Achieved   OT SHORT TERM GOAL #3   Title Pt will demonstrate ability to perform a physical and cognitive task simultaneously with 80% or greater acccuracy and no greater than 1 LOB.   Status On-going   OT SHORT TERM GOAL #4   Title Pt will demonstrate adequate LUE strength and endurance to retrieve 5 lbs weight from overhead shelf x 5 reps without drops.   Status On-going   OT SHORT TERM GOAL #5   Title Pt will demonstrate ability to organize and complete a mod complex cooking task, demonstrating good safety awareness, modified independently.   Baseline Performss however pt turned on wrong stove burner yet self corrected   Status On-going           OT Long Term Goals - 02/03/15 1548    OT LONG TERM GOAL #1   Title Pt will demonstrate LUE grip strength of 30  lbs or greater for increased functional use,   Baseline due 02/11/15   Time 8   Status On-going   OT LONG TERM GOAL #2   Title Pt will demonstrate ability to perform a physical and cognitive task simultaneously, with 95%  or better accuracy and no LOB.   Time 8   Period Weeks   Status On-going   OT LONG TERM GOAL #3   Title Pt will report adequate strength and endurance to perform  home mnagement/ cooking activities in standing for 1 hour prior to rest break.   Time 8   Period Weeks   Status On-going   OT LONG TERM GOAL #4   Title Pt will demonstrate ability to perfom a moderate complex organization task with 95% or greater accuracy.   Time 8   Period Weeks   Status On-going               Plan - 02/08/15 1735    Clinical Impression  Statement Pt is progressing towards goals. Pt starts back to work part time next week. Plan to start checking goals next visit.   Pt will benefit from skilled therapeutic intervention in order to improve on the following deficits (Retired) Decreased endurance;Decreased activity tolerance;Impaired UE functional use;Decreased cognition;Decreased mobility;Decreased strength   Rehab Potential Good   OT Frequency 2x / week   OT Duration 8 weeks   OT Treatment/Interventions Self-care/ADL training;Fluidtherapy;Moist Heat;DME and/or AE instruction;Patient/family education;Therapeutic exercises;Ultrasound;Therapeutic exercise;Therapeutic activities;Cognitive remediation/compensation;Passive range of motion;Neuromuscular education;Electrical Stimulation;Visual/perceptual remediation/compensation;Cryotherapy;Parrafin   Plan start checking goals, reinforce HEP, cognition   Consulted and Agree with Plan of Care Patient        Problem List Patient Active Problem List   Diagnosis Date Noted  . Coarctation of aorta, recurrent, post-intervention   . Hypokalemia   . 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 Atrial flutter 11/16/2014  . Chest pain on respiration 10/07/2011  . ASTHMA, UNSPECIFIED, UNSPECIFIED STATUS 05/30/2009  . OTHER OSTEOPOROSIS 05/30/2009  . MITRAL VALVE REPLACEMENT, HX OF 05/30/2009    [place] 02/08/2015, 5:38 PM Theone [place]doch, OTR/L [phone] Phone: [phone] 5:38 PM 02/08/2015  [facility] [address] [place], [place], [address] Phone: [phone]   Fax:  [phone]

## 2015-02-10 ENCOUNTER — Ambulatory Visit: Payer: BLUE CROSS/BLUE SHIELD

## 2015-02-10 ENCOUNTER — Ambulatory Visit: Payer: BLUE CROSS/BLUE SHIELD | Admitting: Occupational Therapy

## 2015-02-10 DIAGNOSIS — I6931 Cognitive deficits following cerebral infarction: Secondary | ICD-10-CM | POA: Diagnosis not present

## 2015-02-10 DIAGNOSIS — R6889 Other general symptoms and signs: Secondary | ICD-10-CM

## 2015-02-10 DIAGNOSIS — IMO0002 Reserved for concepts with insufficient information to code with codable children: Secondary | ICD-10-CM

## 2015-02-10 DIAGNOSIS — R41841 Cognitive communication deficit: Secondary | ICD-10-CM

## 2015-02-10 NOTE — Therapy (Signed)
[facility] [address] [place], [place], [address] Phone: [phone]   Fax:  [phone]  Speech Language Pathology Treatment  Patient Details  Name: Martha Clark MRN: [id number] Date of Birth: 1955-12-26 Referring Provider:  Merri Brunette, MD  Encounter Date: 02/10/2015      End of Session - 02/10/15 1319    Visit Number 14   Number of Visits 17   Date for SLP Re-Evaluation 02/25/15   Authorization - Visit Number 13   Authorization - Number of Visits 60   SLP Start Time 0935   SLP Stop Time  1016   SLP Time Calculation (min) 41 min   Activity Tolerance Patient tolerated treatment well      Past Medical History  Diagnosis Date  . Atrial fibrillation   . Asthma   . Osteoporosis   . Mitral valve prolapse   . History of CVA (cerebrovascular accident) 11/20/14  . 1St degree AV block   . Typical atrial flutter     Past Surgical History  Procedure Laterality Date  . Tubal ligation    . Mitral valve replacement  2008  . Radiology with anesthesia N/A 11/20/2014    Procedure: RADIOLOGY WITH ANESTHESIA;  Surgeon: Julieanne Cotton, MD;  Location: [facility] OR;  Service: Radiology;  Laterality: N/A;  . Electrophysiologic study N/A 01/17/2015    Procedure: A-Flutter Ablation;  Surgeon: Marinus Maw, MD;  Location: [facility] INVASIVE CV LAB;  Service: Cardiovascular;  Laterality: N/A;    There were no vitals filed for this visit.  Visit Diagnosis: Cognitive communication deficit             ADULT SLP TREATMENT - 02/10/15 0946    General Information   Behavior/Cognition Alert;Cooperative;Pleasant mood   Treatment Provided   Treatment provided Cognitive-Linquistic   Pain Assessment   Pain Assessment No/denies pain   Cognitive-Linquistic Treatment   Treatment focused on Cognition   Skilled Treatment Pt entered room and stated she found the errors in her work from last visit, She missed 4 items on detailed  written instructions. Pt commented she would need to re-check detailed items multiple times. SLP suggested having her assistant as "another set of eyes."  SLP facilitated pt's skills with divided attention by having pt plan a trip online and answer random questions and converse in simple Q and A with SLP.  In distracting stimuli with numbers pt demo'd alternating attention, but wiht everything else as distractor she was able to mulstitask.   Assessment / Recommendations / Plan   Plan --  Continue one visit after pt returns to work to problem solve   Progression Toward Goals   Progression toward goals Progressing toward goals            SLP Short Term Goals - 01/06/15 1054    SLP SHORT TERM GOAL #1   Title pt to ID errors in high level cognitive linguistic tasks 100%   Time 1   Status Achieved   SLP SHORT TERM GOAL #2   Title pt will divide attention with 90% success on both tasks over 25 mintues   Time 1   Period Weeks   Status On-going          SLP Long Term Goals - 02/10/15 1322    SLP LONG TERM GOAL #1   Title pt will ID errors in complex cognitive linguistic tasks 100% with self-correction   Time 2   Period Weeks   Status On-going  SLP LONG TERM GOAL #2   Title pt will divide attention in 35 minutes complex cognitive linguistic tasks with 100% success with self-correction   Time 2   Period Weeks   Status On-going          Plan - 02/10/15 1321    Clinical Impression Statement --   Speech Therapy Frequency 2x / week   Duration 2 weeks  pt's plan is to return for one more visit to problem solve with any difficulties during her first full week back to work   Treatment/Interventions SLP instruction and feedback;Compensatory strategies;Patient/family education;Functional tasks;Cueing hierarchy;Cognitive reorganization   Potential to Achieve Goals Good        Problem List Patient Active Problem List   Diagnosis Date Noted  . Coarctation of aorta, recurrent,  post-intervention   . Hypokalemia   . 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 Atrial flutter 11/16/2014  . Chest pain on respiration 10/07/2011  . ASTHMA, UNSPECIFIED, UNSPECIFIED STATUS 05/30/2009  . OTHER OSTEOPOROSIS 05/30/2009  . MITRAL VALVE REPLACEMENT, HX OF 05/30/2009    [place] , MS, CCC-SLP  02/10/2015, 1:22 PM  [facility] [address] [place], [place], [address] Phone: [phone]   Fax:  [phone]

## 2015-02-10 NOTE — Therapy (Signed)
[facility] [address] [place], [place], [address] Phone: [phone]   Fax:  [phone]  Occupational Therapy Treatment  Patient Details  Name: Martha Clark MRN: [id number] Date of Birth: June 04, 1955 Referring Provider:  Deland Pretty, MD  Encounter Date: 02/10/2015      OT End of Session - 02/10/15 1648    Visit Number 15   Number of Visits 17   Authorization Type [employer], 60 visit combined, 75 remaining ?at time of eval   OT Start Time 1021   OT Stop Time 1100   OT Time Calculation (min) 39 min   Activity Tolerance Patient tolerated treatment well   Behavior During Therapy WFL for tasks assessed/performed      Past Medical History  Diagnosis Date  . Atrial fibrillation   . Asthma   . Osteoporosis   . Mitral valve prolapse   . History of CVA (cerebrovascular accident) 11/20/14  . 1St degree AV block   . Typical atrial flutter     Past Surgical History  Procedure Laterality Date  . Tubal ligation    . Mitral valve replacement  2008  . Radiology with anesthesia N/A 11/20/2014    Procedure: RADIOLOGY WITH ANESTHESIA;  Surgeon: Luanne Bras, MD;  Location: [place];  Service: Radiology;  Laterality: N/A;  . Electrophysiologic study N/A 01/17/2015    Procedure: A-Flutter Ablation;  Surgeon: Evans Lance, MD;  Location: [place] CV LAB;  Service: Cardiovascular;  Laterality: N/A;    There were no vitals filed for this visit.  Visit Diagnosis:  Weakness due to cerebrovascular accident  Decreased functional activity tolerance      Subjective Assessment - 02/10/15 1313    Subjective  Pt is returning to work on Monday.   Pertinent History MVP s/p bovine mitral valve replacement, A-fib, (see EPIC snap shot for additional info)   Patient Stated Goals return to work   Currently in Pain? No/denies                      OT Treatments/Exercises (OP) - 02/10/15 0001    Exercises    Exercises Shoulder;Elbow   Shoulder Exercises: Standing   Flexion AROM;Both;10 reps   Shoulder Flexion Weight (lbs)  3 lbs for LUe, 5 lbs RUE   ABduction AROM;Both;10 reps;Weights   Shoulder ABduction Weight (lbs) 3 lbs LUE, % lbs RUE   Elbow Exercises   Bar Weights/Barbell (Elbow Flexion) 5 lbs   Elbow Extension AROM;Both;Strengthening;10 reps;Standing   Bar Weights/Barbell (Elbow Extension) 3 lbs  LUE 3lbs, 5 lbs RUE overhead     Treatment: updates to pt HEP for abdominal / core strength, see pt instructions. Min v.c. then pt returned demonstration. Arm bike x 6 mins level 3 for conditioning.            OT Education - 02/10/15 1635    Education provided Yes   Education Details HEP updates   Person(s) Educated Patient   Methods Explanation;Demonstration;Verbal cues;Handout   Comprehension Verbalized understanding;Returned demonstration          OT Short Term Goals - 02/10/15 1024    OT SHORT TERM GOAL #1   Title I with HEP.   Baseline theraband with occasional min v.c.   Status Partially Met   OT SHORT TERM GOAL #2   Title Pt will increase LUE grip strength  to at least 25 lbs for increased functional use.   Baseline LUE 35 on 02/10/15  Status Achieved   OT SHORT TERM GOAL #3   Title Pt will demonstrate ability to perform a physical and cognitive task simultaneously with 80% or greater acccuracy and no greater than 1 LOB.   Status (p) Achieved   OT SHORT TERM GOAL #4   Title Pt will demonstrate adequate LUE strength and endurance to retrieve 5 lbs weight from overhead shelf x 5 reps without drops.   Status (p) Achieved   OT SHORT TERM GOAL #5   Title Pt will demonstrate ability to organize and complete a mod complex cooking task, demonstrating good safety awareness, modified independently.   Baseline met, Pt reports performing yesterday   Status Achieved   OT SHORT TERM GOAL #6   Title further assess cognition and add additionl goal PRN.   Baseline deferred to  ST, OT to address in functional context, Pt may benefit from neuro psych testing prior to return to work   Status Deferred           OT Long Term Goals - 02/10/15 1028    [place] #1   Title Pt will demonstrate LUE grip strength of 30 lbs or greater for increased functional use,   Baseline 35 lbs   Status Achieved   OT LONG TERM GOAL #2   Title Pt will demonstrate ability to perform a physical and cognitive task simultaneously, with 95%  or better accuracy and no LOB.   Status On-going   OT LONG TERM GOAL #3   Title Pt will report adequate strength and endurance to perform  home management/ cooking activities in standing for 1 hour prior to rest break.   Status Achieved   OT LONG TERM GOAL #4   Title Pt will demonstrate ability to perfom a moderate complex organization task with 95% or greater accuracy.   Status On-going               Plan - 02/10/15 1641    Clinical Impression Statement Pt demonstrates progress towards goals. She starts back to work next week part time. Pt is scheduled for 1 additional visit week after next  in case she needs additional compensatory strategies for work activities.   Pt will benefit from skilled therapeutic intervention in order to improve on the following deficits (Retired) Decreased endurance;Decreased activity tolerance;Impaired UE functional use;Decreased cognition;Decreased mobility;Decreased strength   Rehab Potential Good   OT Frequency 2x / week   OT Duration 8 weeks   OT Treatment/Interventions Self-care/ADL training;Fluidtherapy;Moist Heat;DME and/or AE instruction;Patient/family education;Therapeutic exercises;Ultrasound;Therapeutic exercise;Therapeutic activities;Cognitive remediation/compensation;Passive range of motion;Neuromuscular education;Electrical Stimulation;Visual/perceptual remediation/compensation;Cryotherapy;Parrafin   Plan Pt to be ssen for 1 additional visit after she has returned to work 1/2 time for 1 week         Problem List Patient Active Problem List   Diagnosis Date Noted  . Coarctation of aorta, recurrent, post-intervention   . Hypokalemia   . 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 Atrial flutter 11/16/2014  . Chest pain on respiration 10/07/2011  . ASTHMA, UNSPECIFIED, UNSPECIFIED STATUS 05/30/2009  . OTHER OSTEOPOROSIS 05/30/2009  . MITRAL VALVE REPLACEMENT, HX OF 05/30/2009    Badr Piedra 02/10/2015, 4:50 PM Theone Murdoch, OTR/L [phone] Phone: [phone] 4:50 PM 02/10/2015 [facility] [address] [place], [place], [address] Phone: [phone]   Fax:  [phone]

## 2015-02-10 NOTE — Patient Instructions (Signed)
Continue previous exercises for: shoulder flexion, abduction and overhead press with 3 lbs for left arm and 5 lbs for right arm. Biceps curls with 5 lbs bilaterally. 10-20 reps each, every other day.  Laying on back raise arm and leg on the same side, you can hold a 2 lbs weight if needed for incr. Resistance. 10-20 reps  On all 4's raise left and right legs alternately then left and right arms alternately 10-20 reps each. If this becomes to easy, raise right arm with left leg then left arm with right leg 10x. Perform planks on elbows 5-10 reps for 5 secs each.

## 2015-02-16 ENCOUNTER — Ambulatory Visit (INDEPENDENT_AMBULATORY_CARE_PROVIDER_SITE_OTHER): Payer: BLUE CROSS/BLUE SHIELD | Admitting: Internal Medicine

## 2015-02-16 ENCOUNTER — Encounter: Payer: Self-pay | Admitting: Internal Medicine

## 2015-02-16 VITALS — BP 90/66 | HR 64 | Ht 67.5 in | Wt 122.4 lb

## 2015-02-16 DIAGNOSIS — I6340 Cerebral infarction due to embolism of unspecified cerebral artery: Secondary | ICD-10-CM

## 2015-02-16 DIAGNOSIS — I483 Typical atrial flutter: Secondary | ICD-10-CM | POA: Diagnosis not present

## 2015-02-16 NOTE — Assessment & Plan Note (Signed)
She is doing well, s/p catheter ablation. She will continue her current meds except she will wean her beta blocker off as she has had hypotension.

## 2015-02-16 NOTE — Assessment & Plan Note (Signed)
Unclear if she has both atrial fib as well as flutter. She is maintaining NSR now. At this point I would not stop her systemic anti-coagulation.

## 2015-02-16 NOTE — Patient Instructions (Signed)
Medication Instructions: - Decrease metoprolol to 1/2 tablet by mouth twice daily x 1 week, then] - Decrease metoprolol to 1/2 tablet by mouth once daily x 1 week, then stop  Labwork: - none  Procedures/Testing: - none  Follow-Up: - Dr. Ladona Ridgel will see you back on an as needed basis.  Any Additional Special Instructions Will Be Listed Below (If Applicable). - none

## 2015-02-16 NOTE — Progress Notes (Signed)
HPI  Martha Clark returns today for followup of atrial flutter, s/p catheter ablation. 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 She had sob and palpitations which awakened her from sleep over 2 months ago, she presented to the ED where she was in either atrial fib or flutter, difficult to discern, and was cardioverted. 3 days later she had a stroke. She has undergone rehab and has minimal deficit. She subsequently developed recurrent atrial flutter with a RVR, and underwent successful catheter ablation of typical, isthmus dependent atrial flutter several weeks ago. She is improved. She has started back to work. She has had a couple of lightheaded spells she attributes to not eating as she should.   No Known Allergies   Current Outpatient Prescriptions  Medication Sig Dispense Refill  . acetaminophen (TYLENOL) 325 MG tablet Take 1,300 mg by mouth every 6 (six) hours as needed for mild pain.    Marland Kitchen apixaban (ELIQUIS) 5 MG TABS tablet Take 1 tablet (5 mg total) by mouth 2 (two) times daily. 60 tablet 12  . buPROPion (WELLBUTRIN XL) 150 MG 24 hr tablet Take 150 mg by mouth Daily.     . calcium citrate-vitamin D (CITRACAL+D) 315-200 MG-UNIT per tablet Take 1 tablet by mouth daily.      . Cholecalciferol (VITAMIN D) 1000 UNITS capsule Take 1,000 Units by mouth daily.      . Coenzyme Q10 (COQ-10) 100 MG CAPS Take 1 tablet by mouth daily.      . Fish Oil OIL Take 15 mLs by mouth daily.     Marland Kitchen MAGNESIUM CHLORIDE PO Take 1 tablet by mouth daily.      . metoprolol tartrate (LOPRESSOR) 25 MG tablet Take 1 tablet (25 mg total) by mouth 2 (two) times daily. 60 tablet 2  . Multiple Vitamin (MULTIVITAMIN WITH MINERALS) TABS tablet Take 1 tablet by mouth daily.    . Multiple Vitamins-Minerals (EYE VITAMINS PO) Take 1 tablet by mouth daily.    . vitamin C (ASCORBIC ACID) 500 MG tablet  Take 500 mg by mouth daily.     No current facility-administered medications for this visit.     Past Medical History  Diagnosis Date  . Atrial fibrillation   . Asthma   . Osteoporosis   . Mitral valve prolapse   . History of CVA (cerebrovascular accident) 11/20/14  . 1St degree AV block   . Typical atrial flutter     ROS:   All systems reviewed and negative except as noted in the HPI.   Past Surgical History  Procedure Laterality Date  . Tubal ligation    . Mitral valve replacement  2008  . Radiology with anesthesia N/A 11/20/2014    Procedure: RADIOLOGY WITH ANESTHESIA;  Surgeon: Julieanne Cotton, MD;  Location: [facility] OR;  Service: Radiology;  Laterality: N/A;  . Electrophysiologic study N/A 01/17/2015    Procedure: A-Flutter Ablation;  Surgeon: Marinus Maw, MD;  Location: [facility] INVASIVE CV LAB;  Service: Cardiovascular;  Laterality: N/A;     Family History  Problem Relation Age of Onset  . Diabetes Father   . Stroke Father   . Hypertension Sister   . Cancer Paternal Uncle     lung cancer  . Breast cancer Maternal Grandmother 57     Social History   Social History  . Marital Status: Married  Spouse Name: N/A  . Number of Children: N/A  . Years of Education: N/A   Occupational History  . Not on file.   Social History Main Topics  . Smoking status: Never Smoker   . Smokeless tobacco: Not on file  . Alcohol Use: No  . Drug Use: No  . Sexual Activity: Not Currently    Birth Control/ Protection: None   Other Topics Concern  . Not on file   Social History Narrative     BP 90/66 mmHg  Pulse 64  Ht 5' 7.5" (1.715 m)  Wt 122 lb 6.4 oz (55.52 kg)  BMI 18.88 kg/m2  Physical Exam:  Well appearing 59 yo woman, NAD HEENT: Unremarkable Neck:  No JVD, no thyromegally Lymphatics:  No adenopathy Back:  No CVA tenderness Lungs:  Clear with no wheezes HEART:  IRegular rate rhythm, no murmurs, no rubs, no clicks Abd:  soft, positive bowel sounds, no  organomegally, no rebound, no guarding Ext:  2 plus pulses, no edema, no cyanosis, no clubbing Skin:  No rashes no nodules Neuro:  CN II through XII intact, motor grossly intact  EKG - NSR with first degree AV block, RBBB and LAE.  Assess/Plan:

## 2015-02-22 ENCOUNTER — Ambulatory Visit: Payer: BLUE CROSS/BLUE SHIELD

## 2015-02-22 ENCOUNTER — Ambulatory Visit: Payer: BLUE CROSS/BLUE SHIELD | Admitting: Occupational Therapy

## 2015-02-22 DIAGNOSIS — I6931 Cognitive deficits following cerebral infarction: Secondary | ICD-10-CM | POA: Diagnosis not present

## 2015-02-22 DIAGNOSIS — R6889 Other general symptoms and signs: Secondary | ICD-10-CM

## 2015-02-22 DIAGNOSIS — IMO0002 Reserved for concepts with insufficient information to code with codable children: Secondary | ICD-10-CM

## 2015-02-22 DIAGNOSIS — R41841 Cognitive communication deficit: Secondary | ICD-10-CM

## 2015-02-22 DIAGNOSIS — I69319 Unspecified symptoms and signs involving cognitive functions following cerebral infarction: Secondary | ICD-10-CM

## 2015-02-22 NOTE — Patient Instructions (Signed)
Continue to do divided attention tasks as you are able at home

## 2015-02-23 NOTE — Therapy (Signed)
[facility] [address] [place], [place], [address] Phone: [phone]   Fax:  [phone]  Occupational Therapy Treatment  Patient Details  Name: Martha Clark MRN: [id number] Date of Birth: July 02, 1955 Referring Provider:  Deland Pretty, MD  Encounter Date: 02/22/2015    Past Medical History  Diagnosis Date  . Atrial fibrillation   . Asthma   . Osteoporosis   . Mitral valve prolapse   . History of CVA (cerebrovascular accident) 11/20/14  . 1St degree AV block   . Typical atrial flutter     Past Surgical History  Procedure Laterality Date  . Tubal ligation    . Mitral valve replacement  2008  . Radiology with anesthesia N/A 11/20/2014    Procedure: RADIOLOGY WITH ANESTHESIA;  Surgeon: Luanne Bras, MD;  Location: [place];  Service: Radiology;  Laterality: N/A;  . Electrophysiologic study N/A 01/17/2015    Procedure: A-Flutter Ablation;  Surgeon: Evans Lance, MD;  Location: [place] CV LAB;  Service: Cardiovascular;  Laterality: N/A;    There were no vitals filed for this visit.  Visit Diagnosis:  Weakness due to cerebrovascular accident  Decreased functional activity tolerance  Cognitive deficits following cerebral infarction      Subjective Assessment - 02/22/15 1341    Subjective  Pt reports work is going okay   Pertinent History MVP s/p bovine mitral valve replacement, A-fib, (see EPIC snap shot for additional info)   Patient Stated Goals return to work   Currently in Pain? No/denies         Treatment: Therapist checked progress towards remaining goals and discuss organization strategies for work. Pt reports HEP is going well. Multi-tasking for physical and cognitive task, pt performs with 955 or better accuracy, yet when it requires recall, like category generation pt demonstrates grossly 90% accuracy.                       OT Short Term Goals - 02/23/15 1720    OT  SHORT TERM GOAL #1   Title I with HEP.   Baseline theraband with occasional min v.c.   Status Achieved   OT SHORT TERM GOAL #2   Title Pt will increase LUE grip strength  to at least 25 lbs for increased functional use.   Baseline LUE 35 on 02/10/15   Status Achieved   OT SHORT TERM GOAL #3   Title Pt will demonstrate ability to perform a physical and cognitive task simultaneously with 80% or greater acccuracy and no greater than 1 LOB.   Status Achieved   OT SHORT TERM GOAL #4   Title Pt will demonstrate adequate LUE strength and endurance to retrieve 5 lbs weight from overhead shelf x 5 reps without drops.   Status Achieved   OT SHORT TERM GOAL #5   Title Pt will demonstrate ability to organize and complete a mod complex cooking task, demonstrating good safety awareness, modified independently.   Baseline met, Pt reports performing yesterday   Status Achieved   OT SHORT TERM GOAL #6   Title further assess cognition and add additionl goal PRN.   Baseline deferred to ST, OT to address in functional context, Pt may benefit from neuro psych testing prior to return to work   Status Deferred           OT Long Term Goals - 02/22/15 1326    [place] #1   Title Pt will demonstrate  LUE grip strength of 30 lbs or greater for increased functional use,   Baseline 35 lbs   Status Achieved   OT LONG TERM GOAL #2   Title Pt will demonstrate ability to perform a physical and cognitive task simultaneously, with 95%  or better accuracy and no LOB.   Baseline met for basic, tasks requiring recall she did not perform as well.   Status Partially Met   OT LONG TERM GOAL #3   Title Pt will report adequate strength and endurance to perform  home management/ cooking activities in standing for 1 hour prior to rest break.   Status Achieved   OT LONG TERM GOAL #4   Title Pt will demonstrate ability to perfom a moderate complex organization task with 95% or greater accuracy.   Status Achieved                Plan - 02/22/15 1354    Clinical Impression Statement Pt has returned to work and she reports it is going well. Pt agrees with plans to discharge today.   Pt will benefit from skilled therapeutic intervention in order to improve on the following deficits (Retired) Decreased endurance;Decreased activity tolerance;Impaired UE functional use;Decreased cognition;Decreased mobility;Decreased strength   Rehab Potential Good   OT Frequency 2x / week   OT Duration 8 weeks   OT Treatment/Interventions Self-care/ADL training;Fluidtherapy;Moist Heat;DME and/or AE instruction;Patient/family education;Therapeutic exercises;Ultrasound;Therapeutic exercise;Therapeutic activities;Cognitive remediation/compensation;Passive range of motion;Neuromuscular education;Electrical Stimulation;Visual/perceptual remediation/compensation;Cryotherapy;Parrafin   Plan Pt agrees withplans to d/c   Consulted and Agree with Plan of Care Patient        Problem List Patient Active Problem List   Diagnosis Date Noted  . Coarctation of aorta, recurrent, post-intervention   . Hypokalemia   . 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 Atrial flutter 11/16/2014  . Chest pain on respiration 10/07/2011  . ASTHMA, UNSPECIFIED, UNSPECIFIED STATUS 05/30/2009  . OTHER OSTEOPOROSIS 05/30/2009  . MITRAL VALVE REPLACEMENT, HX OF 05/30/2009   OCCUPATIONAL THERAPY DISCHARGE SUMMARY   Current functional level related to goals / functional outcomes: Pt made excellent overall gains in cognition. See above for progress towards goals.   Remaining deficits: Cognitive deficits, decreased endurance   Education / Equipment: Pt was educated regarding : compensatory strategies for cognitive deficits and HEP. Pt verbalized understanding of all education.  Plan: Patient agrees to discharge.  Patient goals were not met. Patient is being discharged due to meeting  the stated rehab goals.  ?????     RINE,KATHRYN 02/23/2015, 5:21 PM Kathryn Rine, OTR/L [phone] Phone: [phone] 5:21 PM 02/23/2015 [facility] [address] [place], [place], [address] Phone: [phone]   Fax:  [phone]    

## 2015-02-23 NOTE — Therapy (Signed)
[facility] [address] [place], [place], [address] Phone: [phone]   Fax:  [phone]  Speech Language Pathology Treatment  Patient Details  Name: Martha Clark MRN: [id number] Date of Birth: January 27, 1956 Referring Provider:  Deland Pretty, MD  Encounter Date: 02/22/2015      End of Session - 02/22/15 1932    Visit Number 15   Number of Visits 17   Date for SLP Re-Evaluation 02/25/15   Authorization - Visit Number 63   Authorization - Number of Visits 43   SLP Start Time 6962   SLP Stop Time  1448   SLP Time Calculation (min) 43 min   Activity Tolerance Patient tolerated treatment well      Past Medical History  Diagnosis Date  . Atrial fibrillation   . Asthma   . Osteoporosis   . Mitral valve prolapse   . History of CVA (cerebrovascular accident) 11/20/14  . 1St degree AV block   . Typical atrial flutter     Past Surgical History  Procedure Laterality Date  . Tubal ligation    . Mitral valve replacement  2008  . Radiology with anesthesia N/A 11/20/2014    Procedure: RADIOLOGY WITH ANESTHESIA;  Surgeon: Luanne Bras, MD;  Location: [facility];  Service: Radiology;  Laterality: N/A;  . Electrophysiologic study N/A 01/17/2015    Procedure: A-Flutter Ablation;  Surgeon: Evans Lance, MD;  Location: [facility];  Service: Cardiovascular;  Laterality: N/A;    There were no vitals filed for this visit.  Visit Diagnosis: Cognitive communication deficit      Subjective Assessment - 02/22/15 1848    Subjective Pt has been back to work for over one week; half days through this week.               ADULT SLP TREATMENT - 02/22/15 1852    General Information   Behavior/Cognition Alert;Cooperative;Pleasant mood   Treatment Provided   Treatment provided Cognitive-Linquistic   Pain Assessment   Pain Assessment No/denies pain   Cognitive-Linquistic Treatment   Treatment focused on  Cognition   Skilled Treatment Pt reported using compensations for 60 minute meeting last week requiring divided attention. Pt without difficulty when using compensations. In conversation with SLP pt reported her fatigue level has been less and less upon returning home since she began back to work. Pt without notable difficulty other than with fatigue and dividied attention, which pt is compensating for. Pt reports she is more animated at home, husband states.   Assessment / Recommendations / Plan   Plan Discharge SLP treatment due to (comment)   Progression Toward Goals   Progression toward goals --  pt's remaining LTGs will be deferred;             SLP Short Term Goals - 01/06/15 1054    SLP SHORT TERM GOAL #1   Title pt to ID errors in high level cognitive linguistic tasks 100%   Time 1   Status Achieved   SLP SHORT TERM GOAL #2   Title pt will divide attention with 90% success on both tasks over 25 mintues   Time 1   Period Weeks   Status On-going          SLP Long Term Goals - 02/22/15 1935    SLP LONG TERM GOAL #1   Title pt will ID errors in complex cognitive linguistic tasks 100% with self-correction   Time 2   Period Weeks  Status Deferred   SLP LONG TERM GOAL #2   Title pt will divide attention in 35 minutes complex cognitive linguistic tasks with 100% success with self-correction   Time 2   Period Weeks   Status Deferred          Plan - 02/22/15 1933    Clinical Impression Statement Pt has returned to work and requests discharge from [facility]. SLP agrees that pt is at a level where this is appropriate.   Treatment/Interventions SLP instruction and feedback   Potential to Achieve Goals Good     SPEECH THERAPY DISCHARGE SUMMARY  Visits from Start of Care: 15  Current functional level related to goals / functional outcomes: Pt made gains with attention, to the point that she needs min A in divided attention tasks of mod-max complexity. At this time  pt has returned to work and has requested d/c from [facility]. She has explained to SLP that she is compensating in ways that are increasing her accuracy in divided attention tasks. Pt's other LTGs were deferred due to pt's current success in her return to work.   Remaining deficits: Mild high level cognitive linguistic deficits, which pt appears to be compensating for at work.   Education / Equipment: Compensations for attention, other high level cognitive linguistic deficits  Plan: Patient agrees to discharge.  Patient goals were partially met. Patient is being discharged due to the patient's request.  ?????She has returned to work and is compensating well for any remaining deficits.        Problem List Patient Active Problem List   Diagnosis Date Noted  . Coarctation of aorta, recurrent, post-intervention   . Hypokalemia   . 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 Atrial flutter 11/16/2014  . Chest pain on respiration 10/07/2011  . ASTHMA, UNSPECIFIED, UNSPECIFIED STATUS 05/30/2009  . OTHER OSTEOPOROSIS 05/30/2009  . MITRAL VALVE REPLACEMENT, HX OF 05/30/2009    [facility] , MS, CCC-SLP  02/23/2015, 10:42 AM  [facility] [address] [place], [place], [address] Phone: [phone]   Fax:  [phone]

## 2015-02-25 ENCOUNTER — Encounter: Payer: Self-pay | Admitting: Gynecology

## 2015-03-04 ENCOUNTER — Other Ambulatory Visit: Payer: Self-pay

## 2015-03-04 NOTE — Patient Outreach (Signed)
Left patient HIPAA compliant voicemail; following up to perform Modified Rankin Survey. Will try to continue to reach patient.

## 2015-05-06 ENCOUNTER — Encounter: Payer: Self-pay | Admitting: Women's Health

## 2015-05-06 ENCOUNTER — Ambulatory Visit (INDEPENDENT_AMBULATORY_CARE_PROVIDER_SITE_OTHER): Payer: BLUE CROSS/BLUE SHIELD | Admitting: Women's Health

## 2015-05-06 ENCOUNTER — Encounter: Payer: Self-pay | Admitting: Neurology

## 2015-05-06 ENCOUNTER — Ambulatory Visit (INDEPENDENT_AMBULATORY_CARE_PROVIDER_SITE_OTHER): Payer: BLUE CROSS/BLUE SHIELD | Admitting: Neurology

## 2015-05-06 VITALS — BP 124/75 | HR 81 | Ht 67.0 in | Wt 121.6 lb

## 2015-05-06 VITALS — BP 110/80 | Ht 67.0 in | Wt 122.0 lb

## 2015-05-06 DIAGNOSIS — H8113 Benign paroxysmal vertigo, bilateral: Secondary | ICD-10-CM

## 2015-05-06 DIAGNOSIS — Z01419 Encounter for gynecological examination (general) (routine) without abnormal findings: Secondary | ICD-10-CM

## 2015-05-06 NOTE — Progress Notes (Signed)
[facility] [address] [place]. Martha Clark [phone]       OFFICE FOLLOW UP VISIT  NOTE  Martha. Martha Clark Date of Birth:  Aug 16, 1955 Medical Record Number:  [id number]   Referring MD:  Birdie Sons Reason for Referral:  Stroke f/u  HPI:Martha Clark is a 59 year old Caucasian lady seen today for the first office follow-up visit following hospital admission for stroke in June 2016.Martha Clark is a 59 y.o. female with a past medical history significant for MVP s/p bovine mitral valve replacement, newly diagnosed atrial fibrillation s/p cardioversion in he ER on 6/21, osteoporosis, and asthma, brought in via EMS due to acute onset of left hemiparesis, dysarthria, and left facial weakness. Patient was home with family when she fell and called out for her family in the other room and noted patient to have left side paralysis with facial droop and slurred speech. EMS was immediately summoned and patient brought to the ED. Awake and alert at initial evaluation, with NIHSS 15 and CT scan showed no acute abnormality. She denied HA, vertigo, double vision, difficulty swallowing, or visual disturbances. No recent bleeding or surgeries. Patient not taking anticoagulants. NIHSS: 15. She was last known well 11/21/13 at 1655. She was administered IV tPA (50 mg on 11/20/2014 at 1800) followed by neurointervention with IA tPA and Solitaire device leading to TICI 3 revascularization. She was admitted to neuro ICU post procedure for further evaluation and treatement. She did well after the procedure and was extubated. She had mild left-sided weakness and dysmetria which in improve during her hospital stay. MRI scan of the brain showed patchy right basal ganglia infarct as well as smaller portions of frontal lobe. Transthoracic echo showed no cortex as well as embolism and tissue mitral valve was in good position. LDL cholesterol was 76 and hemoglobin A1c was 5.3. She was on aspirin  prior to admission given history of recent atrial fibrillation she was changed to eliquis. She did so well that she was discharged home with home physical and occupational therapy. She states she has continued to do well and physically has practically no deficits. Uneven she smiles she notices slight left facial asymmetry and some diminished fine motor skills in the left hand. She did feel a lot of fatigue and tiredness and felt like her mind was in a fog had trouble concentrating but after undergoing cardiac ablation for atrial flutter for the last week she feels a lot better like she is a different person. She's had a few transient episodes of blurred vision once while shopping in a grocery store when she felt lightheaded and woozy but only for a few seconds. On another occasion this happened when she was walking but felt better when she sat down. Her blood pressure has been running quite low in the 100 range and heart rate has been in the 50s. She has appointment to see Dr. Leonides Cave neuropsychologist later today for detailed testing. On Mini-Mental status testing in the office today she scored 29/30 with only one deficit in recall. She was able to name only 8 animals. She wants to go back to work but is willing to wait for neuropsych testing results to come back. Update 05/06/2015 : She returns for follow-up after last visit 3 months ago. She continues to do well and has not had any recurrent stroke or TIA symptoms. She is able to function well at her job but has noticed that her processing time needs increased. Her short-term memory  is also not as sharp. However she is compensated quite well and is productive at her job. She had a brief episode on 04/28/15 when she was bending down to reach something when she complained of vertigo. This lasted barely a few seconds present time she called her husband to help her she was okay. She denied a competing nausea, vomiting, blurred vision or headache. She has not been in  recurrent atrial flutter since successful catheter ablation. She remains on eliquis which is tolerating well without bleeding and only minor bruising. She is due for annual physical exam later this month and plans to have lipid profile checked at that time. Her blood pressure is doing well and today it is 124/75. She has seen her cardiologist Dr. Jacinto HalimGanji who wonders if she can come off the eliquis. ROS:   14 system review of systems is positive for vertigo, constipation, memory loss, dizziness and all other systems negative  PMH:  Past Medical History  Diagnosis Date  . Atrial fibrillation (HCC)   . Asthma   . Mitral valve prolapse   . History of CVA (cerebrovascular accident) 11/20/14  . 1St degree AV block     Social History:  Social History   Social History  . Marital Status: Married    Spouse Name: N/A  . Number of Children: N/A  . Years of Education: N/A   Occupational History  . Not on file.   Social History Main Topics  . Smoking status: Never Smoker   . Smokeless tobacco: Not on file  . Alcohol Use: No  . Drug Use: No  . Sexual Activity: Not Currently    Birth Control/ Protection: None   Other Topics Concern  . Not on file   Social History Narrative    Medications:   Current Outpatient Prescriptions on File Prior to Visit  Medication Sig Dispense Refill  . acetaminophen (TYLENOL) 325 MG tablet Take 1,300 mg by mouth every 6 (six) hours as needed for mild pain.    Marland Kitchen. apixaban (ELIQUIS) 5 MG TABS tablet Take 1 tablet (5 mg total) by mouth 2 (two) times daily. 60 tablet 12  . buPROPion (WELLBUTRIN XL) 150 MG 24 hr tablet Take 150 mg by mouth Daily.     . calcium citrate-vitamin D (CITRACAL+D) 315-200 MG-UNIT per tablet Take 1 tablet by mouth daily.      . Cholecalciferol (VITAMIN D) 1000 UNITS capsule Take 1,000 Units by mouth daily.      . Coenzyme Q10 (COQ-10) 100 MG CAPS Take 1 tablet by mouth daily.      . Fish Oil OIL Take 15 mLs by mouth daily.     Marland Kitchen. MAGNESIUM  CHLORIDE PO Take 1 tablet by mouth daily.      . Multiple Vitamin (MULTIVITAMIN WITH MINERALS) TABS tablet Take 1 tablet by mouth daily.    . vitamin C (ASCORBIC ACID) 500 MG tablet Take 500 mg by mouth daily.     No current facility-administered medications on file prior to visit.    Allergies:  No Known Allergies  Physical Exam General: well developed, well nourished middle-aged Caucasian lady, seated, in no evident distress Head: head normocephalic and atraumatic.   Neck: supple with no carotid or supraclavicular bruits Cardiovascular: regular rate and rhythm, no murmurs Musculoskeletal: no deformity Skin:  no rash/petichiae Vascular:  Normal pulses all extremities  Neurologic Exam Mental Status: Awake and fully alert. Oriented to place and time. Recent and remote memory intact. Attention span, concentration and fund  of knowledge appropriate. Mood and affect appropriate. Mini-Mental status exam scored 30/30 with no deficits  Animal naming test 12. Clock drawing 4/4. Geriatric depression scale 2 only not depressed. Cranial Nerves: Fundoscopic exam . Pupils equal, briskly reactive to light. Extraocular movements full without nystagmus. Visual fields full to confrontation. Hearing intact. Mild left lower facial asymmetry when she smiles. Facial sensation intact. Face, tongue, palate moves normally and symmetrically.  Motor: Normal bulk and tone. Normal strength in all tested extremity muscles. Diminished fine finger movements on the left. Mild left grip weakness. Orbits right over left approximately. Sensory.: intact to touch , pinprick , position and vibratory sensation.  Coordination: Rapid alternating movements normal in all extremities. Finger-to-nose and heel-to-shin performed accurately bilaterally. Head shaking produces no nystagmus or vertigo. Fukuda stepping test is positive with the patient moving his several steps off the base and rotating slightly. Hallpike maneuver was  negative. Gait and Station: Arises from chair without difficulty. Stance is normal. Gait demonstrates normal stride length and balance . Able to heel, toe and tandem walk without difficulty.  Reflexes: 1+ and symmetric. Toes downgoing.       ASSESSMENT: 1 year Caucasian lady with embolic right MCA infarct in June 2016 secondary to a atrial flutter treated with IV TPA and complete recanalization  with intra-arterial TPA and mechanical embolectomy with solitaire device. She has done remarkably well with excellent physical recovery but has mild cognitive complaints which appear to be improving. New brief isolated episode of positional vertigo 04/28/15 likely due to mild peripheral vestibular dysfunction    PLAN: I had a long d/w patient about her recent stroke, risk for recurrent stroke/TIAs, personally independently reviewed imaging studies and stroke evaluation results and answered questions.Continue Eliquis  for secondary stroke prevention for atrial flutter and maintain strict control of hypertension with blood pressure goal below 130/90, diabetes with hemoglobin A1c goal below 6.5% and lipids with LDL cholesterol goal below 70 mg/dL. She had a very brief episode of positional vertigo which may be related to mild peripheral vestibular dysfunction. Since this does not appear to be symptomatic and bothersome at the present time we will hold off on definitive treatment for this at the present time. She was advised to call me if the vertigo reappeared. Followup in the future with me in  6 months or call earlier if necessary,  Delia Heady, MD  Note: This document was prepared with digital dictation and possible smart phrase technology. Any transcriptional errors that result from this process are unintentional.

## 2015-05-06 NOTE — Patient Instructions (Signed)
I had a long d/w patient about her recent stroke, risk for recurrent stroke/TIAs, personally independently reviewed imaging studies and stroke evaluation results and answered questions.Continue Eliquis  for secondary stroke prevention for atrial flutter and maintain strict control of hypertension with blood pressure goal below 130/90, diabetes with hemoglobin A1c goal below 6.5% and lipids with LDL cholesterol goal below 70 mg/dL. She had a very brief episode of positional vertigo which may be related to mild peripheral vestibular dysfunction. Since this does not appear to be symptomatic and bothersome at the present time we will hold off on definitive treatment for this at the present time. She was advised to call me if the vertigo reappeared. Followup in the future with me in  6 months or call earlier if necessary,

## 2015-05-06 NOTE — Progress Notes (Signed)
Martha Clark 05/04/1956 [id number]    History:    Presents for annual exam.  Postmenopausal on no HRT with no bleeding. Normal Pap and mammogram history. Negative colonoscopy 2007. Hospitalized 10/2014 stroke, now on eliquis, history of MVP replacement 2008, had rehabilitation and is doing well. Cardiac ablation 12/2014.  Osteopenia without fracture, DEXA managed by primary care. Has not had Zostavax.  Past medical history, past surgical history, family history and social history were all reviewed and documented in the EPIC chart. Works for [employer], planning retirement December 2017. Martha Clark 19 in college doing well. Martha Clark 16 who is adopted doing well. Father diabetes and stroke. Mother Alzheimer's.  ROS:  A ROS was performed and pertinent positives and negatives are included.  Exam:  Filed Vitals:   05/06/15 0810  BP: 110/80    General appearance:  Normal Thyroid:  Symmetrical, normal in size, without palpable masses or nodularity. Respiratory  Auscultation:  Clear without wheezing or rhonchi Cardiovascular  Auscultation:  Regular rate, without rubs, murmurs or gallops  Edema/varicosities:  Not grossly evident Abdominal  Soft,nontender, without masses, guarding or rebound.  Liver/spleen:  No organomegaly noted  Hernia:  None appreciated  Skin  Inspection:  Grossly normal   Breasts: Examined lying and sitting.     Right: Without masses, retractions, discharge or axillary adenopathy.     Left: Without masses, retractions, discharge or axillary adenopathy. Gentitourinary   Inguinal/mons:  Normal without inguinal adenopathy  External genitalia:  Normal  BUS/Urethra/Skene's glands:  Normal  Vagina:  Normal  Cervix:  Normal  Uterus:  normal in size, shape and contour.  Midline and mobile  Adnexa/parametria:     Rt: Without masses or tenderness.   Lt: Without masses or tenderness.  Anus and perineum: Normal  Digital rectal exam: Normal sphincter tone without palpated masses or  tenderness  Assessment/Plan:  59 y.o. MWF G1 P1 +1 adopted for annual exam.   Postmenopausal/no bleeding/no HRT 10/2014 stroke on Eliquis per neurologist/cardiologist Osteopenia managed by primary care Labs-primary care/cardiologist  Plan: Instructed to get Zostavax this year, discuss Pneumovax with primary care. Home safety, fall prevention and importance of weightbearing exercise reviewed. SBE's, continue annual 3-D mammogram. Repeat colonoscopy next year. Vaginal lubricants with intercourse. Pap normal with negative HR HPV 2015, new screening guidelines reviewed.   Harrington ChallengerYOUNG,Ronne Savoia J [facility] HospitalWHNP, 9:40 AM 05/06/2015

## 2015-05-06 NOTE — Patient Instructions (Signed)

## 2015-10-11 ENCOUNTER — Telehealth: Payer: Self-pay | Admitting: Neurology

## 2015-10-11 NOTE — Telephone Encounter (Addendum)
Pt said she was sitting outside and then all of a sudden she felt like she was spinning for about 10 seconds. She had no other episodes last night or today.  [place] and is inquiring if she is ok to fly. Pt was advised to call PCP. FYI

## 2015-11-04 ENCOUNTER — Ambulatory Visit: Payer: BLUE CROSS/BLUE SHIELD | Admitting: Neurology

## 2015-12-29 ENCOUNTER — Encounter: Payer: Self-pay | Admitting: Neurology

## 2015-12-29 ENCOUNTER — Ambulatory Visit (INDEPENDENT_AMBULATORY_CARE_PROVIDER_SITE_OTHER): Payer: BLUE CROSS/BLUE SHIELD | Admitting: Neurology

## 2015-12-29 VITALS — BP 110/72 | HR 72 | Ht 67.0 in | Wt 123.0 lb

## 2015-12-29 DIAGNOSIS — G3184 Mild cognitive impairment, so stated: Secondary | ICD-10-CM | POA: Diagnosis not present

## 2015-12-29 NOTE — Progress Notes (Signed)
[facility] [address] [place]. [place] [address] [phone]       OFFICE FOLLOW UP VISIT  NOTE  Martha Clark Date of Birth:  01-26-1956 Medical Record Number:  [id number]   Referring MD:  Birdie Sons Reason for Referral:  Stroke f/u  HPI:Martha Clark is a 60 year old Caucasian lady seen today for the first office follow-up visit following hospital admission for stroke in June 2016.Martha Clark is a 60 y.o. female with a past medical history significant for MVP s/p bovine mitral valve replacement, newly diagnosed atrial fibrillation s/p cardioversion in he ER on 6/21, osteoporosis, and asthma, brought in via EMS due to acute onset of left hemiparesis, dysarthria, and left facial weakness. Patient was home with family when Martha Clark fell and called out for her family in the other room and noted patient to have left side paralysis with facial droop and slurred speech. EMS was immediately summoned and patient brought to the ED. Awake and alert at initial evaluation, with NIHSS 15 and CT scan showed no acute abnormality. Martha Clark denied HA, vertigo, double vision, difficulty swallowing, or visual disturbances. No recent bleeding or surgeries. Patient not taking anticoagulants. NIHSS: 15. Martha Clark was last known well 11/21/13 at 1655. Martha Clark was administered IV tPA (50 mg on 11/20/2014 at 1800) followed by neurointervention with IA tPA and Solitaire device leading to TICI 3 revascularization. Martha Clark was admitted to neuro ICU post procedure for further evaluation and treatement. Martha Clark did well after the procedure and was extubated. Martha Clark had mild left-sided weakness and dysmetria which in improve during her hospital stay. MRI scan of the brain showed patchy right basal ganglia infarct as well as smaller portions of frontal lobe. Transthoracic echo showed no cortex as well as embolism and tissue mitral valve was in good position. LDL cholesterol was 76 and hemoglobin A1c was 5.3. Martha Clark was on aspirin  prior to admission given history of recent atrial fibrillation Martha Clark was changed to eliquis. Martha Clark did so well that Martha Clark was discharged home with home physical and occupational therapy. Martha Clark states Martha Clark has continued to do well and physically has practically no deficits. Uneven Martha Clark smiles Martha Clark notices slight left facial asymmetry and some diminished fine motor skills in the left hand. Martha Clark did feel a lot of fatigue and tiredness and felt like her mind was in a fog had trouble concentrating but after undergoing cardiac ablation for atrial flutter for the last week Martha Clark feels a lot better like Martha Clark is a different person. Martha Clark's had a few transient episodes of blurred vision once while shopping in a grocery store when Martha Clark felt lightheaded and woozy but only for a few seconds. On another occasion this happened when Martha Clark was walking but felt better when Martha Clark sat down. Her blood pressure has been running quite low in the 100 range and heart rate has been in the 50s. Martha Clark has appointment to see Dr. Leonides Cave neuropsychologist later today for detailed testing. On Mini-Mental status testing in the office today Martha Clark scored 29/30 with only one deficit in recall. Martha Clark was able to name only 8 animals. Martha Clark wants to go back to work but is willing to wait for neuropsych testing results to come back. Update 05/06/2015 : Martha Clark returns for follow-up after last visit 3 months ago. Martha Clark continues to do well and has not had any recurrent stroke or TIA symptoms. Martha Clark is able to function well at her job but has noticed that her processing time needs increased. Her short-term memory  is also not as sharp. However Martha Clark is compensated quite well and is productive at her job. Martha Clark had a brief episode on 04/28/15 when Martha Clark was bending down to reach something when Martha Clark complained of vertigo. This lasted barely a few seconds present time Martha Clark called her husband to help her Martha Clark was okay. Martha Clark denied a competing nausea, vomiting, blurred vision or headache. Martha Clark has not been in  recurrent atrial flutter since successful catheter ablation. Martha Clark remains on eliquis which is tolerating well without bleeding and only minor bruising. Martha Clark is due for annual physical exam later this month and plans to have lipid profile checked at that time. Her blood pressure is doing well and today it is 124/75. Martha Clark has seen her cardiologist Dr. Jacinto Halim who wonders if Martha Clark can come off the eliquis. Update 12/29/2015 ; Martha Clark returns for follow-up after last visit to 6 months ago. He continues to do well without recurrent stroke or TIA symptoms. The patient also continues to have some cognitive difficulties and finds work to be challenging. 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 Martha Clark Struggles with Numbers and Remembering Recent Information. Martha Clark's Also Noticed Trouble with Writing Though Martha Clark Can Work Quite Well on the [employer]. Martha Clark Plans to Have Right Foot Surgery Done for Bone-On-Bone and Has Questions about Stopping Her Anticoagulation and Stroke Risk. ROS:   14 system review of systems is positive for  memory loss, double vision and all other systems negative  PMH:  Past Medical History:  Diagnosis Date  . 1St degree AV block   . Asthma   . Atrial fibrillation (HCC)   . History of CVA (cerebrovascular accident) 11/20/14  . Mitral valve prolapse   . Stroke [facility])     Social History:  Social History   Social History  . Marital status: Married    Spouse name: N/A  . Number of children: N/A  . Years of education: N/A   Occupational History  . Not on file.   Social History Main Topics  . Smoking status: Never Smoker  . Smokeless tobacco: Never Used  . Alcohol use No  . Drug use: No  . Sexual activity: Not Currently    Birth control/ protection: None   Other Topics Concern  . Not on file   Social History Narrative  . No narrative on file    Medications:   Current Outpatient Prescriptions on  File Prior to Visit  Medication Sig Dispense Refill  . acetaminophen (TYLENOL) 325 MG tablet Take 1,300 mg by mouth every 6 (six) hours as needed for mild pain.    Marland Kitchen apixaban (ELIQUIS) 5 MG TABS tablet Take 1 tablet (5 mg total) by mouth 2 (two) times daily. 60 tablet 12  . buPROPion (WELLBUTRIN XL) 150 MG 24 hr tablet Take 150 mg by mouth Daily.     . calcium citrate-vitamin D (CITRACAL+D) 315-200 MG-UNIT per tablet Take 1 tablet by mouth daily.      . Cholecalciferol (VITAMIN D) 1000 UNITS capsule Take 1,000 Units by mouth daily.      . Coenzyme Q10 (COQ-10) 100 MG CAPS Take 1 tablet by mouth daily.      . Cyanocobalamin (VITAMIN B-12 PO) Take 1 tablet by mouth daily.    . Fish Oil OIL Take 15 mLs by mouth daily.     Marland Kitchen MAGNESIUM CHLORIDE PO Take 1 tablet by mouth daily.      Marland Kitchen  Multiple Vitamin (MULTIVITAMIN WITH MINERALS) TABS tablet Take 1 tablet by mouth daily.     No current facility-administered medications on file prior to visit.     Allergies:  No Known Allergies  Physical Exam General: well developed, well nourished middle-aged Caucasian lady, seated, in no evident distress Head: head normocephalic and atraumatic.   Neck: supple with no carotid or supraclavicular bruits Cardiovascular: regular rate and rhythm, no murmurs Musculoskeletal: no deformity Skin:  no rash/petichiae Vascular:  Normal pulses all extremities  Neurologic Exam Mental Status: Awake and fully alert. Oriented to place and time. Recent and remote memory intact. Attention span, concentration and fund of knowledge appropriate. Mood and affect appropriate. Mini-Mental status exam Not done. Animal naming test 10  . Cranial Nerves: Fundoscopic exam . Pupils equal, briskly reactive to light. Extraocular movements full without nystagmus. Visual fields full to confrontation. Hearing intact. Mild left lower facial asymmetry when Martha Clark smiles. Facial sensation intact. Face, tongue, palate moves normally and symmetrically.    Motor: Normal bulk and tone. Normal strength in all tested extremity muscles. Diminished fine finger movements on the left. Mild left grip weakness. Orbits right over left approximately. Sensory.: intact to touch , pinprick , position and vibratory sensation.  Coordination: Rapid alternating movements normal in all extremities. Finger-to-nose and heel-to-shin performed accurately bilaterally. Head shaking produces no nystagmus or vertigo. Fukuda stepping test is positive with the patient moving his several steps off the base and rotating slightly. Hallpike maneuver was negative. Gait and Station: Arises from chair without difficulty. Stance is normal. Gait demonstrates normal stride length and balance . Able to heel, toe and tandem walk without difficulty.  Reflexes: 1+ and symmetric. Toes downgoing.       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 Martha Clark has done remarkably well with excellent physical recovery but has mild cognitive complaints which appear to be persistent   PLAN: I had a long d/w patient about her remote, mild cognitive impairment stroke, risk for recurrent stroke/TIAs, personally independently reviewed imaging studies and stroke evaluation results and answered questions.Continue Eliquis for secondary stroke prevention for atrial flutter and maintain strict control of hypertension with blood pressure goal below 130/90, diabetes with hemoglobin A1c goal below 6.5% and lipids with LDL cholesterol goal below 70 mg/dL.  We also discussed her mild cognitive impairment which may be related to her stroke and age. I encouraged her to increase participation in cognitively challenging activities like playing bridge, solving crossword puzzles and sudoku. I also recommend Martha Clark start taking Reservetarol 200  mg daily.. Greater than 50% time during this 25 minute visit was spent on counseling and coordination of care about her memory difficulties and stroke risk Followup in the future with me in 1 year or call earlier if  necessary,  Delia Heady, MD  Note: This document was prepared with digital dictation and possible smart phrase technology. Any transcriptional errors that result from this process are unintentional.

## 2015-12-29 NOTE — Patient Instructions (Signed)
I had a long d/w patient about her remote, mild cognitive impairment stroke, risk for recurrent stroke/TIAs, personally independently reviewed imaging studies and stroke evaluation results and answered questions.Continue Eliquis  for secondary stroke prevention for atrial flutter and maintain strict control of hypertension with blood pressure goal below 130/90, diabetes with hemoglobin A1c goal below 6.5% and lipids with LDL cholesterol goal below 70 mg/dL.  We also discussed her mild cognitive impairment which may be related to her stroke and age. I encouraged her to increase participation in cognitively challenging activities like playing bridge, solving crossword puzzles and sudoku. I also recommend she start taking Reservetarol 200 mg daily.. Followup in the future with me in 1 year or call earlier if  necessary,  Memory Compensation Strategies  1. Use "WARM" strategy.  W= write it down  A= associate it  R= repeat it  M= make a mental note  2.   You can keep a Glass blower/designer.  Use a 3-ring notebook with sections for the following: calendar, important names and phone numbers,  medications, doctors' names/phone numbers, lists/reminders, and a section to journal what you did  each day.   3.    Use a calendar to write appointments down.  4.    Write yourself a schedule for the day.  This can be placed on the calendar or in a separate section of the Memory Notebook.  Keeping a  regular schedule can help memory.  5.    Use medication organizer with sections for each day or morning/evening pills.  You may need help loading it  6.    Keep a basket, or pegboard by the door.  Place items that you need to take out with you in the basket or on the pegboard.  You may also want to  include a message board for reminders.  7.    Use sticky notes.  Place sticky notes with reminders in a place where the task is performed.  For example: " turn off the  stove" placed by the stove, "lock the door" placed  on the door at eye level, " take your medications" on  the bathroom mirror or by the place where you normally take your medications.  8.    Use alarms/timers.  Use while cooking to remind yourself to check on food or as a reminder to take your medicine, or as a  reminder to make a call, or as a reminder to perform another task, etc. Mild Neurocognitive Disorder Mild neurocognitive disorder (formerly known as mild cognitive impairment) is a mental disorder. It is a slight abnormal decrease in mental function. The areas of mental function affected may include memory, thought, communication, behavior, and completion of tasks. The decrease is noticeable and measurable but for the most part does not interfere with your daily activities. Mild neurocognitive disorder typically occurs in people older than 60 years but can occur earlier. It is not as serious as major neurocognitive disorder (formerly known as dementia) but may lead to a more serious neurocognitive disorder. However, in some cases the condition does not get worse. A few people with this disorder even improve. CAUSES  There are a number of different causes of mild neurocognitive disorder:   Brain disorders associated with abnormal protein deposits, such as Alzheimer's disease, Pick's disease, and Lewy body disease.  Brain disorders associated with abnormal movement, such as Parkinson's disease and Huntington's disease.  Diseases affecting blood vessels in the brain and resulting in mini-strokes.  Certain infections, such  as human immunodeficiency virus (HIV) infection.  Traumatic brain injury.  Other medical conditions such as brain tumors, underactive thyroid (hypothyroidism), and vitamin B12 deficiency.  Use of certain prescription medicine and "recreational" drugs. SYMPTOMS  Symptoms of mild neurocognitive disorder include:  Difficulty remembering. You may forget details of recent events, names, or phone numbers. You may forget  important social events and appointments or repeatedly forget where you put your car keys.  Difficulty thinking and solving problems. You may have trouble with complex tasks such as paying bills or driving in unfamiliar locations.  Difficulty communicating. You may have trouble finding the right word, naming an object, forming a sentence that makes sense, or understanding what you read or hear.  Changes in your behavior or personality. You may lose interest in the things that you used to enjoy or withdraw from social situations. You may get angry more easily than usual. You may act before thinking. You may do things in public that you would not usually do. You may hear or see things that are not real (hallucinations). You may believe falsely that others are trying to hurt you (paranoia). DIAGNOSIS Mild neurocognitive disorder is diagnosed through an assessment by your health care provider. Your health care provider will ask you and your family, friends, or coworkers questions about your symptoms. He or she will ask how often the symptoms occur, how long they have been occurring, whether they are getting worse, and the effect they are having on your life. Your health care provider may refer you to a neurologist or mental health specialist for a detailed evaluation of your mental functions (neuropsychological testing).  To identify the cause of your mild neurocognitive disorder, your health care provider may:  Obtain a detailed medical history.  Ask about alcohol and drug use, including prescription medicine.  Perform a physical exam.  Order blood tests and brain imaging exams. TREATMENT  Mild neurocognitive disorder caused by infections, use of certain medicines or "recreational" drugs, and certain medical conditions may improve with treatment of the condition that is causing the disorder. Mild neurocognitive disorder resulting from other causes generally does not improve and may worsen. In  these cases, the goal of treatment is to slow progression of the disorder and help you cope with the loss of mental function. Treatments in these cases include:   Medicine. Medicine helps mainly with memory loss and behavioral symptoms.   Talk therapy. Talk therapy provides education, emotional support, memory aids, and other ways of making up for decreases in mental function.   Lifestyle changes. These include regular exercise, a healthy diet (including essential omega-3 fatty acids), intellectual stimulation, and increased social interaction.   This information is not intended to replace advice given to you by your health care provider. Make sure you discuss any questions you have with your health care provider.   Document Released: 01/14/2013 Document Revised: 06/04/2014 Document Reviewed: 01/14/2013 Elsevier Interactive Patient Education [employer]

## 2016-01-25 DIAGNOSIS — Z0289 Encounter for other administrative examinations: Secondary | ICD-10-CM

## 2016-02-27 ENCOUNTER — Encounter: Payer: Self-pay | Admitting: Gynecology

## 2016-03-05 NOTE — Progress Notes (Signed)
Clearance form filled out and fax back to  [phone]. Fax twice and receive.

## 2016-05-10 ENCOUNTER — Ambulatory Visit (INDEPENDENT_AMBULATORY_CARE_PROVIDER_SITE_OTHER): Payer: BLUE CROSS/BLUE SHIELD | Admitting: Women's Health

## 2016-05-10 ENCOUNTER — Encounter: Payer: Self-pay | Admitting: Women's Health

## 2016-05-10 VITALS — BP 118/80 | Ht 67.0 in | Wt 124.0 lb

## 2016-05-10 DIAGNOSIS — Z01419 Encounter for gynecological examination (general) (routine) without abnormal findings: Secondary | ICD-10-CM | POA: Diagnosis not present

## 2016-05-10 NOTE — Progress Notes (Signed)
Martha Clark 07/27/1955 [id number]    History:    Presents for annual exam. Postmenopausal on no HRT with no bleeding. Normal  Pap and mammogram history. Not sexually active/husbands health. Osteoporosis without a fracture managed by primary care. 10/2014 stroke from A. Fib, had an ablation and has been good ever since. 2007 negative colonoscopy: Repeat colonoscopy is scheduled for January. Has had Zostavax.   Past medical history, past surgical history, family history and social history were all reviewed and documented in the EPIC chart. Retired this week from [employer] happy with decision. Martha Clark 17 doing well at the [employer], son Martha Clark is graduating from [employer] this year.  ROS:  A ROS was performed and pertinent positives and negatives are included.  Exam:  Vitals:   05/10/16 0821  BP: 118/80  Weight: 124 lb (56.2 kg)  Height: 5\' 7"  (1.702 m)   Body mass index is 19.42 kg/m.   General appearance:  Normal Thyroid:  Symmetrical, normal in size, without palpable masses or nodularity. Respiratory  Auscultation:  Clear without wheezing or rhonchi Cardiovascular  Auscultation:  Regular rate, without rubs, murmurs or gallops  Edema/varicosities:  Not grossly evident Abdominal  Soft,nontender, without masses, guarding or rebound.  Liver/spleen:  No organomegaly noted  Hernia:  None appreciated  Skin  Inspection:  Grossly normal   Breasts: Examined lying and sitting.     Right: Without masses, retractions, discharge or axillary adenopathy.     Left: Without masses, retractions, discharge or axillary adenopathy. Gentitourinary   Inguinal/mons:  Normal without inguinal adenopathy  External genitalia:  Normal  BUS/UrethraNormal              Cervix:  Normal /Skene's glands:  Normal  Vagina: Atrophic  Uterus:   normal in size, shape and contour.  Midline and mobile  Adnexa/parametria:     Rt: Without masses or tenderness.   Lt: Without masses or tenderness.  Anus and  perineum: Normal  Digital rectal exam: Normal sphincter tone without palpated masses or tenderness  Assessment/Plan:  60 y.o. M WF G1 P1 +1 adopted for annual exam no complaints.  Postmenopausal/no HRT/no bleeding 10/2014 stroke from A. fib ablation 2008 MVP valve replacement Osteoporosis primary care manages labs and meds  Plan: Home safety, fall prevention and importance of continuing regular weightbearing exercise. SBE's, continue annual 3-D screening mammogram history of dense breasts. Keep scheduled repeat colonoscopy appointment. UA, Pap normal 2015 with negative HR HPV, new screening guidelines reviewed.    Harrington ChallengerYOUNG,Martha Clark, 9:37 AM 05/10/2016

## 2016-05-10 NOTE — Patient Instructions (Signed)

## 2016-09-05 ENCOUNTER — Ambulatory Visit (INDEPENDENT_AMBULATORY_CARE_PROVIDER_SITE_OTHER): Payer: BLUE CROSS/BLUE SHIELD | Admitting: Women's Health

## 2016-09-05 ENCOUNTER — Encounter: Payer: Self-pay | Admitting: Women's Health

## 2016-09-05 VITALS — BP 115/73

## 2016-09-05 DIAGNOSIS — R350 Frequency of micturition: Secondary | ICD-10-CM | POA: Diagnosis not present

## 2016-09-05 DIAGNOSIS — N898 Other specified noninflammatory disorders of vagina: Secondary | ICD-10-CM

## 2016-09-05 LAB — URINALYSIS W MICROSCOPIC + REFLEX CULTURE
Bilirubin Urine: NEGATIVE
Casts: NONE SEEN [LPF]
Crystals: NONE SEEN [HPF]
Glucose, UA: NEGATIVE
Ketones, ur: NEGATIVE
Leukocytes, UA: NEGATIVE
Nitrite: NEGATIVE
Protein, ur: NEGATIVE
Specific Gravity, Urine: 1.01 (ref 1.001–1.035)
Yeast: NONE SEEN [HPF]
pH: 5 (ref 5.0–8.0)

## 2016-09-05 LAB — WET PREP FOR TRICH, YEAST, CLUE
Clue Cells Wet Prep HPF POC: NONE SEEN
Trich, Wet Prep: NONE SEEN
Yeast Wet Prep HPF POC: NONE SEEN

## 2016-09-05 MED ORDER — ESTRADIOL 0.1 MG/GM VA CREA: 1.0000 | TOPICAL_CREAM | Freq: Every day | VAGINAL | 12 refills | Status: DC

## 2016-09-05 NOTE — Progress Notes (Signed)
HPI: Presents with urgency with hesitancy after first morning void, voiding small amounts and mild itching for 2 weeks. Denies burning or pain with urination, abdominal pain, back pain, or fever. Postmenopausal on no HRT with no bleeding. History of A. fib/stroke on Eliquis doing well.  Exam: Appears well. External genitalia within normal limits. Speculum exam mild erythema and dryness of vaginal walls with no discharge, blood or odor. No CMT or adnexal tenderness. Negative wet prep UA trace blood, negative nitrites and leukocytes, few bacteria, 0-5 WBCs, 0 - 2 RBCs, few bacteria.  Urinary frequency with urgency and hesitation  Plan: Urine culture pending. Options reviewed. Estradiol 0.1 mg cream to use externally twice weekly small amount prescribed.. Minimal systemic absorption slight risk for blood clots and strokes. Encouraged increased daily fluid intake. Discussed UTI prevention. Instructed to call if continued problems.

## 2016-09-06 LAB — URINE CULTURE

## 2016-10-10 ENCOUNTER — Encounter: Payer: Self-pay | Admitting: Gynecology

## 2017-01-05 IMAGING — CR DG CHEST 1V PORT
1 series · 1 of 1 positions shown · non-contrast
Comparison: Chest radiograph performed 09/27/2011

CLINICAL DATA: Acute onset of chest discomfort and shortness of
breath. Initial encounter.

EXAM:
PORTABLE CHEST - 1 VIEW

[AP]
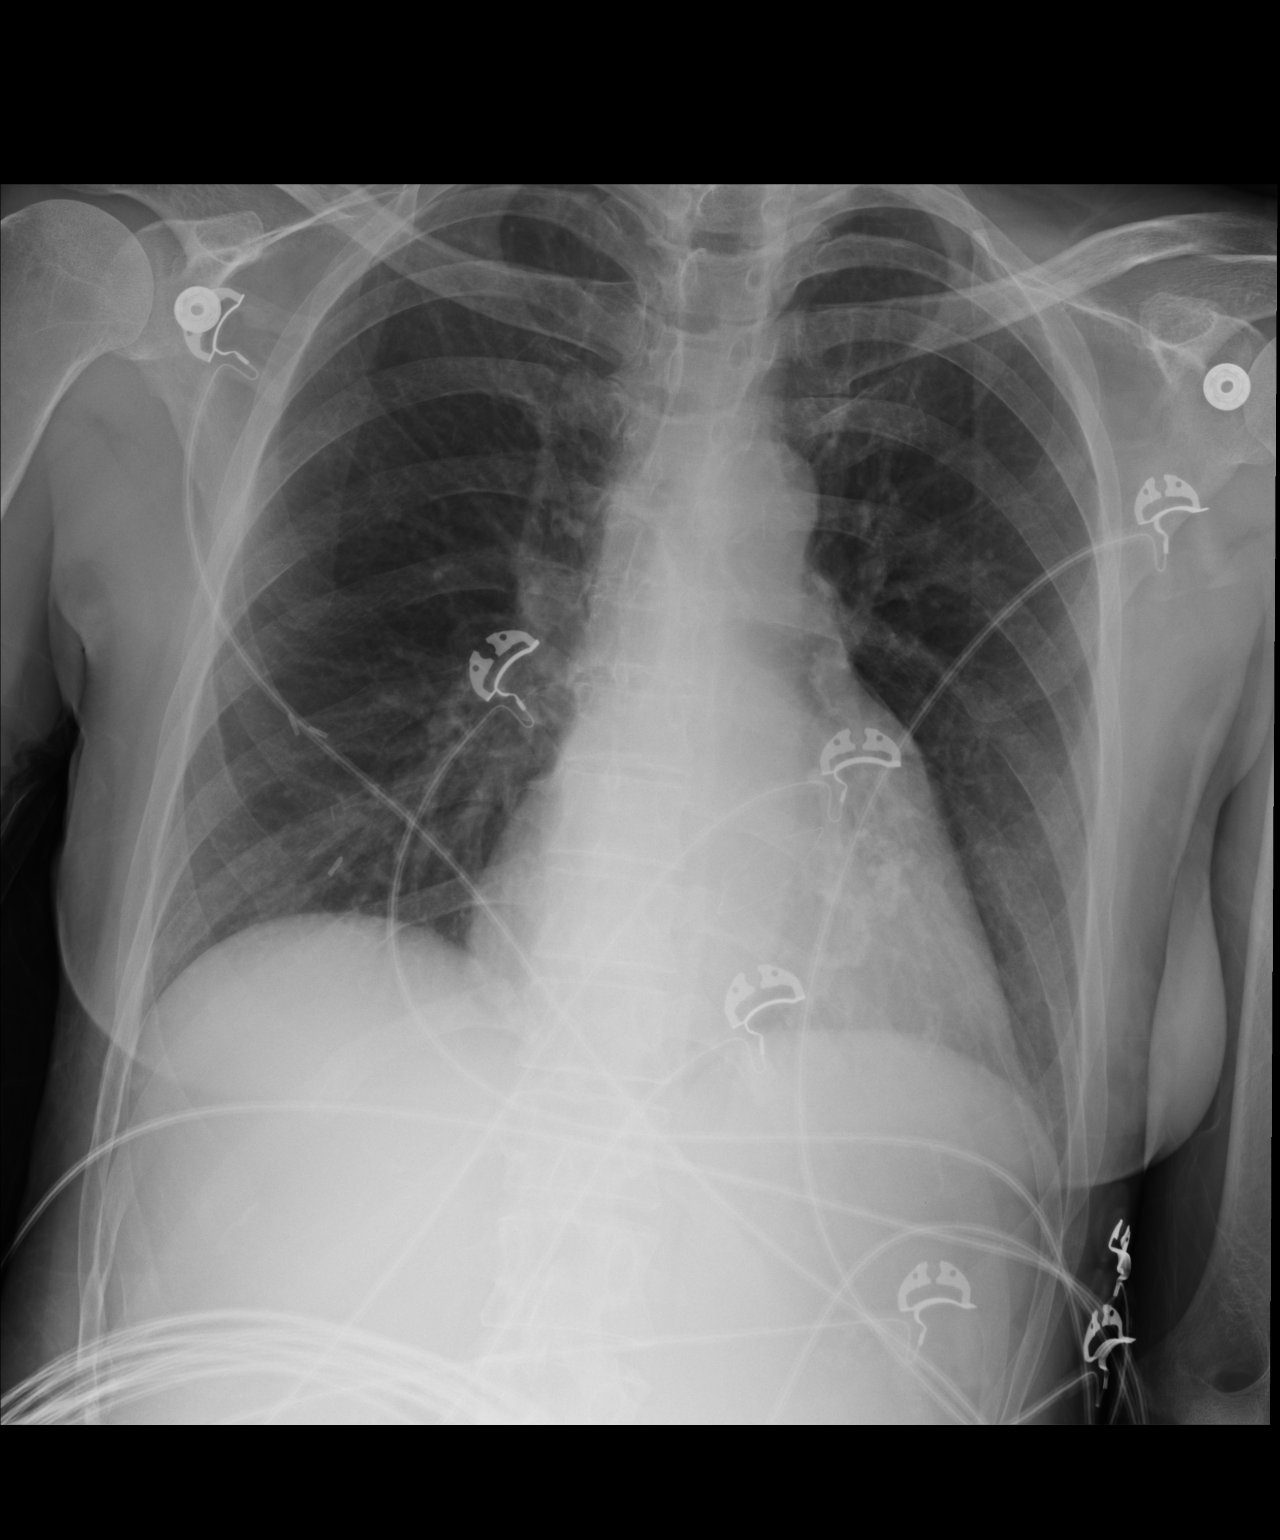

[1 of 1 positions shown; findings below may reference images not displayed]

FINDINGS: The lungs are well-aerated and clear. There is no evidence of focal
opacification, pleural effusion or pneumothorax.

The cardiomediastinal silhouette is borderline enlarged. No acute
osseous abnormalities are seen. Postoperative change is noted
overlying the right lower lung zone.
IMPRESSION: Borderline cardiomegaly.  No acute cardiopulmonary process seen.

## 2017-01-07 ENCOUNTER — Ambulatory Visit: Payer: BLUE CROSS/BLUE SHIELD | Admitting: Neurology

## 2017-01-09 IMAGING — CT CT HEAD W/O CM
1 series · 16 of 30 positions shown, 20 images · non-contrast
Comparison: Head CT earlier today

CLINICAL DATA: Stroke. Sudden onset left-sided weakness, left
facial droop, and slurred speech. Status post cerebral angiogram and
revascularization of occluded right MCA.

EXAM:
CT HEAD WITHOUT CONTRAST
TECHNIQUE: Contiguous axial images were obtained from the base of the skull
through the vertex without intravenous contrast.

[Series 2: head 5.0 h30s · axial · 0.40mm/px · z∈[+1179,+1329]mm · 16 of 34 slices shown, 20 images]
[im 2/34  brain]
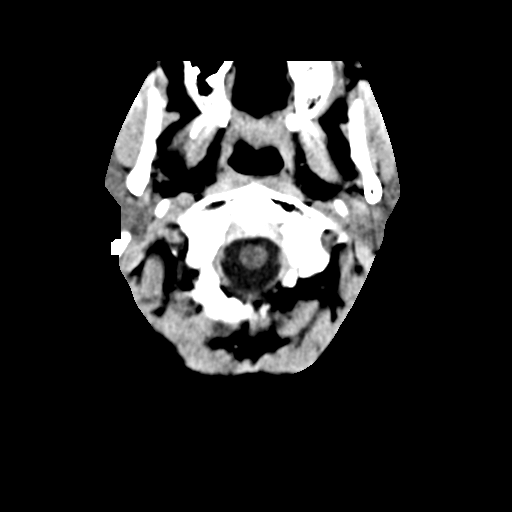
[im 2/34  bone]
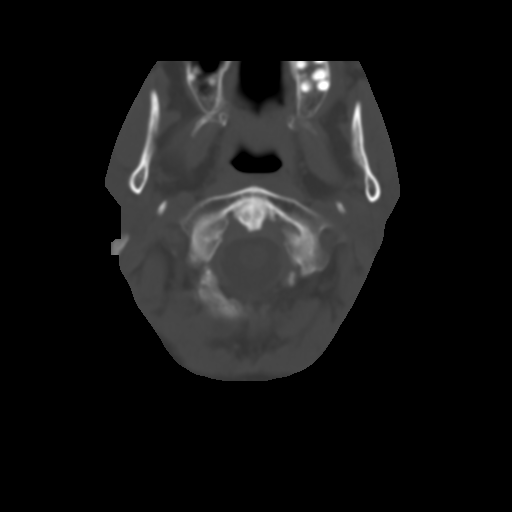
[im 4/34  brain]
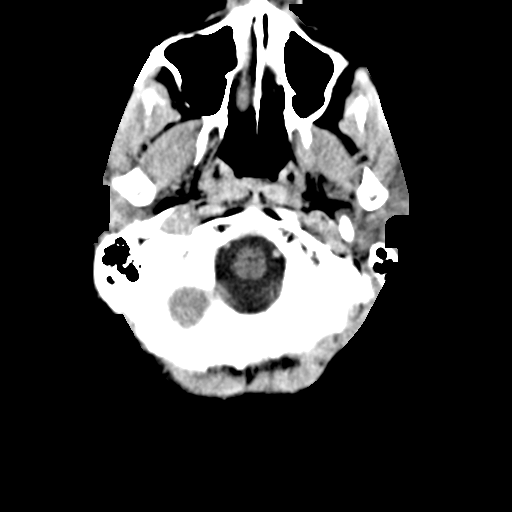
[im 6/34  brain]
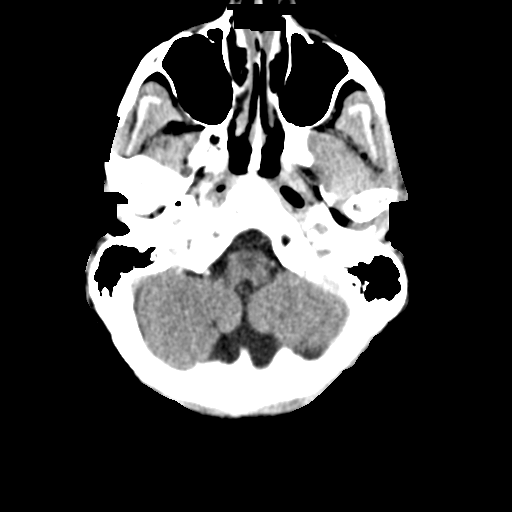
[im 8/34  brain]
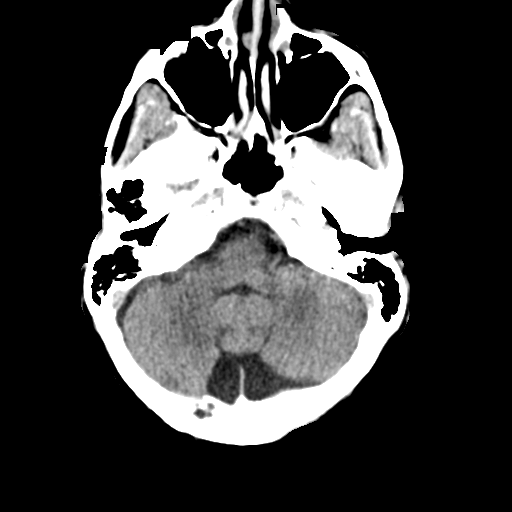
[im 10/34  brain]
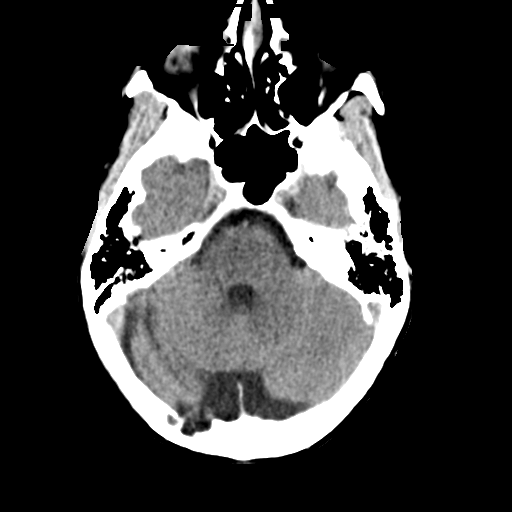
[im 10/34  bone]
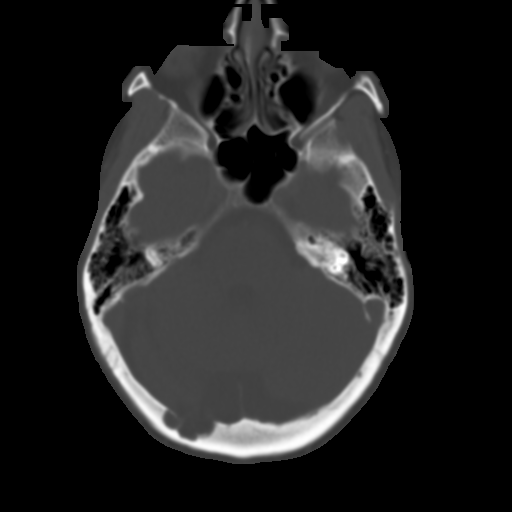
[im 12/34  brain]
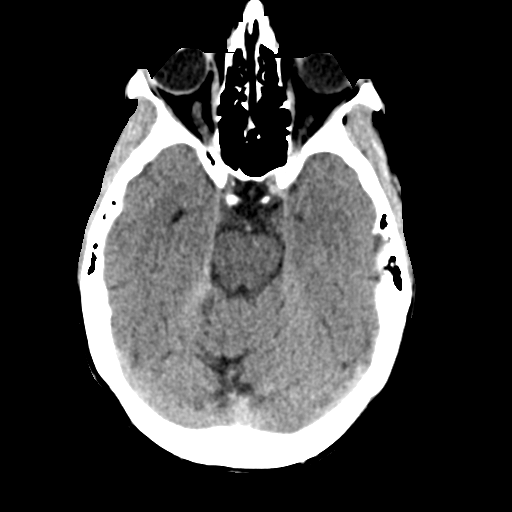
[im 14/34  brain]
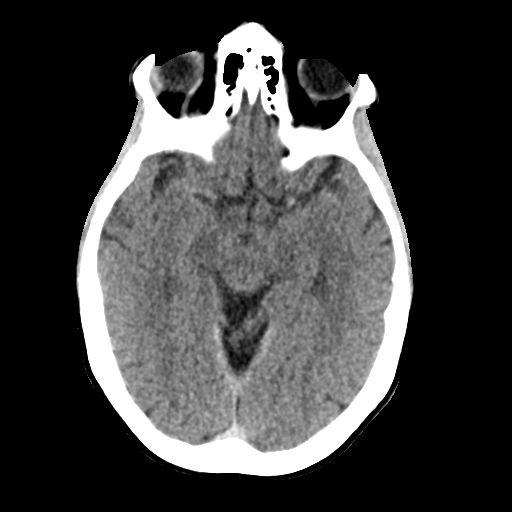
[im 16/34  brain]
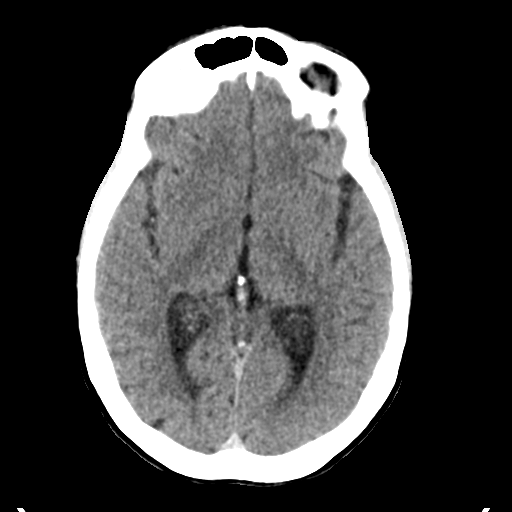
[im 18/34  brain]
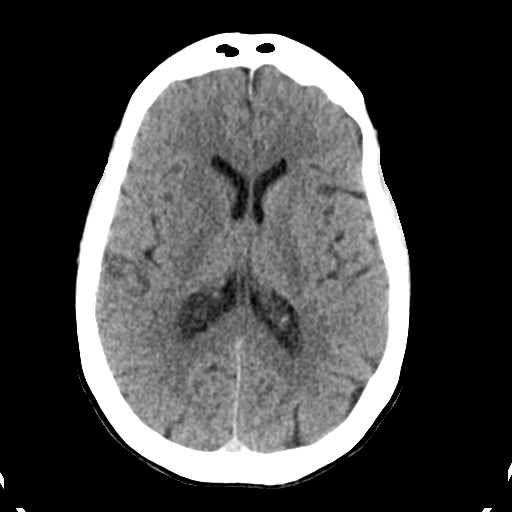
[im 18/34  bone]
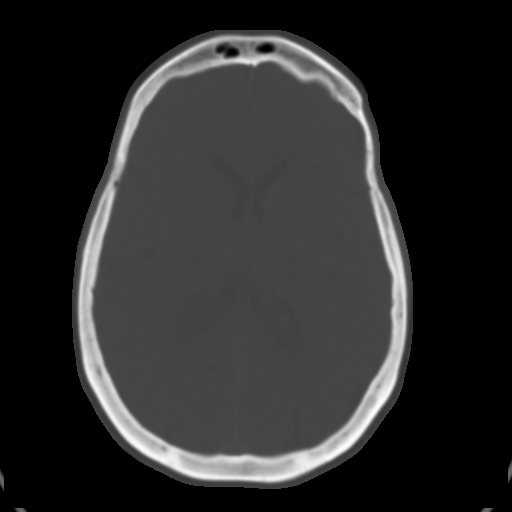
[im 20/34  brain]
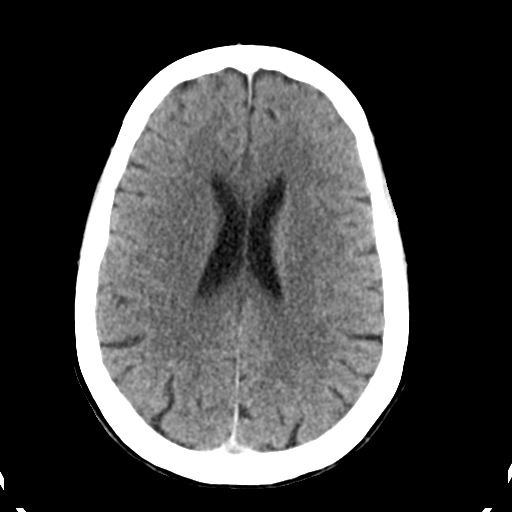
[im 22/34  brain]
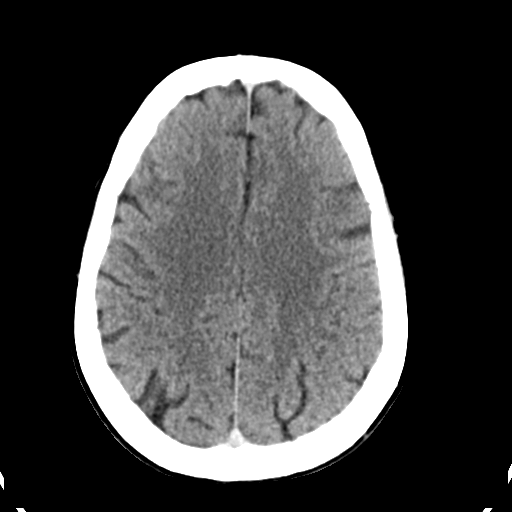
[im 24/34  brain]
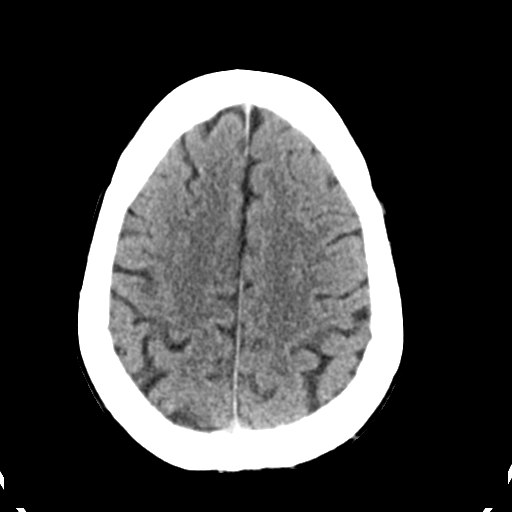
[im 26/34  brain]
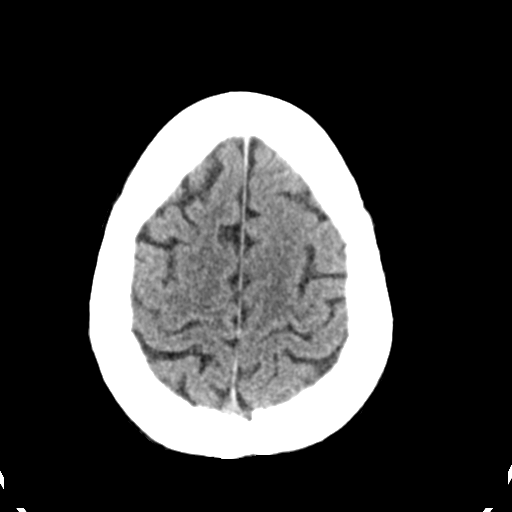
[im 26/34  bone]
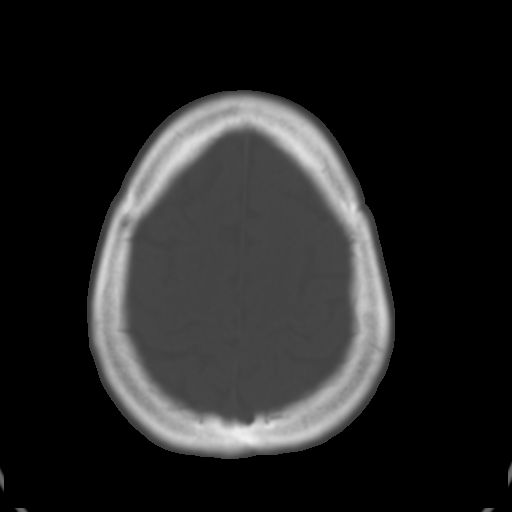
[im 28/34  brain]
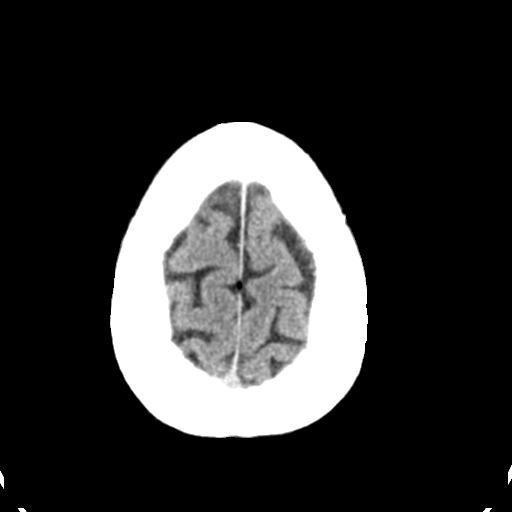
[im 30/34  brain]
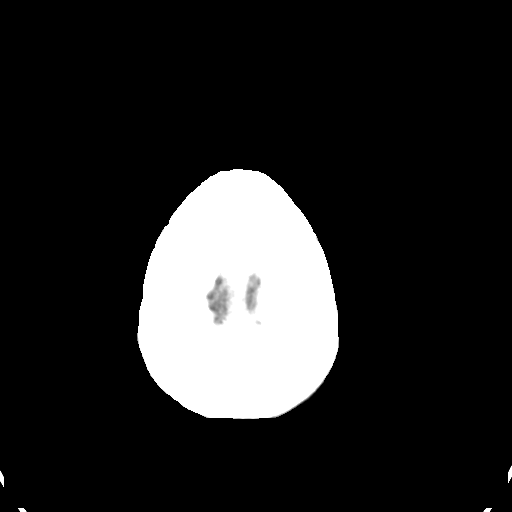
[im 32/34  brain]
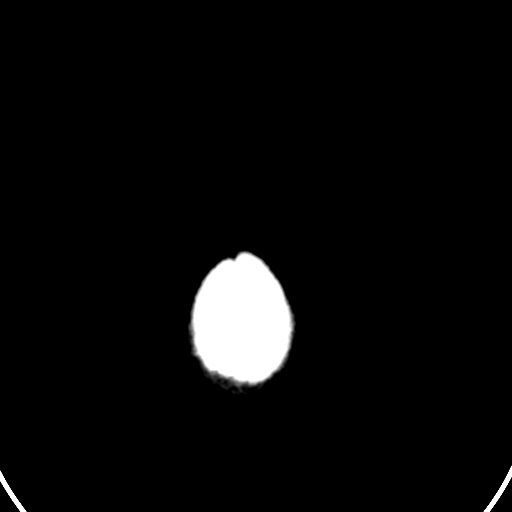

[16 of 30 positions shown; findings below may reference images not displayed]

FINDINGS: Residual vascular contrast is noted from recent cerebral angiogram.
Ventricles and sulci are normal. There is new, subtle decreased
attenuation involving the right caudate and anterior right lentiform
nuclei consistent with early cytotoxic edema. No definite cortical
edema is identified. There is no evidence of acute intracranial
hemorrhage, mass, midline shift, or extra-axial fluid collection.

Orbits are unremarkable. Mastoid air cells are clear. Trace left
maxillary sinus mucosal thickening is noted.
IMPRESSION: 1. Subtle edema in the right basal ganglia consistent with acute
infarction.
2. No intracranial hemorrhage.

## 2017-01-18 ENCOUNTER — Other Ambulatory Visit: Payer: Self-pay | Admitting: Allergy and Immunology

## 2017-01-18 ENCOUNTER — Ambulatory Visit
Admission: RE | Admit: 2017-01-18 | Discharge: 2017-01-18 | Disposition: A | Discharge status: Home / Self Care | Payer: BLUE CROSS/BLUE SHIELD | Source: Ambulatory Visit | Attending: Allergy and Immunology | Admitting: Allergy and Immunology

## 2017-01-18 DIAGNOSIS — R059 Cough, unspecified: Secondary | ICD-10-CM

## 2017-01-18 DIAGNOSIS — R05 Cough: Secondary | ICD-10-CM

## 2017-01-21 ENCOUNTER — Ambulatory Visit: Payer: BLUE CROSS/BLUE SHIELD | Admitting: Neurology

## 2017-02-20 ENCOUNTER — Encounter: Payer: Self-pay | Admitting: Neurology

## 2017-02-20 ENCOUNTER — Ambulatory Visit (INDEPENDENT_AMBULATORY_CARE_PROVIDER_SITE_OTHER): Payer: BLUE CROSS/BLUE SHIELD | Admitting: Neurology

## 2017-02-20 ENCOUNTER — Encounter (INDEPENDENT_AMBULATORY_CARE_PROVIDER_SITE_OTHER): Payer: Self-pay

## 2017-02-20 VITALS — BP 103/62 | HR 72 | Ht 67.0 in | Wt 124.6 lb

## 2017-02-20 DIAGNOSIS — G3184 Mild cognitive impairment, so stated: Secondary | ICD-10-CM

## 2017-02-20 MED ORDER — BUPROPION HCL ER (XL) 150 MG PO TB24: 150.0000 mg | ORAL_TABLET | Freq: Every day | ORAL | 3 refills | Status: DC

## 2017-02-20 MED ORDER — ROSUVASTATIN CALCIUM 5 MG PO TABS: 2.5000 mg | ORAL_TABLET | Freq: Every day | ORAL | 3 refills | Status: DC

## 2017-02-20 NOTE — Progress Notes (Signed)
[facility] [address] [place]. [place] [address] [phone]       OFFICE FOLLOW UP VISIT  NOTE  Martha. Martha Clark Date of Birth:  June 20, 1955 Medical Record Number:  [id number]   Referring MD:  Birdie Sons Reason for Referral:  Stroke f/u  HPI:Martha Clark is a 61 year old Caucasian lady seen today for the first office follow-up visit following hospital admission for stroke in June 2016.Martha Clark is a 61 y.o. female with a past medical history significant for MVP s/p bovine mitral valve replacement, newly diagnosed atrial fibrillation s/p cardioversion in he ER on 6/21, osteoporosis, and asthma, brought in via EMS due to acute onset of left hemiparesis, dysarthria, and left facial weakness. Patient was home with family when she fell and called out for her family in the other room and noted patient to have left side paralysis with facial droop and slurred speech. EMS was immediately summoned and patient brought to the ED. Awake and alert at initial evaluation, with NIHSS 15 and CT scan showed no acute abnormality. She denied HA, vertigo, double vision, difficulty swallowing, or visual disturbances. No recent bleeding or surgeries. Patient not taking anticoagulants. NIHSS: 15. She was last known well 11/21/13 at 1655. She was administered IV tPA (50 mg on 11/20/2014 at 1800) followed by neurointervention with IA tPA and Solitaire device leading to TICI 3 revascularization. She was admitted to neuro ICU post procedure for further evaluation and treatement. She did well after the procedure and was extubated. She had mild left-sided weakness and dysmetria which in improve during her hospital stay. MRI scan of the brain showed patchy right basal ganglia infarct as well as smaller portions of frontal lobe. Transthoracic echo showed no cortex as well as embolism and tissue mitral valve was in good position. LDL cholesterol was 76 and hemoglobin A1c was 5.3. She was on aspirin  prior to admission given history of recent atrial fibrillation she was changed to eliquis. She did so well that she was discharged home with home physical and occupational therapy. She states she has continued to do well and physically has practically no deficits. Uneven she smiles she notices slight left facial asymmetry and some diminished fine motor skills in the left hand. She did feel a lot of fatigue and tiredness and felt like her mind was in a fog had trouble concentrating but after undergoing cardiac ablation for atrial flutter for the last week she feels a lot better like she is a different person. She's had a few transient episodes of blurred vision once while shopping in a grocery store when she felt lightheaded and woozy but only for a few seconds. On another occasion this happened when she was walking but felt better when she sat down. Her blood pressure has been running quite low in the 100 range and heart rate has been in the 50s. She has appointment to see Dr. Leonides Cave neuropsychologist later today for detailed testing. On Mini-Mental status testing in the office today she scored 29/30 with only one deficit in recall. She was able to name only 8 animals. She wants to go back to work but is willing to wait for neuropsych testing results to come back. Update 05/06/2015 : She returns for follow-up after last visit 3 months ago. She continues to do well and has not had any recurrent stroke or TIA symptoms. She is able to function well at her job but has noticed that her processing time needs increased. Her short-term memory  is also not as sharp. However she is compensated quite well and is productive at her job. She had a brief episode on 04/28/15 when she was bending down to reach something when she complained of vertigo. This lasted barely a few seconds present time she called her husband to help her she was okay. She denied a competing nausea, vomiting, blurred vision or headache. She has not been in  recurrent atrial flutter since successful catheter ablation. She remains on eliquis which is tolerating well without bleeding and only minor bruising. She is due for annual physical exam later this month and plans to have lipid profile checked at that time. Her blood pressure is doing well and today it is 124/75. She has seen her cardiologist Dr. Jacinto Halim who wonders if she can come off the eliquis. Update 12/29/2015 ; she returns for follow-up after last visit to 6 months ago. He continues to do well without recurrent stroke or TIA symptoms. The patient also continues to have some cognitive difficulties and finds work to be challenging. 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 She Struggles with Numbers and Remembering Recent Information. She's Also Noticed Trouble with Writing Though She Can Work Quite Well on the [employer]. She Plans to Have Right Foot Surgery Done for Bone-On-Bone and Has Questions about Stopping Her Anticoagulation and Stroke Risk. Update 02/20/2017 ; She returns for follow-up after last visit a year ago. She continues to do well from stroke standpoint without recurrent TIA or stroke symptoms. She is tolerating eliquis well without bruising or bleeding. She had lipid profile checked on 12/03/16 which shows total cholesterol 213, LDL 141 and HDL 64 mg percent. She is currently not on a statin as she is scared about possible side effects. She has occasional transient blurred vision and double vision particularly when she moves her head suddenly. She has seen optometrist a few times but does have an appointment to see her ophthalmologist the next month. She is been quite active she has joined a Theatre manager at [employer].. She reads a lot.  it. 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 She does continue to have mild short-term memory difficulties but these do not appear to be getting worse. She does  write things down. She is worried about Alzheimer's since her mother had it. The patient was on Wellbutrin for 20 years but discontinued it last year. Since then she's noticed that she gets emotional and cries a lot inappropriately. She is wondering if she needs to go back on it. ROS:   14 system review of systems is positive for  memory loss, double vision , blurred vision, emotional incontinence and all other systems negative  PMH:  Past Medical History:  Diagnosis Date  . 1st degree AV block   . Asthma   . Atrial fibrillation (HCC)   . History of CVA (cerebrovascular accident) 11/20/14  . Mitral valve prolapse   . Stroke [facility])     Social History:  Social History   Social History  . Marital status: Married    Spouse name: N/A  . Number of children: N/A  . Years of education: N/A   Occupational History  . Not on file.   Social History Main Topics  . Smoking status: Never Smoker  . Smokeless tobacco: Never Used  . Alcohol use No  . Drug use: No  . Sexual activity: Not Currently  Birth control/ protection: None   Other Topics Concern  . Not on file   Social History Narrative  . No narrative on file    Medications:   Current Outpatient Prescriptions on File Prior to Visit  Medication Sig Dispense Refill  . acetaminophen (TYLENOL) 325 MG tablet Take 1,300 mg by mouth every 6 (six) hours as needed for mild pain.    Marland Kitchen apixaban (ELIQUIS) 5 MG TABS tablet Take 1 tablet (5 mg total) by mouth 2 (two) times daily. 60 tablet 12  . calcium citrate-vitamin D (CITRACAL+D) 315-200 MG-UNIT per tablet Take 1 tablet by mouth daily.      . Cholecalciferol (VITAMIN D) 1000 UNITS capsule Take 1,000 Units by mouth daily.      . Coenzyme Q10 (COQ-10) 100 MG CAPS Take 2 tablets by mouth daily.      . Cyanocobalamin (VITAMIN B-12 PO) Take 1 tablet by mouth daily.    Marland Kitchen estradiol (ESTRACE VAGINAL) 0.1 MG/GM vaginal cream Place 1 Applicatorful vaginally at bedtime. 42.5 g 12  . MAGNESIUM  CHLORIDE PO Take 1 tablet by mouth daily.      . meclizine (ANTIVERT) 25 MG tablet     . Multiple Vitamin (MULTIVITAMIN WITH MINERALS) TABS tablet Take 1 tablet by mouth daily.    . vitamin C (ASCORBIC ACID) 500 MG tablet Take by mouth.     No current facility-administered medications on file prior to visit.     Allergies:  No Known Allergies  Physical Exam General: well developed, well nourished middle-aged Caucasian lady, seated, in no evident distress Head: head normocephalic and atraumatic.   Neck: supple with no carotid or supraclavicular bruits Cardiovascular: regular rate and rhythm, no murmurs Musculoskeletal: no deformity Skin:  no rash/petichiae Vascular:  Normal pulses all extremities  Neurologic Exam Mental Status: Awake and fully alert. Oriented to place and time. Recent and remote memory intact. Attention span, concentration and fund of knowledge appropriate. Mood and affect appropriate. Mini-Mental status exam Not done. Recall 3/3. Animal naming test 11  . Cranial Nerves: Fundoscopic exam . Pupils equal, briskly reactive to light. Extraocular movements full without nystagmus. Visual fields full to confrontation. Hearing intact. Mild left lower facial asymmetry when she smiles. Facial sensation intact. Face, tongue, palate moves normally and symmetrically.  Motor: Normal bulk and tone. Normal strength in all tested extremity muscles. Diminished fine finger movements on the left. Mild left grip weakness. Orbits right over left approximately. Sensory.: intact to touch , pinprick , position and vibratory sensation.  Coordination: Rapid alternating movements normal in all extremities. Finger-to-nose and heel-to-shin performed accurately bilaterally. Head shaking produces no nystagmus or vertigo. Fukuda stepping test is positive with the patient moving his several steps off the base and rotating slightly. Hallpike maneuver was negative. Gait and Station: Arises from chair without  difficulty. Stance is normal. Gait demonstrates normal stride length and balance . Able to heel, toe and tandem walk without difficulty.  Reflexes: 1+ and symmetric. Toes downgoing.       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 She has done remarkably well with excellent physical recovery but has mild cognitive complaints which appear to be stable PLAN: I had a long discussion with the patient regarding her remote stroke as well as mild cognitive impairment both of which appear to be stable. She is having  some symptoms of emotional incontinence which may be related to mild suboptimal depression. I recommend she start back on Wellbutrin XL 150 mg daily. Continue eliquis for stroke prevention and start Crestor 2.5 mg daily and increase CoQ10 to  200 mg daily to aim for LDL cholesterol goal below 70 mg percent. I have encouraged her to continue participation in cognitively challenging activities like solving crossword puzzles, playing sudoku and bridge. She will return for follow-up in a year or call earlier if necessary.. Greater than 50% time during this 25 minute visit was spent on counseling and coordination of care about her memory difficulties and stroke risk Followup in the future with me in 1 year or call earlier if  necessary,  Delia Heady, MD  Note: This document was prepared with digital dictation and possible smart phrase technology. Any transcriptional errors that result from this process are unintentional.

## 2017-02-20 NOTE — Patient Instructions (Signed)
I had a long discussion with the patient regarding her remote stroke as well as mild cognitive impairment both of which appear to be stable. She is having some symptoms of emotional incontinence which may be related to mild suboptimal depression. I recommend she start back on Wellbutrin XL 150 mg daily. Continue eliquis for stroke prevention and start Crestor 2.5 mg daily and increase CoQ10 to  200 mg daily to aim for LDL cholesterol goal below 70 mg percent. I have encouraged her to continue participation in cognitively challenging activities like solving crossword puzzles, playing sudoku and bridge. She will return for follow-up in a year or call earlier if necessary.

## 2017-03-19 ENCOUNTER — Encounter: Payer: Self-pay | Admitting: Women's Health

## 2017-04-30 ENCOUNTER — Telehealth: Payer: Self-pay

## 2017-04-30 NOTE — Telephone Encounter (Signed)
Rn call patient to give her Dr. Pearlean BrownieSethi advice. Rn stated per Dr. Pearlean BrownieSethi to avoid jerking the neck neck and doing sudden movements of the neck. Pt verbalized understanding.

## 2017-04-30 NOTE — Telephone Encounter (Signed)
Patient called wanting Dr. Marlis EdelsonSethi's opinion on Chiropractors. She has been seeing one and actually has an appt today at 2, but she and her husband recently read that it could cause you to have a stroke. She would like to discuss.

## 2017-04-30 NOTE — Telephone Encounter (Signed)
Rn spoke with Dr. Pearlean BrownieSethi about pt having concerns seeing her chiropractor because of risk of another stroke.Per Dr.Sethi its okay if she sees the chiropractor but to avoid jerking the neck and doing sudden movements with the neck.

## 2017-05-13 ENCOUNTER — Ambulatory Visit (INDEPENDENT_AMBULATORY_CARE_PROVIDER_SITE_OTHER): Payer: BLUE CROSS/BLUE SHIELD | Admitting: Women's Health

## 2017-05-13 ENCOUNTER — Encounter: Payer: Self-pay | Admitting: Women's Health

## 2017-05-13 VITALS — BP 118/80 | Ht 67.0 in | Wt 120.0 lb

## 2017-05-13 DIAGNOSIS — Z01419 Encounter for gynecological examination (general) (routine) without abnormal findings: Secondary | ICD-10-CM | POA: Diagnosis not present

## 2017-05-13 NOTE — Patient Instructions (Signed)
Health Maintenance for Postmenopausal Women Menopause is a normal process in which your reproductive ability comes to an end. This process happens gradually over a span of months to years, usually between the ages of 7 and 62. Menopause is complete when you have missed 12 consecutive menstrual periods. It is important to talk with your health care provider about some of the most common conditions that affect postmenopausal women, such as heart disease, cancer, and bone loss (osteoporosis). Adopting a healthy lifestyle and getting preventive care can help to promote your health and wellness. Those actions can also lower your chances of developing some of these common conditions. What should I know about menopause? During menopause, you may experience a number of symptoms, such as:  Moderate-to-severe hot flashes.  Night sweats.  Decrease in sex drive.  Mood swings.  Headaches.  Tiredness.  Irritability.  Memory problems.  Insomnia.  Choosing to treat or not to treat menopausal changes is an individual decision that you make with your health care provider. What should I know about hormone replacement therapy and supplements? Hormone therapy products are effective for treating symptoms that are associated with menopause, such as hot flashes and night sweats. Hormone replacement carries certain risks, especially as you become older. If you are thinking about using estrogen or estrogen with progestin treatments, discuss the benefits and risks with your health care provider. What should I know about heart disease and stroke? Heart disease, heart attack, and stroke become more likely as you age. This may be due, in part, to the hormonal changes that your body experiences during menopause. These can affect how your body processes dietary fats, triglycerides, and cholesterol. Heart attack and stroke are both medical emergencies. There are many things that you can do to help prevent heart disease  and stroke:  Have your blood pressure checked at least every 1-2 years. High blood pressure causes heart disease and increases the risk of stroke.  If you are 80-6 years old, ask your health care provider if you should take aspirin to prevent a heart attack or a stroke.  Do not use any tobacco products, including cigarettes, chewing tobacco, or electronic cigarettes. If you need help quitting, ask your health care provider.  It is important to eat a healthy diet and maintain a healthy weight. ? Be sure to include plenty of vegetables, fruits, low-fat dairy products, and lean protein. ? Avoid eating foods that are high in solid fats, added sugars, or salt (sodium).  Get regular exercise. This is one of the most important things that you can do for your health. ? Try to exercise for at least 150 minutes each week. The type of exercise that you do should increase your heart rate and make you sweat. This is known as moderate-intensity exercise. ? Try to do strengthening exercises at least twice each week. Do these in addition to the moderate-intensity exercise.  Know your numbers.Ask your health care provider to check your cholesterol and your blood glucose. Continue to have your blood tested as directed by your health care provider.  What should I know about cancer screening? There are several types of cancer. Take the following steps to reduce your risk and to catch any cancer development as early as possible. Breast Cancer  Practice breast self-awareness. ? This means understanding how your breasts normally appear and feel. ? It also means doing regular breast self-exams. Let your health care provider know about any changes, no matter how small.  If you are 40  or older, have a clinician do a breast exam (clinical breast exam or CBE) every year. Depending on your age, family history, and medical history, it may be recommended that you also have a yearly breast X-ray (mammogram).  If you  have a family history of breast cancer, talk with your health care provider about genetic screening.  If you are at high risk for breast cancer, talk with your health care provider about having an MRI and a mammogram every year.  Breast cancer (BRCA) gene test is recommended for women who have family members with BRCA-related cancers. Results of the assessment will determine the need for genetic counseling and BRCA1 and for BRCA2 testing. BRCA-related cancers include these types: ? Breast. This occurs in males or females. ? Ovarian. ? Tubal. This may also be called fallopian tube cancer. ? Cancer of the abdominal or pelvic lining (peritoneal cancer). ? Prostate. ? Pancreatic.  Cervical, Uterine, and Ovarian Cancer Your health care provider may recommend that you be screened regularly for cancer of the pelvic organs. These include your ovaries, uterus, and vagina. This screening involves a pelvic exam, which includes checking for microscopic changes to the surface of your cervix (Pap test).  For women ages 21-65, health care providers may recommend a pelvic exam and a Pap test every three years. For women ages 79-65, they may recommend the Pap test and pelvic exam, combined with testing for human papilloma virus (HPV), every five years. Some types of HPV increase your risk of cervical cancer. Testing for HPV may also be done on women of any age who have unclear Pap test results.  Other health care providers may not recommend any screening for nonpregnant women who are considered low risk for pelvic cancer and have no symptoms. Ask your health care provider if a screening pelvic exam is right for you.  If you have had past treatment for cervical cancer or a condition that could lead to cancer, you need Pap tests and screening for cancer for at least 20 years after your treatment. If Pap tests have been discontinued for you, your risk factors (such as having a new sexual partner) need to be  reassessed to determine if you should start having screenings again. Some women have medical problems that increase the chance of getting cervical cancer. In these cases, your health care provider may recommend that you have screening and Pap tests more often.  If you have a family history of uterine cancer or ovarian cancer, talk with your health care provider about genetic screening.  If you have vaginal bleeding after reaching menopause, tell your health care provider.  There are currently no reliable tests available to screen for ovarian cancer.  Lung Cancer Lung cancer screening is recommended for adults 69-62 years old who are at high risk for lung cancer because of a history of smoking. A yearly low-dose CT scan of the lungs is recommended if you:  Currently smoke.  Have a history of at least 30 pack-years of smoking and you currently smoke or have quit within the past 15 years. A pack-year is smoking an average of one pack of cigarettes per day for one year.  Yearly screening should:  Continue until it has been 15 years since you quit.  Stop if you develop a health problem that would prevent you from having lung cancer treatment.  Colorectal Cancer  This type of cancer can be detected and can often be prevented.  Routine colorectal cancer screening usually begins at  age 62 and continues through age 28.  If you have risk factors for colon cancer, your health care provider may recommend that you be screened at an earlier age.  If you have a family history of colorectal cancer, talk with your health care provider about genetic screening.  Your health care provider may also recommend using home test kits to check for hidden blood in your stool.  A small camera at the end of a tube can be used to examine your colon directly (sigmoidoscopy or colonoscopy). This is done to check for the earliest forms of colorectal cancer.  Direct examination of the colon should be repeated every  5-10 years until age 24. However, if early forms of precancerous polyps or small growths are found or if you have a family history or genetic risk for colorectal cancer, you may need to be screened more often.  Skin Cancer  Check your skin from head to toe regularly.  Monitor any moles. Be sure to tell your health care provider: ? About any new moles or changes in moles, especially if there is a change in a mole's shape or color. ? If you have a mole that is larger than the size of a pencil eraser.  If any of your family members has a history of skin cancer, especially at a [place] age, talk with your health care provider about genetic screening.  Always use sunscreen. Apply sunscreen liberally and repeatedly throughout the day.  Whenever you are outside, protect yourself by wearing long sleeves, pants, a wide-brimmed hat, and sunglasses.  What should I know about osteoporosis? Osteoporosis is a condition in which bone destruction happens more quickly than new bone creation. After menopause, you may be at an increased risk for osteoporosis. To help prevent osteoporosis or the bone fractures that can happen because of osteoporosis, the following is recommended:  If you are 70-110 years old, get at least 1,000 mg of calcium and at least 600 mg of vitamin D per day.  If you are older than age 25 but younger than age 44, get at least 1,200 mg of calcium and at least 600 mg of vitamin D per day.  If you are older than age 47, get at least 1,200 mg of calcium and at least 800 mg of vitamin D per day.  Smoking and excessive alcohol intake increase the risk of osteoporosis. Eat foods that are rich in calcium and vitamin D, and do weight-bearing exercises several times each week as directed by your health care provider. What should I know about how menopause affects my mental health? Depression may occur at any age, but it is more common as you become older. Common symptoms of depression  include:  Low or sad mood.  Changes in sleep patterns.  Changes in appetite or eating patterns.  Feeling an overall lack of motivation or enjoyment of activities that you previously enjoyed.  Frequent crying spells.  Talk with your health care provider if you think that you are experiencing depression. What should I know about immunizations? It is important that you get and maintain your immunizations. These include:  Tetanus, diphtheria, and pertussis (Tdap) booster vaccine.  Influenza every year before the flu season begins.  Pneumonia vaccine.  Shingles vaccine.  Your health care provider may also recommend other immunizations. This information is not intended to replace advice given to you by your health care provider. Make sure you discuss any questions you have with your health care provider. Document Released: 07/06/2005  Document Revised: 12/02/2015 Document Reviewed: 02/15/2015 Elsevier Interactive Patient Education  [employer].

## 2017-05-13 NOTE — Progress Notes (Signed)
Elson AreasJoyce P Rademaker 10/03/1955 [id number]    History:    Presents for annual exam.  Postmenopausal on no HRT with no bleeding. 10/2014 stroke from a fib did have an ablation and doing well. Normal Pap and mammogram history. [facility]. Had Zostavax. Is scheduled for D[facility] at [facility], history of osteopenia.   Past medical history, past surgical history, family history and social history were all reviewed and documented in the EPIC chart. Retired from [employer]. Actively volunteering with [employer]. 62 year old daughter [name] adopted currently at [employer] wanting to transfer to [employer] for architecture . Josh 23 graduated from [employer]. Both doing well.  ROS:  A ROS was performed and pertinent positives and negatives are included.  Exam:  Vitals:   05/13/17 1006  BP: 118/80  Weight: 120 lb (54.4 kg)  Height: 5\' 7"  (1.702 m)   Body mass index is 18.79 kg/m.   General appearance:  Normal Thyroid:  Symmetrical, normal in size, without palpable masses or nodularity. Respiratory  Auscultation:  Clear without wheezing or rhonchi Cardiovascular  Auscultation:  Regular rate, without rubs, murmurs or gallops  Edema/varicosities:  Not grossly evident Abdominal  Soft,nontender, without masses, guarding or rebound.  Liver/spleen:  No organomegaly noted  Hernia:  None appreciated  Skin  Inspection:  Grossly normal   Breasts: Examined lying and sitting.     Right: Without masses, retractions, discharge or axillary adenopathy.     Left: Without masses, retractions, discharge or axillary adenopathy. Gentitourinary   Inguinal/mons:  Normal without inguinal adenopathy  External genitalia:  Normal  BUS/Urethra/Skene's glands:  Normal  Vagina:  Normal  Cervix:  Normal  Uterus: normal in size, shape and contour.  Midline and mobile  Adnexa/parametria:     Rt: Without masses or tenderness.   Lt: Without masses or tenderness.  Anus and  perineum: Normal  Digital rectal exam: Normal sphincter tone without palpated masses or tenderness  Assessment/Plan:  61 y.o. MWF G1 P1 +1 adopted  for annual exam with no complaints.  Postmenopausal/no HRT/no bleeding 10/2014 stroke/A. fib/ablation-neurologist managing Depression stable on Wellbutrin per primary care Osteopenia-primary care managing  Plan: SBE's, continue annual screening mammogram, exercise, calcium rich diet, B complex vitamin daily encouraged. Home safety, fall prevention and importance of weightbearing exercise reviewed, has extremely healthy lifestyle of diet and exercise. Pap with HR HPV typing, new screening guidelines reviewed.    Harrington Challengerancy J Norvil Martensen [facility], 1:11 PM 05/13/2017

## 2017-05-14 NOTE — Progress Notes (Signed)
Clearance form fax to [facility] at [phone]  [phone]. Form fax twice, and receive.

## 2017-05-15 LAB — PAP, TP IMAGING W/ HPV RNA, RFLX HPV TYPE 16,18/45: HPV DNA High Risk: NOT DETECTED

## 2017-06-24 ENCOUNTER — Other Ambulatory Visit: Payer: Self-pay

## 2017-06-24 MED ORDER — ROSUVASTATIN CALCIUM 5 MG PO TABS: 2.5000 mg | ORAL_TABLET | Freq: Every day | ORAL | 3 refills | Status: DC

## 2017-06-24 MED ORDER — BUPROPION HCL ER (XL) 150 MG PO TB24: 150.0000 mg | ORAL_TABLET | Freq: Every day | ORAL | 3 refills | Status: DC

## 2017-07-08 ENCOUNTER — Other Ambulatory Visit: Payer: Self-pay

## 2017-07-08 MED ORDER — ROSUVASTATIN CALCIUM 5 MG PO TABS: 2.5000 mg | ORAL_TABLET | Freq: Every day | ORAL | 3 refills | Status: DC

## 2018-03-17 ENCOUNTER — Ambulatory Visit: Payer: BLUE CROSS/BLUE SHIELD | Admitting: Neurology

## 2018-03-19 ENCOUNTER — Ambulatory Visit (INDEPENDENT_AMBULATORY_CARE_PROVIDER_SITE_OTHER): Payer: BLUE CROSS/BLUE SHIELD | Admitting: Neurology

## 2018-03-19 ENCOUNTER — Encounter: Payer: Self-pay | Admitting: Neurology

## 2018-03-19 VITALS — BP 108/68 | HR 76 | Ht 67.0 in | Wt 128.0 lb

## 2018-03-19 DIAGNOSIS — I6990 Unspecified sequelae of unspecified cerebrovascular disease: Secondary | ICD-10-CM | POA: Diagnosis not present

## 2018-03-19 NOTE — Progress Notes (Signed)
[facility] [address] [place]. [place] [address] [phone]       OFFICE FOLLOW UP VISIT  NOTE  Martha. Martha Clark Date of Birth:  01-26-1956 Medical Record Number:  [id number]   Referring MD:  Birdie Sons Reason for Referral:  Stroke f/u  HPI:Martha Clark is a 62 year old Caucasian lady seen today for the first office follow-up visit following hospital admission for stroke in June 2016.Martha Clark is a 62 y.o. female with a past medical history significant for MVP s/p bovine mitral valve replacement, newly diagnosed atrial fibrillation s/p cardioversion in he ER on 6/21, osteoporosis, and asthma, brought in via EMS due to acute onset of left hemiparesis, dysarthria, and left facial weakness. Patient was home with family when she fell and called out for her family in the other room and noted patient to have left side paralysis with facial droop and slurred speech. EMS was immediately summoned and patient brought to the ED. Awake and alert at initial evaluation, with NIHSS 15 and CT scan showed no acute abnormality. She denied HA, vertigo, double vision, difficulty swallowing, or visual disturbances. No recent bleeding or surgeries. Patient not taking anticoagulants. NIHSS: 15. She was last known well 11/21/13 at 1655. She was administered IV tPA (50 mg on 11/20/2014 at 1800) followed by neurointervention with IA tPA and Solitaire device leading to TICI 3 revascularization. She was admitted to neuro ICU post procedure for further evaluation and treatement. She did well after the procedure and was extubated. She had mild left-sided weakness and dysmetria which in improve during her hospital stay. MRI scan of the brain showed patchy right basal ganglia infarct as well as smaller portions of frontal lobe. Transthoracic echo showed no cortex as well as embolism and tissue mitral valve was in good position. LDL cholesterol was 76 and hemoglobin A1c was 5.3. She was on aspirin  prior to admission given history of recent atrial fibrillation she was changed to eliquis. She did so well that she was discharged home with home physical and occupational therapy. She states she has continued to do well and physically has practically no deficits. Uneven she smiles she notices slight left facial asymmetry and some diminished fine motor skills in the left hand. She did feel a lot of fatigue and tiredness and felt like her mind was in a fog had trouble concentrating but after undergoing cardiac ablation for atrial flutter for the last week she feels a lot better like she is a different person. She's had a few transient episodes of blurred vision once while shopping in a grocery store when she felt lightheaded and woozy but only for a few seconds. On another occasion this happened when she was walking but felt better when she sat down. Her blood pressure has been running quite low in the 100 range and heart rate has been in the 50s. She has appointment to see Dr. Leonides Cave neuropsychologist later today for detailed testing. On Mini-Mental status testing in the office today she scored 29/30 with only one deficit in recall. She was able to name only 8 animals. She wants to go back to work but is willing to wait for neuropsych testing results to come back. Update 05/06/2015 : She returns for follow-up after last visit 3 months ago. She continues to do well and has not had any recurrent stroke or TIA symptoms. She is able to function well at her job but has noticed that her processing time needs increased. Her short-term memory  is also not as sharp. However she is compensated quite well and is productive at her job. She had a brief episode on 04/28/15 when she was bending down to reach something when she complained of vertigo. This lasted barely a few seconds present time she called her husband to help her she was okay. She denied a competing nausea, vomiting, blurred vision or headache. She has not been in  recurrent atrial flutter since successful catheter ablation. She remains on eliquis which is tolerating well without bleeding and only minor bruising. She is due for annual physical exam later this month and plans to have lipid profile checked at that time. Her blood pressure is doing well and today it is 124/75. She has seen her cardiologist Dr. Jacinto Halim who wonders if she can come off the eliquis. Update 12/29/2015 ; she returns for follow-up after last visit to 6 months ago. He continues to do well without recurrent stroke or TIA symptoms. The patient also continues to have some cognitive difficulties and finds work to be challenging. 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 She Struggles with Numbers and Remembering Recent Information. She's Also Noticed Trouble with Writing Though She Can Work Quite Well on the [employer]. She Plans to Have Right Foot Surgery Done for Bone-On-Bone and Has Questions about Stopping Her Anticoagulation and Stroke Risk. Update 02/20/2017 ; She returns for follow-up after last visit a year ago. She continues to do well from stroke standpoint without recurrent TIA or stroke symptoms. She is tolerating eliquis well without bruising or bleeding. She had lipid profile checked on 12/03/16 which shows total cholesterol 213, LDL 141 and HDL 64 mg percent. She is currently not on a statin as she is scared about possible side effects. She has occasional transient blurred vision and double vision particularly when she moves her head suddenly. She has seen optometrist a few times but does have an appointment to see her ophthalmologist the next month. She is been quite active she has joined a Theatre manager at [employer].. She reads a lot.  it. 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 She does continue to have mild short-term memory difficulties but these do not appear to be getting worse. She does  write things down. She is worried about Alzheimer's since her mother had it. The patient was on Wellbutrin for 20 years but discontinued it last year. Since then she's noticed that she gets emotional and cries a lot inappropriately. She is wondering if she needs to go back on it. Update 03/19/2018 : She returns for follow-up after last visit a year ago.  She has had no recurrent stroke or TIA symptoms since her original stroke in June 2016.  She is tolerating Eliquis well without bleeding and only minor bruising.  She remains on Crestor which she is tolerating now well without muscle aches and pains.  She states a lipid profile checked by primary physician was satisfactory.  She states her short-term memory difficulties and cognitive issues remain stable.  She is quite active and she is in fact states she is feeling more energetic now.  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  She does travel quite a bit too.  She had right foot surgery in January on the great toe and has done well from that.  She still gets occasional transient diplopia on eating sweets mainly.  She was seen  by Dr. Randon Goldsmith ophthalmologist who found unremarkable eye exam.  She is quite active but feels she has gained 5 pounds.  She has not had any follow-up carotid ultrasound done since her stroke.  She has no new complaints. ROS:   14 system review of systems is positive for  memory loss, double vision ,  and all other systems negative  PMH:  Past Medical History:  Diagnosis Date  . 1st degree AV block   . Asthma   . Atrial fibrillation (HCC)   . History of CVA (cerebrovascular accident) 11/20/14  . Mitral valve prolapse   . Stroke [facility])     Social History:  Social History   Socioeconomic History  . Marital status: Married    Spouse name: Not on file  . Number of children: Not on file  . Years of education: Not on file  . Highest education level: Not on file  Occupational History  . Not on file  Social  Needs  . Financial resource strain: Not on file  . Food insecurity:    Worry: Not on file    Inability: Not on file  . Transportation needs:    Medical: Not on file    Non-medical: Not on file  Tobacco Use  . Smoking status: Never Smoker  . Smokeless tobacco: Never Used  Substance and Sexual Activity  . Alcohol use: No  . Drug use: No  . Sexual activity: Not Currently    Birth control/protection: None  Lifestyle  . Physical activity:    Days per week: Not on file    Minutes per session: Not on file  . Stress: Not on file  Relationships  . Social connections:    Talks on phone: Not on file    Gets together: Not on file    Attends religious service: Not on file    Active member of club or organization: Not on file    Attends meetings of clubs or organizations: Not on file    Relationship status: Not on file  . Intimate partner violence:    Fear of current or ex partner: Not on file    Emotionally abused: Not on file    Physically abused: Not on file    Forced sexual activity: Not on file  Other Topics Concern  . Not on file  Social History Narrative  . Not on file    Medications:   Current Outpatient Medications on File Prior to Visit  Medication Sig Dispense Refill  . acetaminophen (TYLENOL) 325 MG tablet Take 1,300 mg by mouth every 6 (six) hours as needed for mild pain.    Marland Kitchen apixaban (ELIQUIS) 5 MG TABS tablet Take 1 tablet (5 mg total) by mouth 2 (two) times daily. 60 tablet 12  . apixaban (ELIQUIS) 5 MG TABS tablet     . buPROPion (WELLBUTRIN XL) 150 MG 24 hr tablet Take 1 tablet (150 mg total) by mouth daily. 90 tablet 3  . calcium citrate-vitamin D (CITRACAL+D) 315-200 MG-UNIT per tablet Take 1 tablet by mouth daily.      . Cholecalciferol (VITAMIN D) 1000 UNITS capsule Take 1,000 Units by mouth daily.      . Cholecalciferol (VITAMIN D3) 1000 units CAPS Take by mouth.    . Coenzyme Q10 (COQ-10) 100 MG CAPS Take 2 tablets by mouth daily.      . Cyanocobalamin  (VITAMIN B-12 PO) Take 1 tablet by mouth daily.    Marland Kitchen MAGNESIUM CHLORIDE PO Take 1 tablet by  mouth daily.      . Multiple Vitamin (MULTIVITAMIN WITH MINERALS) TABS tablet Take 1 tablet by mouth daily.    . rosuvastatin (CRESTOR) 5 MG tablet Take 0.5 tablets (2.5 mg total) by mouth daily. 90 tablet 3  . vitamin C (ASCORBIC ACID) 500 MG tablet Take by mouth.    . Vitamins/Minerals TABS Take by mouth.     No current facility-administered medications on file prior to visit.     Allergies:  No Known Allergies  Physical Exam General: well developed, well nourished middle-aged Caucasian lady, seated, in no evident distress Head: head normocephalic and atraumatic.   Neck: supple with no carotid or supraclavicular bruits Cardiovascular: regular rate and rhythm, no murmurs Musculoskeletal: no deformity Skin:  no rash/petichiae Vascular:  Normal pulses all extremities  Neurologic Exam Mental Status: Awake and fully alert. Oriented to place and time. Recent and remote memory intact. Attention span, concentration and fund of knowledge appropriate. Mood and affect appropriate. Mini-Mental status exam Not done. Recall 3/3. Animal naming test not done . Cranial Nerves: Fundoscopic exam . Pupils equal, briskly reactive to light. Extraocular movements full without nystagmus. Visual fields full to confrontation. Hearing intact. Mild left lower facial asymmetry when she smiles. Facial sensation intact. Face, tongue, palate moves normally and symmetrically.  Motor: Normal bulk and tone. Normal strength in all tested extremity muscles. Diminished fine finger movements on the left. Mild left grip weakness. Orbits right over left approximately. Sensory.: intact to touch , pinprick , position and vibratory sensation.  Coordination: Rapid alternating movements normal in all extremities. Finger-to-nose and heel-to-shin performed accurately bilaterally. Head shaking produces no nystagmus or vertigo. Fukuda stepping  test is positive with the patient moving his several steps off the base and rotating slightly. Hallpike maneuver was negative. Gait and Station: Arises from chair without difficulty. Stance is normal. Gait demonstrates normal stride length and balance . Able to heel, toe and tandem walk without difficulty.  Reflexes: 1+ and symmetric. Toes downgoing.       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 She has done remarkably well with excellent physical recovery but has mild cognitive complaints which appear to be stable PLAN: I  had a long discussion with the patient regarding her remote stroke as well as mild cognitive impairment both of which appear to be stable.  . Continue eliquis for stroke prevention and   Crestor 2.5 mg daily and  CoQ10   200 mg daily to aim for LDL cholesterol goal below 70 mg percent.Check f/u carotid ultrasound study for screening. I have encouraged her to continue participation in cognitively challenging activities like solving crossword puzzles, playing sudoku and bridge. She will return for follow-up in a year or call earlier if necessary .Greater than 50% time during this 25 minute visit was spent on counseling and coordination of care about her memory difficulties and stroke risk   Delia Heady, MD  Note: This document was prepared with digital dictation and possible smart phrase technology. Any transcriptional errors that result from this process are unintentional.

## 2018-03-19 NOTE — Patient Instructions (Signed)
I  had a long discussion with the patient regarding her remote stroke as well as mild cognitive impairment both of which appear to be stable.  . Continue eliquis for stroke prevention and   Crestor 2.5 mg daily and  CoQ10   200 mg daily to aim for LDL cholesterol goal below 70 mg percent.Check f/u carotid ultrasound study for screening. I have encouraged her to continue participation in cognitively challenging activities like solving crossword puzzles, playing sudoku and bridge. She will return for follow-up in a year or call earlier if necessary

## 2018-03-26 ENCOUNTER — Ambulatory Visit (HOSPITAL_COMMUNITY): Payer: BLUE CROSS/BLUE SHIELD

## 2018-03-27 ENCOUNTER — Ambulatory Visit (HOSPITAL_COMMUNITY)
Admission: RE | Admit: 2018-03-27 | Discharge: 2018-03-27 | Disposition: A | Discharge status: Home / Self Care | Payer: BLUE CROSS/BLUE SHIELD | Source: Ambulatory Visit | Attending: Neurology | Admitting: Neurology

## 2018-03-27 DIAGNOSIS — I6990 Unspecified sequelae of unspecified cerebrovascular disease: Secondary | ICD-10-CM | POA: Diagnosis present

## 2018-03-27 DIAGNOSIS — I6523 Occlusion and stenosis of bilateral carotid arteries: Secondary | ICD-10-CM | POA: Diagnosis not present

## 2018-03-27 NOTE — Progress Notes (Signed)
Bilateral carotid duplex completed. Preliminary results - 1% to 39% ICA stenosis. Vertebral artery flow is antegrade. [place] Yesmin Mutch,RVS  03/27/2018, 1:57 PM

## 2018-04-01 ENCOUNTER — Telehealth: Payer: Self-pay | Admitting: Neurology

## 2018-04-01 NOTE — Telephone Encounter (Signed)
Pt has called stating she has seen results from her study on My Chart but does not fully understand them,  Pt is asking for a call for a explanation of results.

## 2018-04-01 NOTE — Telephone Encounter (Signed)
Rn call patient that the arotid ultrasound study was normal without evidence of significant narrowing of the carotid arteries in the neck.Pt verbalized understanding

## 2018-04-01 NOTE — Telephone Encounter (Signed)
Left vm for patient to call back about carotid ultrasound results. ------ 

## 2018-04-01 NOTE — Telephone Encounter (Signed)
Micki Riley, MD  Hildred Alamin, RN        Kindly inform the patient that carotid ultrasound study was normal without evidence of significant narrowing of the carotid arteries in the neck.

## 2018-04-01 NOTE — Telephone Encounter (Signed)
Pt has returned calll to RN, she is asking for a call back for results

## 2018-05-14 ENCOUNTER — Ambulatory Visit (INDEPENDENT_AMBULATORY_CARE_PROVIDER_SITE_OTHER): Payer: BLUE CROSS/BLUE SHIELD | Admitting: Women's Health

## 2018-05-14 ENCOUNTER — Encounter: Payer: Self-pay | Admitting: Women's Health

## 2018-05-14 VITALS — BP 124/76 | Ht 67.0 in | Wt 123.0 lb

## 2018-05-14 DIAGNOSIS — Z01419 Encounter for gynecological examination (general) (routine) without abnormal findings: Secondary | ICD-10-CM | POA: Diagnosis not present

## 2018-05-14 NOTE — Progress Notes (Signed)
Martha Clark 01/18/1956 [id number]    History:    Presents for annual exam.  Postmenopausal on no HRT with no bleeding.  Normal Pap and mammogram history.  10/2014 CVA from A. fib, had an ablation, robotic heart surgery for valve replacement.  2018- colonoscopy.  Has had Zostavax.  Osteoporosis primary care managing and has DEXA scheduled in January.  Not sexually active, husband's health/ED.  Past medical history, past surgical history, family history and social history were all reviewed and documented in the EPIC chart.  Retired from [employer].  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  [place]acation[place] in [place] next [place] and then traveling to [place], daughter doing a semester abroad in [place].  ROS:  A ROS was performed and pertinent positives and negatives are included.  Exam:  Vitals:   05/14/18 1009  BP: 124/76  Weight: 123 lb (55.8 kg)  Height: 5\' 7"  (1.702 m)   Body mass index is 19.26 kg/m.   General appearance:  Normal Thyroid:  Symmetrical, normal in size, without palpable masses or nodularity. Respiratory  Auscultation:  Clear without wheezing or rhonchi Cardiovascular  Auscultation:  Regular rate, without rubs, murmurs or gallops  Edema/varicosities:  Not grossly evident Abdominal  Soft,nontender, without masses, guarding or rebound.  Liver/spleen:  No organomegaly noted  Hernia:  None appreciated  Skin  Inspection:  Grossly normal   Breasts: Examined lying and sitting.     Right: Without masses, retractions, discharge or axillary adenopathy.     Left: Without masses, retractions, discharge or axillary adenopathy. Gentitourinary   Inguinal/mons:  Normal without inguinal adenopathy  External genitalia:  Normal  BUS/Urethra/Skene's glands:  Normal  Vagina:  Normal  Cervix:  Normal  Uterus:  normal in size, shape and contour.  Midline and mobile  Adnexa/parametria:     Rt: Without masses or  tenderness.   Lt: Without masses or tenderness.  Anus and perineum: Normal  Digital rectal exam: Normal sphincter tone without palpated masses or tenderness  Assessment/Plan:  62 y.o. WF G1P1 +1 adopted for annual exam with no complaints.  Postmenopausal/no HRT/no bleeding 2016 CVA from A. fib with ablation/valve replacement doing well Primary care manages labs and meds Osteoporosis-primary care  Plan: Importance of weightbearing/balance type exercise reviewed and encouraged.  Has follow-up scheduled for repeat DEXA next month.  Home safety, fall prevention discussed. Has had Zostavax, Shingrex reviewed and encouraged.  Pneumovax at 64.  SBEs, continue annual 3D screening mammogram, calcium rich foods, vitamin D 2000 daily encouraged.  Pap normal 2018, new screening guidelines reviewed.    Harrington Challengerancy J Iviona Hole [facility], 11:37 AM 05/14/2018

## 2018-05-14 NOTE — Patient Instructions (Signed)
Health Maintenance for Postmenopausal Women Menopause is a normal process in which your reproductive ability comes to an end. This process happens gradually over a span of months to years, usually between the ages of 62 and 89. Menopause is complete when you have missed 12 consecutive menstrual periods. It is important to talk with your health care provider about some of the most common conditions that affect postmenopausal women, such as heart disease, cancer, and bone loss (osteoporosis). Adopting a healthy lifestyle and getting preventive care can help to promote your health and wellness. Those actions can also lower your chances of developing some of these common conditions. What should I know about menopause? During menopause, you may experience a number of symptoms, such as:  Moderate-to-severe hot flashes.  Night sweats.  Decrease in sex drive.  Mood swings.  Headaches.  Tiredness.  Irritability.  Memory problems.  Insomnia. Choosing to treat or not to treat menopausal changes is an individual decision that you make with your health care provider. What should I know about hormone replacement therapy and supplements? Hormone therapy products are effective for treating symptoms that are associated with menopause, such as hot flashes and night sweats. Hormone replacement carries certain risks, especially as you become older. If you are thinking about using estrogen or estrogen with progestin treatments, discuss the benefits and risks with your health care provider. What should I know about heart disease and stroke? Heart disease, heart attack, and stroke become more likely as you age. This may be due, in part, to the hormonal changes that your body experiences during menopause. These can affect how your body processes dietary fats, triglycerides, and cholesterol. Heart attack and stroke are both medical emergencies. There are many things that you can do to help prevent heart disease  and stroke:  Have your blood pressure checked at least every 1-2 years. High blood pressure causes heart disease and increases the risk of stroke.  If you are 79-72 years old, ask your health care provider if you should take aspirin to prevent a heart attack or a stroke.  Do not use any tobacco products, including cigarettes, chewing tobacco, or electronic cigarettes. If you need help quitting, ask your health care provider.  It is important to eat a healthy diet and maintain a healthy weight. ? Be sure to include plenty of vegetables, fruits, low-fat dairy products, and lean protein. ? Avoid eating foods that are high in solid fats, added sugars, or salt (sodium).  Get regular exercise. This is one of the most important things that you can do for your health. ? Try to exercise for at least 150 minutes each week. The type of exercise that you do should increase your heart rate and make you sweat. This is known as moderate-intensity exercise. ? Try to do strengthening exercises at least twice each week. Do these in addition to the moderate-intensity exercise.  Know your numbers.Ask your health care provider to check your cholesterol and your blood glucose. Continue to have your blood tested as directed by your health care provider.  What should I know about cancer screening? There are several types of cancer. Take the following steps to reduce your risk and to catch any cancer development as early as possible. Breast Cancer  Practice breast self-awareness. ? This means understanding how your breasts normally appear and feel. ? It also means doing regular breast self-exams. Let your health care provider know about any changes, no matter how small.  If you are 40 or  older, have a clinician do a breast exam (clinical breast exam or CBE) every year. Depending on your age, family history, and medical history, it may be recommended that you also have a yearly breast X-ray (mammogram).  If you  have a family history of breast cancer, talk with your health care provider about genetic screening.  If you are at high risk for breast cancer, talk with your health care provider about having an MRI and a mammogram every year.  Breast cancer (BRCA) gene test is recommended for women who have family members with BRCA-related cancers. Results of the assessment will determine the need for genetic counseling and BRCA1 and for BRCA2 testing. BRCA-related cancers include these types: ? Breast. This occurs in males or females. ? Ovarian. ? Tubal. This may also be called fallopian tube cancer. ? Cancer of the abdominal or pelvic lining (peritoneal cancer). ? Prostate. ? Pancreatic. Cervical, Uterine, and Ovarian Cancer Your health care provider may recommend that you be screened regularly for cancer of the pelvic organs. These include your ovaries, uterus, and vagina. This screening involves a pelvic exam, which includes checking for microscopic changes to the surface of your cervix (Pap test).  For women ages 21-65, health care providers may recommend a pelvic exam and a Pap test every three years. For women ages 39-65, they may recommend the Pap test and pelvic exam, combined with testing for human papilloma virus (HPV), every five years. Some types of HPV increase your risk of cervical cancer. Testing for HPV may also be done on women of any age who have unclear Pap test results.  Other health care providers may not recommend any screening for nonpregnant women who are considered low risk for pelvic cancer and have no symptoms. Ask your health care provider if a screening pelvic exam is right for you.  If you have had past treatment for cervical cancer or a condition that could lead to cancer, you need Pap tests and screening for cancer for at least 20 years after your treatment. If Pap tests have been discontinued for you, your risk factors (such as having a new sexual partner) need to be reassessed  to determine if you should start having screenings again. Some women have medical problems that increase the chance of getting cervical cancer. In these cases, your health care provider may recommend that you have screening and Pap tests more often.  If you have a family history of uterine cancer or ovarian cancer, talk with your health care provider about genetic screening.  If you have vaginal bleeding after reaching menopause, tell your health care provider.  There are currently no reliable tests available to screen for ovarian cancer. Lung Cancer Lung cancer screening is recommended for adults 57-50 years old who are at high risk for lung cancer because of a history of smoking. A yearly low-dose CT scan of the lungs is recommended if you:  Currently smoke.  Have a history of at least 30 pack-years of smoking and you currently smoke or have quit within the past 15 years. A pack-year is smoking an average of one pack of cigarettes per day for one year. Yearly screening should:  Continue until it has been 15 years since you quit.  Stop if you develop a health problem that would prevent you from having lung cancer treatment. Colorectal Cancer  This type of cancer can be detected and can often be prevented.  Routine colorectal cancer screening usually begins at age 12 and continues through  age 75.  If you have risk factors for colon cancer, your health care provider may recommend that you be screened at an earlier age.  If you have a family history of colorectal cancer, talk with your health care provider about genetic screening.  Your health care provider may also recommend using home test kits to check for hidden blood in your stool.  A small camera at the end of a tube can be used to examine your colon directly (sigmoidoscopy or colonoscopy). This is done to check for the earliest forms of colorectal cancer.  Direct examination of the colon should be repeated every 5-10 years until  age 75. However, if early forms of precancerous polyps or small growths are found or if you have a family history or genetic risk for colorectal cancer, you may need to be screened more often. Skin Cancer  Check your skin from head to toe regularly.  Monitor any moles. Be sure to tell your health care provider: ? About any new moles or changes in moles, especially if there is a change in a mole's shape or color. ? If you have a mole that is larger than the size of a pencil eraser.  If any of your family members has a history of skin cancer, especially at a Shamyra Farias age, talk with your health care provider about genetic screening.  Always use sunscreen. Apply sunscreen liberally and repeatedly throughout the day.  Whenever you are outside, protect yourself by wearing long sleeves, pants, a wide-brimmed hat, and sunglasses. What should I know about osteoporosis? Osteoporosis is a condition in which bone destruction happens more quickly than new bone creation. After menopause, you may be at an increased risk for osteoporosis. To help prevent osteoporosis or the bone fractures that can happen because of osteoporosis, the following is recommended:  If you are 19-50 years old, get at least 1,000 mg of calcium and at least 600 mg of vitamin D per day.  If you are older than age 50 but younger than age 70, get at least 1,200 mg of calcium and at least 600 mg of vitamin D per day.  If you are older than age 70, get at least 1,200 mg of calcium and at least 800 mg of vitamin D per day. Smoking and excessive alcohol intake increase the risk of osteoporosis. Eat foods that are rich in calcium and vitamin D, and do weight-bearing exercises several times each week as directed by your health care provider. What should I know about how menopause affects my mental health? Depression may occur at any age, but it is more common as you become older. Common symptoms of depression include:  Low or sad  mood.  Changes in sleep patterns.  Changes in appetite or eating patterns.  Feeling an overall lack of motivation or enjoyment of activities that you previously enjoyed.  Frequent crying spells. Talk with your health care provider if you think that you are experiencing depression. What should I know about immunizations? It is important that you get and maintain your immunizations. These include:  Tetanus, diphtheria, and pertussis (Tdap) booster vaccine.  Influenza every year before the flu season begins.  Pneumonia vaccine.  Shingles vaccine. Your health care provider may also recommend other immunizations. This information is not intended to replace advice given to you by your health care provider. Make sure you discuss any questions you have with your health care provider. Document Released: 07/06/2005 Document Revised: 12/02/2015 Document Reviewed: 02/15/2015 Elsevier Interactive Patient Education    2019 [place].

## 2018-08-08 ENCOUNTER — Other Ambulatory Visit: Payer: Self-pay | Admitting: Neurology

## 2018-08-11 ENCOUNTER — Telehealth: Payer: Self-pay | Admitting: *Deleted

## 2018-08-11 NOTE — Telephone Encounter (Signed)
Patient said she received your message about the fosamax. She asked if you will call her back today, she has a flight to catch at 4:30pm.

## 2018-08-12 NOTE — Telephone Encounter (Signed)
Message left

## 2018-08-12 NOTE — Telephone Encounter (Signed)
Phone call to review bone density from primary care per request, they had recommended starting Fosamax due to the osteoporosis and history of a fracture.  Reviewed bone density as results noting osteoporosis is well and has increased fracture risk, agreed with primary care to start Fosamax.  Continue healthy lifestyle of weightbearing and balance type exercise, home safety, fall prevention discussed.  We will follow-up with primary care.

## 2018-08-20 ENCOUNTER — Telehealth: Payer: Self-pay | Admitting: Neurology

## 2018-08-20 NOTE — Telephone Encounter (Signed)
Pt called wanting to know the update on the conference call that is to take place on May 1st with another stroke pt. Please advise.

## 2018-08-21 NOTE — Telephone Encounter (Signed)
I called patient about a conference call with another patient on Sep 26 2018. PT stated Dr. Pearlean Brownie mention it to her at last visit. It was suppose to consist of another pt that had a stroke like she had. I stated a message will be sent to Dr. Pearlean Brownie. Pt verbalized understanding.

## 2018-08-21 NOTE — Telephone Encounter (Signed)
Stroke conference postponed likely till oct

## 2018-09-30 ENCOUNTER — Encounter: Payer: Self-pay | Admitting: Cardiology

## 2018-10-01 ENCOUNTER — Other Ambulatory Visit: Payer: Self-pay

## 2018-10-01 ENCOUNTER — Encounter: Payer: Self-pay | Admitting: Cardiology

## 2018-10-01 ENCOUNTER — Ambulatory Visit: Payer: BC Managed Care – PPO | Admitting: Cardiology

## 2018-10-01 DIAGNOSIS — Z8679 Personal history of other diseases of the circulatory system: Secondary | ICD-10-CM

## 2018-10-01 DIAGNOSIS — Z9889 Other specified postprocedural states: Secondary | ICD-10-CM

## 2018-10-01 DIAGNOSIS — Z09 Encounter for follow-up examination after completed treatment for conditions other than malignant neoplasm: Secondary | ICD-10-CM

## 2018-10-01 DIAGNOSIS — Z952 Presence of prosthetic heart valve: Secondary | ICD-10-CM | POA: Insufficient documentation

## 2018-10-01 DIAGNOSIS — Z8673 Personal history of transient ischemic attack (TIA), and cerebral infarction without residual deficits: Secondary | ICD-10-CM

## 2018-10-01 HISTORY — DX: Other specified postprocedural states: Z98.890

## 2018-10-01 HISTORY — DX: Personal history of other diseases of the circulatory system: Z86.79

## 2018-10-01 MED ORDER — ROSUVASTATIN CALCIUM 5 MG PO TABS: 5.0000 mg | ORAL_TABLET | Freq: Every day | ORAL | 3 refills | Status: DC

## 2018-10-01 NOTE — Progress Notes (Addendum)
Primary Physician/Referring:  Deland Pretty, MD  Patient ID: Martha Clark, female    DOB: 1955-09-30, 63 y.o.   MRN: [id number]  Chief complaint: S/p MVR   HPI:   Martha Clark  is a 63 y.o. female  Bioprosthetic mitral valve replacement 2008 (Dr Graylon Gunning [place]), atrial flutter s/p ablation 2016, h/o CVA.   She has been doing well and denies chest pain, shortness of breath, palpitations, leg edema, orthopnea, PND, TIA/syncope. She has not had an echocardiogram since 2016.  Past Medical History:  Diagnosis Date  . 1st degree AV block   . Asthma   . Atrial fibrillation ([place])   . History of CVA (cerebrovascular accident) 11/20/14  . Mitral valve prolapse   . Stroke [facility])     Past Surgical History:  Procedure Laterality Date  . ELECTROPHYSIOLOGIC STUDY N/A 01/17/2015   Procedure: A-Flutter Ablation;  Surgeon: Evans Lance, MD;  Location: [facility];  Service: Cardiovascular;  Laterality: N/A;  . MITRAL VALVE REPLACEMENT  2008  . RADIOLOGY WITH ANESTHESIA N/A 11/20/2014   Procedure: RADIOLOGY WITH ANESTHESIA;  Surgeon: Luanne Bras, MD;  Location: [place];  Service: Radiology;  Laterality: N/A;  . TOE SURGERY    . TUBAL LIGATION      Social History   Socioeconomic History  . Marital status: Married    Spouse name: Not on file  . Number of children: 2  . Years of education: Not on file  . Highest education level: Not on file  Occupational History  . Not on file  Social Needs  . Financial resource strain: Not on file  . Food insecurity:    Worry: Not on file    Inability: Not on file  . Transportation needs:    Medical: Not on file    Non-medical: Not on file  Tobacco Use  . Smoking status: Former Research scientist (life sciences)  . Smokeless tobacco: Never Used  Substance and Sexual Activity  . Alcohol use: No  . Drug use: No  . Sexual activity: Not Currently  Lifestyle  . Physical activity:    Days per week: Not on file    Minutes per session: Not on file   . Stress: Not on file  Relationships  . Social connections:    Talks on phone: Not on file    Gets together: Not on file    Attends religious service: Not on file    Active member of club or organization: Not on file    Attends meetings of clubs or organizations: Not on file    Relationship status: Not on file  . Intimate partner violence:    Fear of current or ex partner: Not on file    Emotionally abused: Not on file    Physically abused: Not on file    Forced sexual activity: Not on file  Other Topics Concern  . Not on file  Social History Narrative  . Not on file    Current Outpatient Medications on File Prior to Visit  Medication Sig Dispense Refill  . apixaban (ELIQUIS) 5 MG TABS tablet Take 1 tablet (5 mg total) by mouth 2 (two) times daily. 60 tablet 12  . buPROPion (WELLBUTRIN XL) 150 MG 24 hr tablet Take 1 tablet (150 mg total) by mouth daily. 90 tablet 3  . calcium citrate-vitamin D (CITRACAL+D) 315-200 MG-UNIT per tablet Take 1 tablet by mouth daily.      . Cholecalciferol (VITAMIN D) 1000 UNITS capsule Take 1,000 Units  by mouth daily.      . Coenzyme Q10 (COQ-10) 100 MG CAPS Take 1 tablet by mouth daily.      . Cyanocobalamin (VITAMIN B-12 PO) Take 1 tablet by mouth daily.    Marland Kitchen MAGNESIUM CHLORIDE PO Take 1 tablet by mouth daily.      . Multiple Vitamin (MULTIVITAMIN WITH MINERALS) TABS tablet Take 1 tablet by mouth daily.    . rosuvastatin (CRESTOR) 5 MG tablet TAKE ONE-HALF TABLET BY MOUTH DAILY 45 tablet 1  . vitamin C (ASCORBIC ACID) 500 MG tablet Take by mouth.     No current facility-administered medications on file prior to visit.     Review of Systems  Constitution: Negative for chills, decreased appetite, malaise/fatigue and weight gain.  Cardiovascular: Negative for dyspnea on exertion, leg swelling and syncope.  Endocrine: Negative for cold intolerance.  Hematologic/Lymphatic: Does not bruise/bleed easily.  Musculoskeletal: Negative for joint swelling.   Gastrointestinal: Negative for abdominal pain, anorexia and change in bowel habit.  Neurological: Negative for headaches and light-headedness.  Psychiatric/Behavioral: Negative for depression and substance abuse.  All other systems reviewed and are negative.     Objective   No vitals performed today.  Physical Exam  Constitutional: She is oriented to person, place, and time. She appears well-developed and well-nourished. No distress.  Pulmonary/Chest: Effort normal.  Neurological: She is alert and oriented to person, place, and time.  Psychiatric: She has a normal mood and affect.  Nursing note and vitals reviewed.    Laboratory examination:   12/04/2017: RBC 5.5, hemoglobin 15.5, hematocrit 47, CBC otherwise normal.  Creatinine 0.9, EGFR 83/69, potassium 4.5, CMP normal.  Cholesterol 186, triglycerides 41, HDL 66, LDL 112.  TSH 1.83.  Cardiac Studies:   Echocardiogram  11/21/2014: Normal LV systolic function. EF 55-60%. Tissue mitral valve noted. Severely calcified annulus. Normal thickness of the leaflets. Mild regurgitation left atrium was moderately dilated.   Assessment   63 y.o. Caucasian female  S/p bioprosthetic mitral valve replacement 2008 (Dr Graylon Gunning [place]), atrial flutter s/p ablation 2016, h/o CVA.   1. S/P MVR (mitral valve replacement)   Clinically, doing well. Will obtain echocardiogram in 02/2019. Follow up with Dr. Einar Gip in 02/2019.  2. S/P ablation of atrial flutter  Clinically, no recurrence. She has been maintained on eliquis 5 mg bid, given low bleeding risk and moderate stroke risk(CHA2DS2VAsc score 3, annual stroke risk 3%)  3. H/o stroke: Although stroke was likely cardioembolic, LDL is elevated. was Continue statin, reocommend crestor 5 mg daily.   Nigel Mormon, MD [facility] Pager: [phone] Office: [phone] If no answer Cell [phone]

## 2018-11-18 ENCOUNTER — Telehealth: Payer: Self-pay

## 2018-11-18 NOTE — Telephone Encounter (Signed)
Pt called she says she was considering going to [place] for a wedding and she has some concerns about her valve and the virus and if you think its a good idea if she just uses the normal precautions

## 2018-11-18 NOTE — Telephone Encounter (Signed)
Yes, social distancing is so important and I do not know if this can be achieved unless the wedding is outside and spaced out. A face mask and also face shield importantly will give her max protection along with usual hand hygeine. She can make face shield herself

## 2019-01-13 ENCOUNTER — Other Ambulatory Visit: Payer: Self-pay

## 2019-01-13 ENCOUNTER — Ambulatory Visit (INDEPENDENT_AMBULATORY_CARE_PROVIDER_SITE_OTHER): Payer: BC Managed Care – PPO

## 2019-01-13 DIAGNOSIS — Z0189 Encounter for other specified special examinations: Secondary | ICD-10-CM

## 2019-01-13 DIAGNOSIS — Z952 Presence of prosthetic heart valve: Secondary | ICD-10-CM

## 2019-01-20 ENCOUNTER — Other Ambulatory Visit: Payer: Self-pay | Admitting: Neurology

## 2019-01-21 ENCOUNTER — Other Ambulatory Visit: Payer: Self-pay

## 2019-01-21 MED ORDER — ROSUVASTATIN CALCIUM 5 MG PO TABS: 5.0000 mg | ORAL_TABLET | Freq: Every day | ORAL | 0 refills | Status: AC

## 2019-01-26 NOTE — Telephone Encounter (Signed)
From pt

## 2019-03-02 ENCOUNTER — Encounter: Payer: Self-pay | Admitting: Cardiology

## 2019-03-02 ENCOUNTER — Other Ambulatory Visit: Payer: Self-pay

## 2019-03-02 ENCOUNTER — Ambulatory Visit: Payer: BLUE CROSS/BLUE SHIELD | Admitting: Cardiology

## 2019-03-02 ENCOUNTER — Ambulatory Visit (INDEPENDENT_AMBULATORY_CARE_PROVIDER_SITE_OTHER): Payer: BC Managed Care – PPO | Admitting: Cardiology

## 2019-03-02 VITALS — BP 103/68 | HR 76 | Ht 66.0 in | Wt 120.4 lb

## 2019-03-02 DIAGNOSIS — Z8673 Personal history of transient ischemic attack (TIA), and cerebral infarction without residual deficits: Secondary | ICD-10-CM | POA: Diagnosis not present

## 2019-03-02 DIAGNOSIS — I453 Trifascicular block: Secondary | ICD-10-CM | POA: Diagnosis not present

## 2019-03-02 DIAGNOSIS — Z9889 Other specified postprocedural states: Secondary | ICD-10-CM | POA: Diagnosis not present

## 2019-03-02 DIAGNOSIS — Z8679 Personal history of other diseases of the circulatory system: Secondary | ICD-10-CM

## 2019-03-02 DIAGNOSIS — Z298 Encounter for other specified prophylactic measures: Secondary | ICD-10-CM

## 2019-03-02 DIAGNOSIS — Z952 Presence of prosthetic heart valve: Secondary | ICD-10-CM | POA: Diagnosis not present

## 2019-03-02 NOTE — Progress Notes (Signed)
Primary Physician/Referring:  Deland Pretty, MD  Patient ID: Martha Clark, female    DOB: 11-12-1955, 63 y.o.   MRN: [id number]  Chief Complaint  Patient presents with  . Atrial Flutter  . Mitral Valve Prolapse  . Follow-up   HPI:    Martha Clark  is a 63 y.o.  Caucasian female  Bioprosthetic mitral valve replacement 2008 (Dr Graylon Gunning [place]), atrial flutter s/p ablation 2016, h/o CVA.   She has been doing well and denies chest pain, shortness of breath, palpitations, leg edema, orthopnea, PND, TIA/syncope. She has not had an echocardiogram since 2016.  Past Medical History:  Diagnosis Date  . 1st degree AV block   . Asthma   . Atrial fibrillation ([place])   . History of CVA (cerebrovascular accident) 11/20/14  . Mitral valve prolapse   . Osteopetrosis   . Stroke [facility])    Past Surgical History:  Procedure Laterality Date  . ELECTROPHYSIOLOGIC STUDY N/A 01/17/2015   Procedure: A-Flutter Ablation;  Surgeon: Evans Lance, MD;  Location: [facility];  Service: Cardiovascular;  Laterality: N/A;  . MITRAL VALVE REPLACEMENT  2008  . RADIOLOGY WITH ANESTHESIA N/A 11/20/2014   Procedure: RADIOLOGY WITH ANESTHESIA;  Surgeon: Luanne Bras, MD;  Location: [facility];  Service: Radiology;  Laterality: N/A;  . TOE SURGERY    . TUBAL LIGATION     Social History   Socioeconomic History  . Marital status: Married    Spouse name: Not on file  . Number of children: 2  . Years of education: Not on file  . Highest education level: Not on file  Occupational History  . Not on file  Social Needs  . Financial resource strain: Not on file  . Food insecurity    Worry: Not on file    Inability: Not on file  . Transportation needs    Medical: Not on file    Non-medical: Not on file  Tobacco Use  . Smoking status: Former Research scientist (life sciences)  . Smokeless tobacco: Never Used  . Tobacco comment: smoked in college   Substance and Sexual Activity  . Alcohol use: No  . Drug use: No   . Sexual activity: Not Currently  Lifestyle  . Physical activity    Days per week: Not on file    Minutes per session: Not on file  . Stress: Not on file  Relationships  . Social Herbalist on phone: Not on file    Gets together: Not on file    Attends religious service: Not on file    Active member of club or organization: Not on file    Attends meetings of clubs or organizations: Not on file    Relationship status: Not on file  . Intimate partner violence    Fear of current or ex partner: Not on file    Emotionally abused: Not on file    Physically abused: Not on file    Forced sexual activity: Not on file  Other Topics Concern  . Not on file  Social History Narrative  . Not on file   ROS  Review of Systems  Constitution: Negative for chills, decreased appetite, malaise/fatigue and weight gain.  Cardiovascular: Positive for dyspnea on exertion (mild and stable). Negative for leg swelling and syncope.  Endocrine: Negative for cold intolerance.  Hematologic/Lymphatic: Does not bruise/bleed easily.  Musculoskeletal: Negative for joint swelling.  Gastrointestinal: Negative for abdominal pain, anorexia, change in bowel habit, hematochezia and  melena.  Neurological: Negative for headaches and light-headedness.  Psychiatric/Behavioral: Negative for depression and substance abuse.  All other systems reviewed and are negative.  Objective   Vitals with BMI 03/02/2019 05/14/2018 03/19/2018  Height 5' 6"  5' 7"  5' 7"   Weight 120 lbs 6 oz 123 lbs 128 lbs  BMI 19.44 48.54 62.70  Systolic 350 093 818  Diastolic 68 76 68  Pulse 76 - 76    Blood pressure 103/68, pulse 76, height 5' 6"  (1.676 m), weight 120 lb 6.4 oz (54.6 kg), SpO2 97 %. Body mass index is 19.43 kg/m.   Physical Exam  Constitutional: She appears well-developed and well-nourished. No distress.  HENT:  Head: Atraumatic.  Eyes: Conjunctivae are normal.  Neck: Neck supple. No JVD present. No thyromegaly  present.  Cardiovascular: Normal rate, regular rhythm, S1 normal, S2 normal and intact distal pulses. Exam reveals no gallop.  Murmur heard.  Midsystolic murmur is present with a grade of 1/6 at the upper right sternal border. Right superficial varicose veins noted. No edema. No JVD  Pulmonary/Chest: Effort normal and breath sounds normal.  Abdominal: Soft. Bowel sounds are normal.  Musculoskeletal: Normal range of motion.        General: No edema.  Neurological: She is alert.  Skin: Skin is warm and dry.  Psychiatric: She has a normal mood and affect.   Radiology: No results found.  Laboratory examination:   Labs 02/16/2019: Total cholesterol 155, triglycerides 49, HDL 64, LDL 80.  CMP normal.   12/04/2017: RBC 5.5, hemoglobin 15.5, hematocrit 47, CBC otherwise normal.  Creatinine 0.9, EGFR 83/69, potassium 4.5, CMP normal.  Cholesterol 186, triglycerides 41, HDL 66, LDL 112.  TSH 1.83.   No results for input(s): NA, K, CL, CO2, GLUCOSE, BUN, CREATININE, CALCIUM, GFRNONAA, GFRAA in the last 8760 hours. CMP Latest Ref Rng & Units 01/10/2015 11/23/2014 11/22/2014  Glucose 70 - 99 mg/dL 102(H) 102(H) 101(H)  BUN 6 - 23 mg/dL 21 <5(L) <5(L)  Creatinine 0.40 - 1.20 mg/dL 0.86 0.56 0.59  Sodium 135 - 145 mEq/L 142 138 141  Potassium 3.5 - 5.1 mEq/L 4.6 4.2 3.6  Chloride 96 - 112 mEq/L 105 107 110  CO2 19 - 32 mEq/L 32 28 25  Calcium 8.4 - 10.5 mg/dL 9.4 8.0(L) 8.3(L)  Total Protein 6.5 - 8.1 g/dL - - -  Total Bilirubin 0.3 - 1.2 mg/dL - - -  Alkaline Phos 38 - 126 U/L - - -  AST 15 - 41 U/L - - -  ALT 14 - 54 U/L - - -   CBC Latest Ref Rng & Units 01/10/2015 11/23/2014 11/22/2014  WBC 4.0 - 10.5 K/uL 7.0 6.8 7.5  Hemoglobin 12.0 - 15.0 g/dL 15.9(H) 13.0 12.9  Hematocrit 36.0 - 46.0 % 47.5(H) 39.5 39.2  Platelets 150.0 - 400.0 K/uL 168.0 178 173   Lipid Panel     Component Value Date/Time   CHOL 127 11/21/2014 0615   TRIG 65 11/21/2014 0615   HDL 38 (L) 11/21/2014 0615   CHOLHDL  3.3 11/21/2014 0615   VLDL 13 11/21/2014 0615   LDLCALC 76 11/21/2014 0615   HEMOGLOBIN A1C Lab Results  Component Value Date   HGBA1C 5.3 11/21/2014   MPG 105 11/21/2014   TSH No results for input(s): TSH in the last 8760 hours. Medications and allergies  No Known Allergies   Prior to Admission medications   Medication Sig Start Date End Date Taking? Authorizing Provider  alendronate (FOSAMAX) 70 MG tablet  Take 70 mg by mouth once a week. Take with a full glass of water on an empty stomach.    [provider]  apixaban (ELIQUIS) 5 MG TABS tablet Take 1 tablet (5 mg total) by mouth 2 (two) times daily. 11/24/14   Donzetta Starch, NP  buPROPion (WELLBUTRIN XL) 150 MG 24 hr tablet Take 150 mg by mouth daily.    [provider]  calcium citrate-vitamin D (CITRACAL+D) 315-200 MG-UNIT per tablet Take 1 tablet by mouth daily.      [provider]  Cholecalciferol (VITAMIN D) 1000 UNITS capsule Take 1,000 Units by mouth daily.      [provider]  Coenzyme Q10 (COQ-10) 100 MG CAPS Take 1 tablet by mouth daily.      [provider]  Cyanocobalamin (VITAMIN B-12 PO) Take 1 tablet by mouth daily.    [provider]  MAGNESIUM CHLORIDE PO Take 1 tablet by mouth daily.      [provider]  Multiple Vitamin (MULTIVITAMIN WITH MINERALS) TABS tablet Take 1 tablet by mouth daily.    [provider]  rosuvastatin (CRESTOR) 5 MG tablet Take 1 tablet (5 mg total) by mouth at bedtime. 01/21/19   Garvin Fila, MD  vitamin C (ASCORBIC ACID) 500 MG tablet Take by mouth.    [provider]     Current Outpatient Medications  Medication Instructions  . alendronate (FOSAMAX) 70 mg, Oral, Weekly, Take with a full glass of water on an empty stomach.   Marland Kitchen apixaban (ELIQUIS) 5 mg, Oral, 2 times daily  . buPROPion (WELLBUTRIN XL) 150 mg, Oral, Daily  . calcium citrate-vitamin D (CITRACAL+D) 315-200 MG-UNIT per tablet 1 tablet,  Daily  . Coenzyme Q10 (COQ-10) 100 MG CAPS 1 tablet, Oral, Daily,    . Cyanocobalamin (VITAMIN B-12 PO) 1 tablet, Oral, Daily  . MAGNESIUM CHLORIDE PO 1 tablet, Daily  . MAGNESIUM PO Oral, Daily  . Multiple Vitamin (MULTIVITAMIN WITH MINERALS) TABS tablet 1 tablet, Oral, Daily  . rosuvastatin (CRESTOR) 5 mg, Oral, Daily at bedtime  . vitamin C (ASCORBIC ACID) 500 MG tablet Oral  . Vitamin D 1,000 Units, Daily    Cardiac Studies:   Echocardiogram 01/13/2019: Left ventricle cavity is normal in size. Mild concentric hypertrophy of the left ventricle. Normal LV systolic function with visual EF 50-55%. Abnormal septal wall motion due to post-operative valve. Diastolic function not assessed due to post-op valve status. Bioprosthetic mitral valve with normal functioning. Mean PG 4 mmHg at 75 bpm. Moderate (Grade II) mitral regurgitation. Mild to moderate tricuspid regurgitation. No evidence of pulmonary hypertension. No significant change compared to previous study in 2014.   Carotid artery duplex 03/27/2018: Right Carotid: Velocities in the right ICA are consistent with a 1-39% stenosis. Left Carotid: Velocities in the left ICA are consistent with a 1-39% stenosis. Vertebrals:  Bilateral vertebral arteries demonstrate antegrade flow. Subclavians: Normal flow hemodynamics were seen in bilateral subclavian  arteries.  Assessment     ICD-10-CM   1. S/P MVR (mitral valve replacement)  Z95.2 EKG 12-Lead   Robot-assisted ([id number]). 01/28/2007 MV replacement 01/2007: Dr. Lewayne B[facility] at [facility] with bioprosthetic MV.  2. S/P ablation of atrial flutter 01/17/15  Z98.890    Z86.79   3. History of CVA (cerebrovascular accident) 11/20/14  Z86.73   4. 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 Indication present for endocarditis prophylaxis  Z29.8     EKG 03/02/2019: Sinus rhythm with first-degree AV block at the rate  of 77 bpm, LAE, left axis deviation, left anterior physical block.   right bundle branch block.  Trifascicular block.  Poor R-wave progression, cannot exclude anteroseptal infarct old.  Probably normal variant.  Normal QT interval. No significant change from  09/26/2017.  Recommendations:   Patient is here on annual visit and follow-up of bioprosthetic mitral valve repair, trifascicular block, history of atrial flutter ablation.  In view of cardioembolic risk being high, she has been continued on Eliquis for anticoagulation as per EP evaluation.  She remains asymptomatic except for mild chronic dyspnea.  There is no clinical evidence of heart failure.  Although she has moderate mitral and tricuspid regurgitation, physical examination does not reveal any significant murmur.  I will see her back on an annual basis.  Her lipids are improved since increasing Crestor to 5 mg daily.  With regard to trifascicular block, this is chronic and asymptomatic.  Continue observation. Patient is aware of endocarditis prophylaxis.  Adrian Prows, MD, [facility] 03/02/2019, 9:49 AM [facility] [place] Pager: [phone] Office: [phone]  If no answer Cell [phone]

## 2019-03-30 ENCOUNTER — Ambulatory Visit: Payer: BLUE CROSS/BLUE SHIELD | Admitting: Neurology

## 2019-04-13 ENCOUNTER — Encounter: Payer: Self-pay | Admitting: Neurology

## 2019-04-13 ENCOUNTER — Ambulatory Visit (INDEPENDENT_AMBULATORY_CARE_PROVIDER_SITE_OTHER): Payer: BC Managed Care – PPO | Admitting: Neurology

## 2019-04-13 ENCOUNTER — Other Ambulatory Visit: Payer: Self-pay

## 2019-04-13 VITALS — BP 112/75 | HR 76 | Temp 97.4°F | Wt 121.8 lb

## 2019-04-13 DIAGNOSIS — G3184 Mild cognitive impairment, so stated: Secondary | ICD-10-CM

## 2019-04-13 NOTE — Progress Notes (Signed)
[facility] [address] [place] [address] [phone]       OFFICE FOLLOW UP VISIT  NOTE  Ms. MARLAYNA BANNISTER Date of Birth:  1955/07/14 Medical Record Number:  [id number]   Referring MD:  Phoebe Sharps Reason for Referral:  Stroke f/u  HPI:Ms Kinder is a 63 year old Caucasian lady seen today for the first office follow-up visit following hospital admission for stroke in June 2016.MCKELL RIECKE is a 63 y.o. female with a past medical history significant for MVP s/p bovine mitral valve replacement, newly diagnosed atrial fibrillation s/p cardioversion in he ER on 6/21, osteoporosis, and asthma, brought in via EMS due to acute onset of left hemiparesis, dysarthria, and left facial weakness. Patient was home with family when she fell and called out for her family in the other room and noted patient to have left side paralysis with facial droop and slurred speech. EMS was immediately summoned and patient brought to the ED. Awake and alert at initial evaluation, with NIHSS 15 and CT scan showed no acute abnormality. She denied HA, vertigo, double vision, difficulty swallowing, or visual disturbances. No recent bleeding or surgeries. Patient not taking anticoagulants. NIHSS: 15. She was last known well 11/21/13 at 1655. She was administered IV tPA (50 mg on 11/20/2014 at 1800) followed by neurointervention with IA tPA and Solitaire device leading to TICI 3 revascularization. She was admitted to neuro ICU post procedure for further evaluation and treatement. She did well after the procedure and was extubated. She had mild left-sided weakness and dysmetria which in improve during her hospital stay. MRI scan of the brain showed patchy right basal ganglia infarct as well as smaller portions of frontal lobe. Transthoracic echo showed no cortex as well as embolism and tissue mitral valve was in good position. LDL cholesterol was 76 and hemoglobin A1c was 5.3. She was on aspirin  prior to admission given history of recent atrial fibrillation she was changed to eliquis. She did so well that she was discharged home with home physical and occupational therapy. She states she has continued to do well and physically has practically no deficits. Uneven she smiles she notices slight left facial asymmetry and some diminished fine motor skills in the left hand. She did feel a lot of fatigue and tiredness and felt like her mind was in a fog had trouble concentrating but after undergoing cardiac ablation for atrial flutter for the last week she feels a lot better like she is a different person. She's had a few transient episodes of blurred vision once while shopping in a grocery store when she felt lightheaded and woozy but only for a few seconds. On another occasion this happened when she was walking but felt better when she sat down. Her blood pressure has been running quite low in the 100 range and heart rate has been in the 50s. She has appointment to see Dr. Valentina Shaggy neuropsychologist later today for detailed testing. On Mini-Mental status testing in the office today she scored 29/30 with only one deficit in recall. She was able to name only 8 animals. She wants to go back to work but is willing to wait for neuropsych testing results to come back. Update 05/06/2015 : She returns for follow-up after last visit 3 months ago. She continues to do well and has not had any recurrent stroke or TIA symptoms. She is able to function well at her job but has noticed that her processing time needs increased. Her short-term memory  is also not as sharp. However she is compensated quite well and is productive at her job. She had a brief episode on 04/28/15 when she was bending down to reach something when she complained of vertigo. This lasted barely a few seconds present time she called her husband to help her she was okay. She denied a competing nausea, vomiting, blurred vision or headache. She has not been in  recurrent atrial flutter since successful catheter ablation. She remains on eliquis which is tolerating well without bleeding and only minor bruising. She is due for annual physical exam later this month and plans to have lipid profile checked at that time. Her blood pressure is doing well and today it is 124/75. She has seen her cardiologist Dr. Jacinto Halim who wonders if she can come off the eliquis. Update 12/29/2015 ; she returns for follow-up after last visit to 6 months ago. He continues to do well without recurrent stroke or TIA symptoms. The patient also continues to have some cognitive difficulties and finds work to be challenging. 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 She Struggles with Numbers and Remembering Recent Information. She's Also Noticed Trouble with Writing Though She Can Work Quite Well on the [employer]. She Plans to Have Right Foot Surgery Done for Bone-On-Bone and Has Questions about Stopping Her Anticoagulation and Stroke Risk. Update 02/20/2017 ; She returns for follow-up after last visit a year ago. She continues to do well from stroke standpoint without recurrent TIA or stroke symptoms. She is tolerating eliquis well without bruising or bleeding. She had lipid profile checked on 12/03/16 which shows total cholesterol 213, LDL 141 and HDL 64 mg percent. She is currently not on a statin as she is scared about possible side effects. She has occasional transient blurred vision and double vision particularly when she moves her head suddenly. She has seen optometrist a few times but does have an appointment to see her ophthalmologist the next month. She is been quite active she has joined a Theatre manager at [employer].. She reads a lot.  it. 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 She does continue to have mild short-term memory difficulties but these do not appear to be getting worse. She does  write things down. She is worried about Alzheimer's since her mother had it. The patient was on Wellbutrin for 20 years but discontinued it last year. Since then she's noticed that she gets emotional and cries a lot inappropriately. She is wondering if she needs to go back on it. Update 03/19/2018 : She returns for follow-up after last visit a year ago.  She has had no recurrent stroke or TIA symptoms since her original stroke in June 2016.  She is tolerating Eliquis well without bleeding and only minor bruising.  She remains on Crestor which she is tolerating now well without muscle aches and pains.  She states a lipid profile checked by primary physician was satisfactory.  She states her short-term memory difficulties and cognitive issues remain stable.  She is quite active and she is in fact states she is feeling more energetic now.  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  She does travel quite a bit too.  She had right foot surgery in January on the great toe and has done well from that.  She still gets occasional transient diplopia on eating sweets mainly.  She was seen  by Dr. Randon Goldsmith ophthalmologist who found unremarkable eye exam.  She is quite active but feels she has gained 5 pounds.  She has not had any follow-up carotid ultrasound done since her stroke.  She has no new complaints. Update 04/13/2019 : She returns for follow-up after last visit a year ago.  She continues to do well and has not had any recurrent TIA or stroke symptoms since 2016.  She remains on Eliquis which she is tolerating well without bruising or bleeding.  She states her blood pressure is under good control today it is 112/75.  She remains on Crestor which she is tolerating well without muscle aches and pains.  She has had no new health issues.  She started jogging recently and is quite active and walks 4 miles daily.  She continues to have mild short-term memory difficulties but these are not progressively getting  worse.  She does read a lot and is participating in memory compensation strategies which she finds useful.  She plans to travel to [place] after Christmas for 3 weeks for his son's engagement.  She has no new complaints today. ROS:   14 system review of systems is positive for  memory loss, fatigue,  and all other systems negative  PMH:  Past Medical History:  Diagnosis Date   1st degree AV block    Asthma    Atrial fibrillation (HCC)    History of CVA (cerebrovascular accident) 11/20/14   Mitral valve prolapse    Osteopetrosis    Stroke [facility])     Social History:  Social History   Socioeconomic History   Marital status: Married    Spouse name: Not on file   Number of children: 2   Years of education: Not on file   Highest education level: Not on file  Occupational History   Not on file  Social Needs   Financial resource strain: Not on file   Food insecurity    Worry: Not on file    Inability: Not on file   Transportation needs    Medical: Not on file    Non-medical: Not on file  Tobacco Use   Smoking status: Former Smoker   Smokeless tobacco: Never Used   Tobacco comment: smoked in college   Substance and Sexual Activity   Alcohol use: No   Drug use: No   Sexual activity: Not Currently  Lifestyle   Physical activity    Days per week: Not on file    Minutes per session: Not on file   Stress: Not on file  Relationships   Social connections    Talks on phone: Not on file    Gets together: Not on file    Attends religious service: Not on file    Active member of club or organization: Not on file    Attends meetings of clubs or organizations: Not on file    Relationship status: Not on file   Intimate partner violence    Fear of current or ex partner: Not on file    Emotionally abused: Not on file    Physically abused: Not on file    Forced sexual activity: Not on file  Other Topics Concern   Not on file  Social History Narrative    Not on file    Medications:   Current Outpatient Medications on File Prior to Visit  Medication Sig Dispense Refill   alendronate (FOSAMAX) 70 MG tablet Take 70 mg by mouth once a week. Take with  a full glass of water on an empty stomach.     apixaban (ELIQUIS) 5 MG TABS tablet Take 1 tablet (5 mg total) by mouth 2 (two) times daily. 60 tablet 12   buPROPion (WELLBUTRIN XL) 150 MG 24 hr tablet Take 150 mg by mouth daily.     calcium citrate-vitamin D (CITRACAL+D) 315-200 MG-UNIT per tablet Take 1 tablet by mouth daily. [id number] ng     Cholecalciferol (VITAMIN D) 1000 UNITS capsule Take 1,000 Units by mouth daily.       Coenzyme Q10 (COQ-10) 100 MG CAPS Take 1 tablet by mouth daily.       Cyanocobalamin (VITAMIN B-12 PO) Take 1 tablet by mouth daily.     MAGNESIUM PO Take by mouth daily.     Multiple Vitamin (MULTIVITAMIN WITH MINERALS) TABS tablet Take 1 tablet by mouth daily.     rosuvastatin (CRESTOR) 5 MG tablet Take 1 tablet (5 mg total) by mouth at bedtime. 90 tablet 0   vitamin C (ASCORBIC ACID) 500 MG tablet Take by mouth.     No current facility-administered medications on file prior to visit.     Allergies:  No Known Allergies  Physical Exam General: well developed, well nourished middle-aged Caucasian lady, seated, in no evident distress Head: head normocephalic and atraumatic.   Neck: supple with no carotid or supraclavicular bruits Cardiovascular: regular rate and rhythm, no murmurs Musculoskeletal: no deformity Skin:  no rash/petichiae Vascular:  Normal pulses all extremities  Neurologic Exam Mental Status: Awake and fully alert. Oriented to place and time. Recent and remote memory intact. Attention span, concentration and fund of knowledge appropriate. Mood and affect appropriate. Mini-Mental status exam not done. Recall 3/3. Animal naming test not done . Cranial Nerves: Fundoscopic exam . Pupils equal, briskly reactive to light. Extraocular movements full  without nystagmus. Visual fields full to confrontation. Hearing intact. Mild left lower facial asymmetry when she smiles. Facial sensation intact. Face, tongue, palate moves normally and symmetrically.  Motor: Normal bulk and tone. Normal strength in all tested extremity muscles. Diminished fine finger movements on the left. Mild left grip weakness. Orbits right over left approximately. Sensory.: intact to touch , pinprick , position and vibratory sensation.  Coordination: Rapid alternating movements normal in all extremities. Finger-to-nose and heel-to-shin performed accurately bilaterally. Head shaking produces no nystagmus or vertigo. Fukuda stepping test is positive with the patient moving his several steps off the base and rotating slightly. Hallpike maneuver was negative. Gait and Station: Arises from chair without difficulty. Stance is normal. Gait demonstrates normal stride length and balance . Able to heel, toe and tandem walk without difficulty.  Reflexes: 1+ and symmetric. Toes downgoing.       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 She has done remarkably well with excellent physical recovery but has mild cognitive complaints which appear to be stable PLAN: I had a long discussion with the patient regarding her remote stroke and discuss stroke prevention strategies and answered questions.  Continue Eliquis for stroke prevention as well as strict control of hypertension with blood pressure goal below 130/90, lipids with LDL cholesterol goal below 70 mg percent and diabetes with hemoglobin A1c goal below 6.5%.  She was encouraged to eat a healthy diet with lots of fruits, vegetables, cereals, whole grains and to be active and exercise regularly.  She was encouraged to do cognitively challenging  activities like solving crossword puzzles, playing bridge and sudoku.  Her mild cognitive impairment appears to be stable.  She will return for follow-up in the future in a year or call earlier if necessary.Greater than 50% time during this 25 minute visit was spent on counseling and coordination of care about her memory difficulties and stroke risk   Delia Heady, MD  Note: This document was prepared with digital dictation and possible smart phrase technology. Any transcriptional errors that result from this process are unintentional.

## 2019-04-13 NOTE — Patient Instructions (Addendum)
I had a long discussion with the patient regarding her remote stroke and discuss stroke prevention strategies and answered questions.  Continue Eliquis for stroke prevention as well as strict control of hypertension with blood pressure goal below 130/90, lipids with LDL cholesterol goal below 70 mg percent and diabetes with hemoglobin A1c goal below 6.5%.  She was encouraged to eat a healthy diet with lots of fruits, vegetables, cereals, whole grains and to be active and exercise regularly.  She was encouraged to do cognitively challenging activities like solving crossword puzzles, playing bridge and sudoku.  Her mild cognitive impairment appears to be stable.  She will return for follow-up in the future in a year or call earlier if necessary.

## 2019-04-15 ENCOUNTER — Encounter: Payer: Self-pay | Admitting: Women's Health

## 2019-05-05 ENCOUNTER — Encounter: Payer: Self-pay | Admitting: Internal Medicine

## 2019-05-18 ENCOUNTER — Encounter: Payer: BLUE CROSS/BLUE SHIELD | Admitting: Women's Health

## 2019-06-18 ENCOUNTER — Encounter: Payer: Self-pay | Admitting: Cardiology

## 2019-06-18 NOTE — Telephone Encounter (Signed)
Please read

## 2019-06-22 ENCOUNTER — Encounter: Payer: Self-pay | Admitting: Women's Health

## 2019-06-26 ENCOUNTER — Ambulatory Visit: Payer: Self-pay | Admitting: Internal Medicine

## 2019-06-30 ENCOUNTER — Other Ambulatory Visit: Payer: Self-pay | Admitting: Neurology

## 2019-07-13 ENCOUNTER — Other Ambulatory Visit: Payer: Self-pay

## 2019-07-14 ENCOUNTER — Ambulatory Visit (INDEPENDENT_AMBULATORY_CARE_PROVIDER_SITE_OTHER): Payer: BC Managed Care – PPO | Admitting: Women's Health

## 2019-07-14 ENCOUNTER — Encounter: Payer: Self-pay | Admitting: Women's Health

## 2019-07-14 VITALS — BP 110/80 | Ht 66.0 in | Wt 121.0 lb

## 2019-07-14 DIAGNOSIS — Z01419 Encounter for gynecological examination (general) (routine) without abnormal findings: Secondary | ICD-10-CM

## 2019-07-14 NOTE — Patient Instructions (Signed)
Good to see you today Vitamin D 2000 IUs daily Good luck with your 5K race Shingrix vaccine shingles prevention vaccine 2 series shot you get at the pharmacy Health Maintenance for Postmenopausal Women Menopause is a normal process in which your ability to get pregnant comes to an end. This process happens slowly over many months or years, usually between the ages of 29 and 26. Menopause is complete when you have missed your menstrual periods for 12 months. It is important to talk with your health care provider about some of the most common conditions that affect women after menopause (postmenopausal women). These include heart disease, cancer, and bone loss (osteoporosis). Adopting a healthy lifestyle and getting preventive care can help to promote your health and wellness. The actions you take can also lower your chances of developing some of these common conditions. What should I know about menopause? During menopause, you may get a number of symptoms, such as:  Hot flashes. These can be moderate or severe.  Night sweats.  Decrease in sex drive.  Mood swings.  Headaches.  Tiredness.  Irritability.  Memory problems.  Insomnia. Choosing to treat or not to treat these symptoms is a decision that you make with your health care provider. Do I need hormone replacement therapy?  Hormone replacement therapy is effective in treating symptoms that are caused by menopause, such as hot flashes and night sweats.  Hormone replacement carries certain risks, especially as you become older. If you are thinking about using estrogen or estrogen with progestin, discuss the benefits and risks with your health care provider. What is my risk for heart disease and stroke? The risk of heart disease, heart attack, and stroke increases as you age. One of the causes may be a change in the body's hormones during menopause. This can affect how your body uses dietary fats, triglycerides, and cholesterol. Heart  attack and stroke are medical emergencies. There are many things that you can do to help prevent heart disease and stroke. Watch your blood pressure  High blood pressure causes heart disease and increases the risk of stroke. This is more likely to develop in people who have high blood pressure readings, are of African descent, or are overweight.  Have your blood pressure checked: ? Every 3-5 years if you are 50-81 years of age. ? Every year if you are 19 years old or older. Eat a healthy diet   Eat a diet that includes plenty of vegetables, fruits, low-fat dairy products, and lean protein.  Do not eat a lot of foods that are high in solid fats, added sugars, or sodium. Get regular exercise Get regular exercise. This is one of the most important things you can do for your health. Most adults should:  Try to exercise for at least 150 minutes each week. The exercise should increase your heart rate and make you sweat (moderate-intensity exercise).  Try to do strengthening exercises at least twice each week. Do these in addition to the moderate-intensity exercise.  Spend less time sitting. Even light physical activity can be beneficial. Other tips  Work with your health care provider to achieve or maintain a healthy weight.  Do not use any products that contain nicotine or tobacco, such as cigarettes, e-cigarettes, and chewing tobacco. If you need help quitting, ask your health care provider.  Know your numbers. Ask your health care provider to check your cholesterol and your blood sugar (glucose). Continue to have your blood tested as directed by your health care  provider. Do I need screening for cancer? Depending on your health history and family history, you may need to have cancer screening at different stages of your life. This may include screening for:  Breast cancer.  Cervical cancer.  Lung cancer.  Colorectal cancer. What is my risk for osteoporosis? After menopause, you  may be at increased risk for osteoporosis. Osteoporosis is a condition in which bone destruction happens more quickly than new bone creation. To help prevent osteoporosis or the bone fractures that can happen because of osteoporosis, you may take the following actions:  If you are 30-28 years old, get at least 1,000 mg of calcium and at least 600 mg of vitamin D per day.  If you are older than age 78 but younger than age 29, get at least 1,200 mg of calcium and at least 600 mg of vitamin D per day.  If you are older than age 76, get at least 1,200 mg of calcium and at least 800 mg of vitamin D per day. Smoking and drinking excessive alcohol increase the risk of osteoporosis. Eat foods that are rich in calcium and vitamin D, and do weight-bearing exercises several times each week as directed by your health care provider. How does menopause affect my mental health? Depression may occur at any age, but it is more common as you become older. Common symptoms of depression include:  Low or sad mood.  Changes in sleep patterns.  Changes in appetite or eating patterns.  Feeling an overall lack of motivation or enjoyment of activities that you previously enjoyed.  Frequent crying spells. Talk with your health care provider if you think that you are experiencing depression. General instructions See your health care provider for regular wellness exams and vaccines. This may include:  Scheduling regular health, dental, and eye exams.  Getting and maintaining your vaccines. These include: ? Influenza vaccine. Get this vaccine each year before the flu season begins. ? Pneumonia vaccine. ? Shingles vaccine. ? Tetanus, diphtheria, and pertussis (Tdap) booster vaccine. Your health care provider may also recommend other immunizations. Tell your health care provider if you have ever been abused or do not feel safe at home. Summary  Menopause is a normal process in which your ability to get pregnant  comes to an end.  This condition causes hot flashes, night sweats, decreased interest in sex, mood swings, headaches, or lack of sleep.  Treatment for this condition may include hormone replacement therapy.  Take actions to keep yourself healthy, including exercising regularly, eating a healthy diet, watching your weight, and checking your blood pressure and blood sugar levels.  Get screened for cancer and depression. Make sure that you are up to date with all your vaccines. This information is not intended to replace advice given to you by your health care provider. Make sure you discuss any questions you have with your health care provider. Document Revised: 05/07/2018 Document Reviewed: 05/07/2018 Elsevier Patient Education  2020 [employer].

## 2019-07-14 NOTE — Progress Notes (Signed)
Martha Clark 04-26-1956 [id number]    History:    Presents for annual exam.  Postmenopausal on no HRT with no bleeding.  Normal Pap and mammogram history.  10/2014 CVA from A. fib, had an ablation, robotic heart surgery for valve replacement.  2018- colonoscopy.  Has had Zostavax.  Osteoporosis primary care managing on Fosamax weekly and had DEXA scan January showed improvement.   Past medical history, past surgical history, family history and social history were all reviewed and documented in the EPIC chart. Has been cleared by cardiologist for exercise, training for 5K program in August. Has been retired for 3 years, tries to stay busy. Not sexually active- husband health.   ROS:  A ROS was performed and pertinent positives and negatives are included.  Exam:  Vitals:   07/14/19 1111  BP: 110/80  Weight: 121 lb (54.9 kg)  Height: 5\' 6"  (1.676 m)   Body mass index is 19.53 kg/m.   General appearance:  Normal Thyroid:  Symmetrical, normal in size, without palpable masses or nodularity. Respiratory  Auscultation:  Clear without wheezing or rhonchi Cardiovascular  Auscultation:  Regular rate, without rubs, murmurs or gallops  Edema/varicosities:  Not grossly evident Abdominal  Soft,nontender, without masses, guarding or rebound.  Liver/spleen:  No organomegaly noted  Hernia:  None appreciated  Skin  Inspection:  Grossly normal   Breasts: Examined lying and sitting.     Right: Without masses, retractions, discharge or axillary adenopathy.     Left: Without masses, retractions, discharge or axillary adenopathy. Gentitourinary   Inguinal/mons:  Normal without inguinal adenopathy  External genitalia:  Normal  BUS/Urethra/Skene's glands:  Normal  Vagina:  Normal  Cervix:  Normal  Uterus:  Normal in size, shape and contour.  Midline and mobile  Adnexa/parametria:     Rt: Without masses or tenderness.   Lt: Without masses or tenderness.  Anus and perineum: Normal  Digital  rectal exam: Normal sphincter tone without palpated masses or tenderness  Assessment/Plan:  64 y.o.   WF G1P1 +1 adopted for annual exam with no complaints of vaginal discharge, urinary symptoms, or abdominal pain.   Postmenopausal/no HRT/no bleeding 2016 CVA from A. fib with ablation/valve replacement doing well Primary care manages labs and meds Osteoporosis-primary care on Fosamax  Plan: Pap with HR HPV typing. Importance of weightbearing/balance type exercise reviewed and encouraged.  Home safety, fall prevention discussed. Has had Zostavax.  Pneumovax at 64.  SBEs, continue annual 3D screening mammogram, calcium rich foods, vitamin D 2000 daily encouraged. Continue exercising and maintaining healthy lifestyle.    2017 [facility], 11:56 AM 07/14/2019

## 2019-07-14 NOTE — Addendum Note (Signed)
Addended by: Tito Dine on: 07/14/2019 02:10 PM   Modules accepted: Orders

## 2019-07-15 LAB — PAP, TP IMAGING W/ HPV RNA, RFLX HPV TYPE 16,18/45: HPV DNA High Risk: NOT DETECTED

## 2019-07-21 ENCOUNTER — Telehealth: Payer: Self-pay | Admitting: *Deleted

## 2019-07-21 NOTE — Telephone Encounter (Signed)
Patient called requesting with questions about normal pap smear on 07/14/19. Patient was concerned about the interpretation all questions answered.

## 2019-11-16 ENCOUNTER — Other Ambulatory Visit: Payer: Self-pay

## 2019-11-19 ENCOUNTER — Ambulatory Visit (INDEPENDENT_AMBULATORY_CARE_PROVIDER_SITE_OTHER): Payer: BC Managed Care – PPO | Admitting: Nurse Practitioner

## 2019-11-19 ENCOUNTER — Other Ambulatory Visit: Payer: Self-pay

## 2019-11-19 ENCOUNTER — Encounter: Payer: Self-pay | Admitting: Nurse Practitioner

## 2019-11-19 VITALS — BP 110/80

## 2019-11-19 DIAGNOSIS — B373 Candidiasis of vulva and vagina: Secondary | ICD-10-CM

## 2019-11-19 DIAGNOSIS — R350 Frequency of micturition: Secondary | ICD-10-CM

## 2019-11-19 DIAGNOSIS — B3731 Acute candidiasis of vulva and vagina: Secondary | ICD-10-CM

## 2019-11-19 MED ORDER — FLUCONAZOLE 150 MG PO TABS: 150.0000 mg | ORAL_TABLET | Freq: Once | ORAL | 0 refills | Status: AC

## 2019-11-19 NOTE — Progress Notes (Signed)
   Acute Office Visit  Subjective:    Patient ID: Martha Clark, female    DOB: 1956-02-22, 64 y.o.   MRN: [id number]  Chief Complaint  Patient presents with  . urine odor    ml  . Urinary Frequency    HPI 64 year old presents today to follow-up on UTI.  She was recently treated with Cipro at an urgent care. She was seen then for some mild confusion and was found to have UTI. These symptoms have improved since treatment. Denies urinary symptoms today but does have a little bit of vaginal discomfort.  Finished a course of Monistat over-the-counter with some relief.   Review of Systems  Constitutional: Negative.   Gastrointestinal: Negative.   Genitourinary: Positive for vaginal pain (irritation/discomfort). Negative for dysuria, frequency, hematuria, urgency and vaginal discharge.       Objective:    Physical Exam Constitutional:      Appearance: Normal appearance.  Abdominal:     General: Abdomen is flat.     Palpations: Abdomen is soft.     Tenderness: There is no right CVA tenderness or left CVA tenderness.  Genitourinary:    General: Normal vulva.     Vagina: Normal.     Cervix: Normal.     BP 110/80 (BP Location: Right Arm, Patient Position: Sitting, Cuff Size: Normal)  Wt Readings from Last 3 Encounters:  07/14/19 121 lb (54.9 kg)  04/13/19 121 lb 12.8 oz (55.2 kg)  03/02/19 120 lb 6.4 oz (54.6 kg)   UA: Negative leukocytes, negative blood, specific gravity 1.027    Assessment & Plan:   Problem List Items Addressed This Visit    None    Visit Diagnoses    Urinary frequency    -  Primary   Relevant Orders   Urinalysis,Complete w/RFL Culture   Yeast vaginitis       Relevant Medications   fluconazole (DIFLUCAN) 150 MG tablet     Plan: UA unremarkable, reassurance provided.  One-time dose of Diflucan for vaginal irritation, most likely yeast from antibiotic.  Urine culture pending.  Follow-up if symptoms return.  Patient is agreeable to  plan.     Martha Clark [facility], 3:47 PM 11/19/2019

## 2019-11-20 LAB — URINALYSIS, COMPLETE W/RFL CULTURE
Bilirubin Urine: NEGATIVE
Glucose, UA: NEGATIVE
Hgb urine dipstick: NEGATIVE
Hyaline Cast: NONE SEEN /LPF
Ketones, ur: NEGATIVE
Leukocyte Esterase: NEGATIVE
Nitrites, Initial: NEGATIVE
Protein, ur: NEGATIVE
RBC / HPF: NONE SEEN /HPF (ref 0–2)
Specific Gravity, Urine: 1.027 (ref 1.001–1.03)
pH: 5 (ref 5.0–8.0)

## 2019-11-20 LAB — URINE CULTURE
MICRO NUMBER:: 10630112
SPECIMEN QUALITY:: ADEQUATE

## 2019-11-20 LAB — CULTURE INDICATED

## 2019-12-22 ENCOUNTER — Encounter: Payer: Self-pay | Admitting: Cardiology

## 2019-12-28 ENCOUNTER — Telehealth: Payer: Self-pay | Admitting: Internal Medicine

## 2019-12-28 ENCOUNTER — Telehealth: Payer: Self-pay

## 2019-12-28 NOTE — Telephone Encounter (Signed)
Patient stated that her HR is 95 bpm. She wants to know should she be concerned and make an appointment or does she just need to monitor it.

## 2019-12-28 NOTE — Telephone Encounter (Signed)
Error

## 2019-12-28 NOTE — Telephone Encounter (Signed)
Patient thinks she may be in A. Fib. Will be seen tomorrow.   Yates Decamp, MD, [facility] 12/28/2019, 6:21 PM Office: [phone]

## 2019-12-29 ENCOUNTER — Other Ambulatory Visit: Payer: Self-pay | Admitting: Cardiology

## 2019-12-29 ENCOUNTER — Other Ambulatory Visit: Payer: Self-pay

## 2019-12-29 ENCOUNTER — Other Ambulatory Visit (HOSPITAL_COMMUNITY): Payer: Self-pay

## 2019-12-29 ENCOUNTER — Encounter: Payer: Self-pay | Admitting: Cardiology

## 2019-12-29 ENCOUNTER — Other Ambulatory Visit (HOSPITAL_COMMUNITY)
Admission: RE | Admit: 2019-12-29 | Discharge: 2019-12-29 | Disposition: A | Discharge status: Home / Self Care | Payer: BC Managed Care – PPO | Source: Ambulatory Visit | Attending: Cardiology | Admitting: Cardiology

## 2019-12-29 ENCOUNTER — Ambulatory Visit: Payer: BC Managed Care – PPO | Admitting: Cardiology

## 2019-12-29 VITALS — BP 91/79 | HR 129 | Resp 16 | Ht 66.0 in | Wt 121.0 lb

## 2019-12-29 DIAGNOSIS — Z952 Presence of prosthetic heart valve: Secondary | ICD-10-CM

## 2019-12-29 DIAGNOSIS — I4892 Unspecified atrial flutter: Secondary | ICD-10-CM

## 2019-12-29 DIAGNOSIS — Z20822 Contact with and (suspected) exposure to covid-19: Secondary | ICD-10-CM | POA: Diagnosis not present

## 2019-12-29 DIAGNOSIS — Z01812 Encounter for preprocedural laboratory examination: Secondary | ICD-10-CM | POA: Diagnosis present

## 2019-12-29 DIAGNOSIS — Z298 Encounter for other specified prophylactic measures: Secondary | ICD-10-CM

## 2019-12-29 DIAGNOSIS — Z8679 Personal history of other diseases of the circulatory system: Secondary | ICD-10-CM

## 2019-12-29 LAB — SARS CORONAVIRUS 2 (TAT 6-24 HRS): SARS Coronavirus 2: NEGATIVE

## 2019-12-29 NOTE — Progress Notes (Signed)
Primary Physician/Referring:  Deland Pretty, MD  Patient ID: Martha Clark, female    DOB: Aug 27, 1955, 64 y.o.   MRN: [id number]  Chief Complaint  Patient presents with  . S/P MVR  . Atrial Fibrillation  . Follow-up    1 year   HPI:    Martha Clark  is a 64 y.o.  Caucasian female  Bioprosthetic mitral valve replacement 2008 (Dr Graylon Gunning [place]), atrial flutter s/p ablation 2016, h/o CVA.   She currently has today stating that she is back in atrial fibrillation, palpitations, elevated heart rate, fatigue.  She has also had episodes of mild dizziness.  No syncope, no neurologic deficits.  She is presently on Eliquis and she is tolerating this well.  States that she had labs 2 weeks ago and they were all within normal limits.  She was told to be prediabetic.  Past Medical History:  Diagnosis Date  . 1st degree AV block   . Asthma   . Atrial fibrillation ([place])   . History of CVA (cerebrovascular accident) 11/20/14  . Mitral valve prolapse   . Osteopetrosis   . Stroke [facility])    Past Surgical History:  Procedure Laterality Date  . ELECTROPHYSIOLOGIC STUDY N/A 01/17/2015   Procedure: A-Flutter Ablation;  Surgeon: Evans Lance, MD;  Location: [facility];  Service: Cardiovascular;  Laterality: N/A;  . MITRAL VALVE REPLACEMENT  2008  . RADIOLOGY WITH ANESTHESIA N/A 11/20/2014   Procedure: RADIOLOGY WITH ANESTHESIA;  Surgeon: Luanne Bras, MD;  Location: [place];  Service: Radiology;  Laterality: N/A;  . TOE SURGERY    . TUBAL LIGATION     Social History   Socioeconomic History  . Marital status: Married    Spouse name: Not on file  . Number of children: 2  . Years of education: Not on file  . Highest education level: Not on file  Occupational History  . Not on file  Tobacco Use  . Smoking status: Former Smoker    Types: Cigarettes  . Smokeless tobacco: Never Used  . Tobacco comment: smoked in college   Vaping Use  . Vaping Use: Never used    Substance and Sexual Activity  . Alcohol use: No  . Drug use: No  . Sexual activity: Not Currently  Other Topics Concern  . Not on file  Social History Narrative  . Not on file   Social Determinants of Health   Financial Resource Strain:   . Difficulty of Paying Living Expenses:   Food Insecurity:   . Worried About Charity fundraiser in the Last Year:   . Arboriculturist in the Last Year:   Transportation Needs:   . Film/video editor (Medical):   Marland Kitchen Lack of Transportation (Non-Medical):   Physical Activity:   . Days of Exercise per Week:   . Minutes of Exercise per Session:   Stress:   . Feeling of Stress :   Social Connections:   . Frequency of Communication with Friends and Family:   . Frequency of Social Gatherings with Friends and Family:   . Attends Religious Services:   . Active Member of Clubs or Organizations:   . Attends Archivist Meetings:   Marland Kitchen Marital Status:   Intimate Partner Violence:   . Fear of Current or Ex-Partner:   . Emotionally Abused:   Marland Kitchen Physically Abused:   . Sexually Abused:    ROS  Review of Systems  Constitutional: Positive for  malaise/fatigue.  Cardiovascular: Positive for palpitations. Negative for chest pain, dyspnea on exertion and leg swelling.  Gastrointestinal: Negative for melena.   Objective   Vitals with BMI 12/29/2019 11/19/2019 07/14/2019  Height 5' 6"  - 5' 6"   Weight 121 lbs - 121 lbs  BMI 26.94 - 85.46  Systolic 91 270 350  Diastolic 79 80 80  Pulse 093 - -    Blood pressure 91/79, pulse (!) 129, resp. rate 16, height 5' 6"  (1.676 m), weight 121 lb (54.9 kg), SpO2 94 %. Body mass index is 19.53 kg/m.   Physical Exam Constitutional:      General: She is not in acute distress.    Appearance: She is well-developed.  Neck:     Thyroid: No thyromegaly.     Vascular: No JVD.  Cardiovascular:     Rate and Rhythm: Tachycardia present. Rhythm irregular.     Pulses: Intact distal pulses.     Heart sounds: S1  normal and S2 normal. Murmur heard.  Midsystolic murmur is present with a grade of 1/6 at the upper right sternal border.  No gallop.      Comments: Right superficial varicose veins noted. No edema. No JVD Pulmonary:     Effort: Pulmonary effort is normal.     Breath sounds: Normal breath sounds.  Abdominal:     General: Bowel sounds are normal.     Palpations: Abdomen is soft.  Neurological:     Mental Status: She is alert.    Radiology: No results found.  Laboratory examination:   Cholesterol, total 155.000 M 02/16/2019 HDL 65.000 12/16/2019 LDL-C 77.000 12/16/2019 Triglycerides 47.000 12/16/2019  Hemoglobin 13.800 04/25/2019  Creatinine, Serum N/D  ALT (SGPT) 21.000 IU/ 02/16/2019  TSH 2.550 12/16/2019  Labs 02/16/2019: Total cholesterol 155, triglycerides 49, HDL 64, LDL 80.  CMP normal.   12/04/2017: RBC 5.5, hemoglobin 15.5, hematocrit 47, CBC otherwise normal.  Creatinine 0.9, EGFR 83/69, potassium 4.5, CMP normal.  Cholesterol 186, triglycerides 41, HDL 66, LDL 112.  TSH 1.83.   Medications and allergies  No Known Allergies   Current Outpatient Medications  Medication Instructions  . alendronate (FOSAMAX) 70 mg, Oral, Weekly, Take with a full glass of water on an empty stomach.   Marland Kitchen apixaban (ELIQUIS) 5 mg, Oral, 2 times daily  . buPROPion (WELLBUTRIN XL) 150 mg, Oral, Daily  . calcium citrate-vitamin D (CITRACAL+D) 315-200 MG-UNIT per tablet 1 tablet, Daily  . Coenzyme Q10 (COQ-10) 100 MG CAPS 1 tablet, Oral, Daily,    . Cyanocobalamin (VITAMIN B-12 PO) 1 tablet, Oral, Daily  . MAGNESIUM PO Oral, Daily  . Multiple Vitamin (MULTIVITAMIN WITH MINERALS) TABS tablet 1 tablet, Oral, Daily  . rosuvastatin (CRESTOR) 5 mg, Oral, Daily at bedtime  . vitamin C (ASCORBIC ACID) 500 MG tablet Oral  . Vitamin D 1,000 Units, Daily    Cardiac Studies:   Echocardiogram 01/13/2019: Left ventricle cavity is normal in size. Mild concentric hypertrophy of the left ventricle.  Normal LV systolic function with visual EF 50-55%. Abnormal septal wall motion due to post-operative valve. Diastolic function not assessed due to post-op valve status. Bioprosthetic mitral valve with normal functioning. Mean PG 4 mmHg at 75 bpm. Moderate (Grade II) mitral regurgitation. Mild to moderate tricuspid regurgitation. No evidence of pulmonary hypertension. No significant change compared to previous study in 2014.   Carotid artery duplex 03/27/2018: Right Carotid: Velocities in the right ICA are consistent with a 1-39% stenosis. Left Carotid: Velocities in the left ICA are consistent  with a 1-39% stenosis. Vertebrals:  Bilateral vertebral arteries demonstrate antegrade flow. Subclavians: Normal flow hemodynamics were seen in bilateral subclavian  Arteries.  EKG:  EKG 12/29/2019: Atrial flutter with variable AV conduction, ventricular rate 101 bpm.  Normal axis, right bundle branch block.  Poor R wave progression, cannot exclude anteroseptal infarct old.  No evidence of ischemia, normal QT interval.    EKG 03/02/2019: Sinus rhythm with first-degree AV block at the rate of 77 bpm, LAE, left axis deviation, left anterior physical block.  Right bundle branch block. Trifascicular block.  Poor R-wave progression, cannot exclude anteroseptal infarct old.  Probably normal variant.  Normal QT interval. No significant change from  09/26/2017.  I am now I would you good good  Assessment     ICD-10-CM   1. S/P MVR (mitral valve replacement)  Z95.2 EKG 12-Lead    PCV ECHOCARDIOGRAM COMPLETE  2. S/P ablation of atrial flutter 01/17/15  Z98.890    Z86.79   3. Indication present for endocarditis prophylaxis  Z29.8   4. Paroxysmal atrial flutter (HCC)  I48.92 Ambulatory referral to Cardiac Electrophysiology    PCV ECHOCARDIOGRAM COMPLETE    CANCELED: Basic metabolic panel    CANCELED: CBC   CHA2DS2-VASc Score is 3.  Yearly risk of stroke: 3.2% (F, CVA).  Score of 1=0.6; 2=2.2; 3=3.2; 4=4.8;  5=7.2; 6=9.8; 7=>9.8) -(CHF; HTN; vasc disease DM,  Female = 1; Age <65 =0; 65-74 = 1,  >75 =2; stroke/embolism= 2).    Recommendations:   MYLIA PONDEXTER  is a 64 y.o. Caucasian female Bioprosthetic mitral valve replacement 2008 (Dr Amador Cunas, [facility] ), asymptomatic trifascicular block, H/O atrial flutter s/p ablation 2016, h/o CVA prior to ablation.   Patient called me yesterday stating that she has probably gone back into atrial fibrillation.  I brought her in to be evaluated today, patient is back in atrial flutter with variable A-V conduction.  She is tachycardic, feels fatigued and dizzy.  I am unable to use any negative chronotropic agents in view of low blood pressure that she always has.  She is presently on Eliquis, I have reassured her, she was extremely stressed that she is going to have a recurrent stroke.  I will schedule her for direct-current cardioversion tomorrow morning.  I will also refer her back to Dr. Cristopher Peru for evaluation of atrial flutter and consideration for ablation again if appropriate.  I reviewed her labs from her PCP, lipids under good control, she has recently been told to have prediabetes.  She is concerned about overall wellbeing, diabetes mellitus, I simply reassured her, advised her that she should not be concerned about her weight and be incessantly thinking about all her health issues.  She appears to be extremely anxious today.  She is also moving to [place] sometime next month, her son is getting married and daughter is going back to graduate school.  I will see her back in 2 weeks for follow-up.  I will also schedule for an echocardiogram.    Patient is aware of endocarditis prophylaxis.   Adrian Prows, MD, [facility] 12/29/2019, 9:33 AM Office: [phone]

## 2019-12-29 NOTE — H&P (View-Only) (Signed)
Primary Physician/Referring:  Deland Pretty, MD  Patient ID: Martha Clark, female    DOB: 03/16/1956, 64 y.o.   MRN: [id number]  Chief Complaint  Patient presents with  . S/P MVR  . Atrial Fibrillation  . Follow-up    1 year   HPI:    Martha Clark  is a 64 y.o.  Caucasian female  Bioprosthetic mitral valve replacement 2008 (Dr Graylon Gunning [place]), atrial flutter s/p ablation 2016, h/o CVA.   She currently has today stating that she is back in atrial fibrillation, palpitations, elevated heart rate, fatigue.  She has also had episodes of mild dizziness.  No syncope, no neurologic deficits.  She is presently on Eliquis and she is tolerating this well.  States that she had labs 2 weeks ago and they were all within normal limits.  She was told to be prediabetic.  Past Medical History:  Diagnosis Date  . 1st degree AV block   . Asthma   . Atrial fibrillation ([place])   . History of CVA (cerebrovascular accident) 11/20/14  . Mitral valve prolapse   . Osteopetrosis   . Stroke [facility])    Past Surgical History:  Procedure Laterality Date  . ELECTROPHYSIOLOGIC STUDY N/A 01/17/2015   Procedure: A-Flutter Ablation;  Surgeon: Evans Lance, MD;  Location: [facility];  Service: Cardiovascular;  Laterality: N/A;  . MITRAL VALVE REPLACEMENT  2008  . RADIOLOGY WITH ANESTHESIA N/A 11/20/2014   Procedure: RADIOLOGY WITH ANESTHESIA;  Surgeon: Luanne Bras, MD;  Location: [facility];  Service: Radiology;  Laterality: N/A;  . TOE SURGERY    . TUBAL LIGATION     Social History   Socioeconomic History  . Marital status: Married    Spouse name: Not on file  . Number of children: 2  . Years of education: Not on file  . Highest education level: Not on file  Occupational History  . Not on file  Tobacco Use  . Smoking status: Former Smoker    Types: Cigarettes  . Smokeless tobacco: Never Used  . Tobacco comment: smoked in college   Vaping Use  . Vaping Use: Never used    Substance and Sexual Activity  . Alcohol use: No  . Drug use: No  . Sexual activity: Not Currently  Other Topics Concern  . Not on file  Social History Narrative  . Not on file   Social Determinants of Health   Financial Resource Strain:   . Difficulty of Paying Living Expenses:   Food Insecurity:   . Worried About Charity fundraiser in the Last Year:   . Arboriculturist in the Last Year:   Transportation Needs:   . Film/video editor (Medical):   Marland Kitchen Lack of Transportation (Non-Medical):   Physical Activity:   . Days of Exercise per Week:   . Minutes of Exercise per Session:   Stress:   . Feeling of Stress :   Social Connections:   . Frequency of Communication with Friends and Family:   . Frequency of Social Gatherings with Friends and Family:   . Attends Religious Services:   . Active Member of Clubs or Organizations:   . Attends Archivist Meetings:   Marland Kitchen Marital Status:   Intimate Partner Violence:   . Fear of Current or Ex-Partner:   . Emotionally Abused:   Marland Kitchen Physically Abused:   . Sexually Abused:    ROS  Review of Systems  Constitutional: Positive for  malaise/fatigue.  Cardiovascular: Positive for palpitations. Negative for chest pain, dyspnea on exertion and leg swelling.  Gastrointestinal: Negative for melena.   Objective   Vitals with BMI 12/29/2019 11/19/2019 07/14/2019  Height 5' 6"  - 5' 6"   Weight 121 lbs - 121 lbs  BMI 16.10 - 96.04  Systolic 91 540 981  Diastolic 79 80 80  Pulse 191 - -    Blood pressure 91/79, pulse (!) 129, resp. rate 16, height 5' 6"  (1.676 m), weight 121 lb (54.9 kg), SpO2 94 %. Body mass index is 19.53 kg/m.   Physical Exam Constitutional:      General: She is not in acute distress.    Appearance: She is well-developed.  Neck:     Thyroid: No thyromegaly.     Vascular: No JVD.  Cardiovascular:     Rate and Rhythm: Tachycardia present. Rhythm irregular.     Pulses: Intact distal pulses.     Heart sounds: S1  normal and S2 normal. Murmur heard.  Midsystolic murmur is present with a grade of 1/6 at the upper right sternal border.  No gallop.      Comments: Right superficial varicose veins noted. No edema. No JVD Pulmonary:     Effort: Pulmonary effort is normal.     Breath sounds: Normal breath sounds.  Abdominal:     General: Bowel sounds are normal.     Palpations: Abdomen is soft.  Neurological:     Mental Status: She is alert.    Radiology: No results found.  Laboratory examination:   Cholesterol, total 155.000 M 02/16/2019 HDL 65.000 12/16/2019 LDL-C 77.000 12/16/2019 Triglycerides 47.000 12/16/2019  Hemoglobin 13.800 04/25/2019  Creatinine, Serum N/D  ALT (SGPT) 21.000 IU/ 02/16/2019  TSH 2.550 12/16/2019  Labs 02/16/2019: Total cholesterol 155, triglycerides 49, HDL 64, LDL 80.  CMP normal.   12/04/2017: RBC 5.5, hemoglobin 15.5, hematocrit 47, CBC otherwise normal.  Creatinine 0.9, EGFR 83/69, potassium 4.5, CMP normal.  Cholesterol 186, triglycerides 41, HDL 66, LDL 112.  TSH 1.83.   Medications and allergies  No Known Allergies   Current Outpatient Medications  Medication Instructions  . alendronate (FOSAMAX) 70 mg, Oral, Weekly, Take with a full glass of water on an empty stomach.   Marland Kitchen apixaban (ELIQUIS) 5 mg, Oral, 2 times daily  . buPROPion (WELLBUTRIN XL) 150 mg, Oral, Daily  . calcium citrate-vitamin D (CITRACAL+D) 315-200 MG-UNIT per tablet 1 tablet, Daily  . Coenzyme Q10 (COQ-10) 100 MG CAPS 1 tablet, Oral, Daily,    . Cyanocobalamin (VITAMIN B-12 PO) 1 tablet, Oral, Daily  . MAGNESIUM PO Oral, Daily  . Multiple Vitamin (MULTIVITAMIN WITH MINERALS) TABS tablet 1 tablet, Oral, Daily  . rosuvastatin (CRESTOR) 5 mg, Oral, Daily at bedtime  . vitamin C (ASCORBIC ACID) 500 MG tablet Oral  . Vitamin D 1,000 Units, Daily    Cardiac Studies:   Echocardiogram 01/13/2019: Left ventricle cavity is normal in size. Mild concentric hypertrophy of the left ventricle.  Normal LV systolic function with visual EF 50-55%. Abnormal septal wall motion due to post-operative valve. Diastolic function not assessed due to post-op valve status. Bioprosthetic mitral valve with normal functioning. Mean PG 4 mmHg at 75 bpm. Moderate (Grade II) mitral regurgitation. Mild to moderate tricuspid regurgitation. No evidence of pulmonary hypertension. No significant change compared to previous study in 2014.   Carotid artery duplex 03/27/2018: Right Carotid: Velocities in the right ICA are consistent with a 1-39% stenosis. Left Carotid: Velocities in the left ICA are consistent  with a 1-39% stenosis. Vertebrals:  Bilateral vertebral arteries demonstrate antegrade flow. Subclavians: Normal flow hemodynamics were seen in bilateral subclavian  Arteries.  EKG:  EKG 12/29/2019: Atrial flutter with variable AV conduction, ventricular rate 101 bpm.  Normal axis, right bundle branch block.  Poor R wave progression, cannot exclude anteroseptal infarct old.  No evidence of ischemia, normal QT interval.    EKG 03/02/2019: Sinus rhythm with first-degree AV block at the rate of 77 bpm, LAE, left axis deviation, left anterior physical block.  Right bundle branch block. Trifascicular block.  Poor R-wave progression, cannot exclude anteroseptal infarct old.  Probably normal variant.  Normal QT interval. No significant change from  09/26/2017.  I am now I would you good good  Assessment     ICD-10-CM   1. S/P MVR (mitral valve replacement)  Z95.2 EKG 12-Lead    PCV ECHOCARDIOGRAM COMPLETE  2. S/P ablation of atrial flutter 01/17/15  Z98.890    Z86.79   3. Indication present for endocarditis prophylaxis  Z29.8   4. Paroxysmal atrial flutter (HCC)  I48.92 Ambulatory referral to Cardiac Electrophysiology    PCV ECHOCARDIOGRAM COMPLETE    CANCELED: Basic metabolic panel    CANCELED: CBC   CHA2DS2-VASc Score is 3.  Yearly risk of stroke: 3.2% (F, CVA).  Score of 1=0.6; 2=2.2; 3=3.2; 4=4.8;  5=7.2; 6=9.8; 7=>9.8) -(CHF; HTN; vasc disease DM,  Female = 1; Age <65 =0; 65-74 = 1,  >75 =2; stroke/embolism= 2).    Recommendations:   Martha Clark  is a 64 y.o. Caucasian female Bioprosthetic mitral valve replacement 2008 (Dr Amador Cunas, [facility]), asymptomatic trifascicular block, H/O atrial flutter s/p ablation 2016, h/o CVA prior to ablation.   Patient called me yesterday stating that she has probably gone back into atrial fibrillation.  I brought her in to be evaluated today, patient is back in atrial flutter with variable A-V conduction.  She is tachycardic, feels fatigued and dizzy.  I am unable to use any negative chronotropic agents in view of low blood pressure that she always has.  She is presently on Eliquis, I have reassured her, she was extremely stressed that she is going to have a recurrent stroke.  I will schedule her for direct-current cardioversion tomorrow morning.  I will also refer her back to Dr. Cristopher Peru for evaluation of atrial flutter and consideration for ablation again if appropriate.  I reviewed her labs from her PCP, lipids under good control, she has recently been told to have prediabetes.  She is concerned about overall wellbeing, diabetes mellitus, I simply reassured her, advised her that she should not be concerned about her weight and be incessantly thinking about all her health issues.  She appears to be extremely anxious today.  She is also moving to [place] sometime next month, her son is getting married and daughter is going back to graduate school.  I will see her back in 2 weeks for follow-up.  I will also schedule for an echocardiogram.    Patient is aware of endocarditis prophylaxis.   Adrian Prows, MD, [facility] 12/29/2019, 9:33 AM Office: [phone]

## 2019-12-30 ENCOUNTER — Ambulatory Visit (HOSPITAL_COMMUNITY): Payer: BC Managed Care – PPO | Admitting: Certified Registered"

## 2019-12-30 ENCOUNTER — Other Ambulatory Visit: Payer: Self-pay

## 2019-12-30 ENCOUNTER — Ambulatory Visit (HOSPITAL_COMMUNITY)
Admission: RE | Admit: 2019-12-30 | Discharge: 2019-12-30 | Disposition: A | Discharge status: Home / Self Care | Payer: BC Managed Care – PPO | Attending: Cardiology | Admitting: Cardiology

## 2019-12-30 ENCOUNTER — Encounter (HOSPITAL_COMMUNITY)
Admission: RE | Disposition: A | Discharge status: Home / Self Care | Payer: Self-pay | Source: Home / Self Care | Attending: Cardiology

## 2019-12-30 DIAGNOSIS — Z87891 Personal history of nicotine dependence: Secondary | ICD-10-CM | POA: Diagnosis not present

## 2019-12-30 DIAGNOSIS — Z7901 Long term (current) use of anticoagulants: Secondary | ICD-10-CM | POA: Diagnosis not present

## 2019-12-30 DIAGNOSIS — I440 Atrioventricular block, first degree: Secondary | ICD-10-CM | POA: Diagnosis not present

## 2019-12-30 DIAGNOSIS — Z79899 Other long term (current) drug therapy: Secondary | ICD-10-CM | POA: Insufficient documentation

## 2019-12-30 DIAGNOSIS — I4891 Unspecified atrial fibrillation: Secondary | ICD-10-CM | POA: Insufficient documentation

## 2019-12-30 DIAGNOSIS — Z8673 Personal history of transient ischemic attack (TIA), and cerebral infarction without residual deficits: Secondary | ICD-10-CM | POA: Diagnosis not present

## 2019-12-30 DIAGNOSIS — I4892 Unspecified atrial flutter: Secondary | ICD-10-CM | POA: Insufficient documentation

## 2019-12-30 DIAGNOSIS — J45909 Unspecified asthma, uncomplicated: Secondary | ICD-10-CM | POA: Insufficient documentation

## 2019-12-30 DIAGNOSIS — Z8679 Personal history of other diseases of the circulatory system: Secondary | ICD-10-CM | POA: Insufficient documentation

## 2019-12-30 DIAGNOSIS — Z952 Presence of prosthetic heart valve: Secondary | ICD-10-CM | POA: Insufficient documentation

## 2019-12-30 HISTORY — PX: CARDIOVERSION: SHX1299

## 2019-12-30 LAB — POCT I-STAT, CHEM 8
BUN: 20 mg/dL (ref 8–23)
Calcium, Ion: 1.07 mmol/L — ABNORMAL LOW (ref 1.15–1.40)
Chloride: 103 mmol/L (ref 98–111)
Creatinine, Ser: 0.8 mg/dL (ref 0.44–1.00)
Glucose, Bld: 87 mg/dL (ref 70–99)
HCT: 45 % (ref 36.0–46.0)
Hemoglobin: 15.3 g/dL — ABNORMAL HIGH (ref 12.0–15.0)
Potassium: 4.5 mmol/L (ref 3.5–5.1)
Sodium: 142 mmol/L (ref 135–145)
TCO2: 28 mmol/L (ref 22–32)

## 2019-12-30 SURGERY — CARDIOVERSION
Anesthesia: General

## 2019-12-30 MED ORDER — LIDOCAINE 2% (20 MG/ML) 5 ML SYRINGE
INTRAMUSCULAR | Status: DC | PRN
  Administered 2019-12-30: 40 mg via INTRAVENOUS

## 2019-12-30 MED ORDER — PROPOFOL 10 MG/ML IV BOLUS
INTRAVENOUS | Status: DC | PRN
  Administered 2019-12-30: 80 mg via INTRAVENOUS

## 2019-12-30 NOTE — Anesthesia Postprocedure Evaluation (Signed)
Anesthesia Post Note  Patient: Martha Clark  Procedure(s) Performed: CARDIOVERSION (N/A )     Patient location during evaluation: Endoscopy Anesthesia Type: General Level of consciousness: awake Pain management: pain level controlled Vital Signs Assessment: post-procedure vital signs reviewed and stable Respiratory status: spontaneous breathing Cardiovascular status: stable Postop Assessment: no apparent nausea or vomiting Anesthetic complications: no   No complications documented.  Last Vitals:  Vitals:   12/30/19 0911 12/30/19 0914  BP:  109/65  Pulse: 63   Resp: (!) 21 19  Temp:    SpO2: 100% 100%    Last Pain:  Vitals:   12/30/19 0914  TempSrc:   PainSc: 0-No pain                 [name]

## 2019-12-30 NOTE — Transfer of Care (Signed)
Immediate Anesthesia Transfer of Care Note  Patient: Martha Clark  Procedure(s) Performed: CARDIOVERSION (N/A )  Patient Location: Endoscopy Unit  Anesthesia Type:MAC  Level of Consciousness: drowsy and patient cooperative  Airway & Oxygen Therapy: Patient Spontanous Breathing  Post-op Assessment: Report given to RN and Post -op Vital signs reviewed and stable  Post vital signs: Reviewed and stable  Last Vitals:  Vitals Value Taken Time  BP    Temp    Pulse 63 12/30/19 0911  Resp 21 12/30/19 0911  SpO2 100 % 12/30/19 0911    Last Pain:  Vitals:   12/30/19 0814  TempSrc: Oral  PainSc: 0-No pain         Complications: No complications documented.

## 2019-12-30 NOTE — Discharge Instructions (Signed)
Electrical Cardioversio Follow these instructions at home:  Do not drive for 24 hours if you were given a sedative during your procedure.  Take over-the-counter and prescription medicines only as told by your health care provider.  Ask your health care provider how to check your pulse. Check it often.  Rest for 48 hours after the procedure or as told by your health care provider.  Avoid or limit your caffeine use as told by your health care provider.  Keep all follow-up visits as told by your health care provider. This is important. Contact a health care provider if:  You feel like your heart is beating too quickly or your pulse is not regular.  You have a serious muscle cramp that does not go away. Get help right away if:  You have discomfort in your chest.  You are dizzy or you feel faint.  You have trouble breathing or you are short of breath.  Your speech is slurred.  You have trouble moving an arm or leg on one side of your body.  Your fingers or toes turn cold or blue. Summary  Electrical cardioversion is the delivery of a jolt of electricity to restore a normal rhythm to the heart.  This procedure may be done right away in an emergency or may be a scheduled procedure if the condition is not an emergency.  Generally, this is a safe procedure.  After the procedure, check your pulse often as told by your health care provider. This information is not intended to replace advice given to you by your health care provider. Make sure you discuss any questions you have with your health care provider. Document Revised: 12/15/2018 Document Reviewed: 12/15/2018 Elsevier Patient Education  2020 [employer].

## 2019-12-30 NOTE — Anesthesia Preprocedure Evaluation (Signed)
Anesthesia Evaluation  Patient identified by MRN, date of birth, ID band Patient awake    Reviewed: Allergy & Precautions, NPO status , Patient's Chart, lab work & pertinent test results  Airway Mallampati: II  TM Distance: >3 FB     Dental   Pulmonary asthma , former smoker,    breath sounds clear to auscultation       Cardiovascular + dysrhythmias Atrial Fibrillation  Rhythm:Irregular Rate:Normal     Neuro/Psych    GI/Hepatic negative GI ROS, Neg liver ROS,   Endo/Other  negative endocrine ROS  Renal/GU negative Renal ROS     Musculoskeletal   Abdominal   Peds  Hematology   Anesthesia Other Findings   Reproductive/Obstetrics                             Anesthesia Physical Anesthesia Plan  ASA: III  Anesthesia Plan: General   Post-op Pain Management:    Induction: Intravenous  PONV Risk Score and Plan: 3 and Propofol infusion  Airway Management Planned: Simple Face Mask  Additional Equipment:   Intra-op Plan:   Post-operative Plan:   Informed Consent: I have reviewed the patients History and Physical, chart, labs and discussed the procedure including the risks, benefits and alternatives for the proposed anesthesia with the patient or authorized representative who has indicated his/her understanding and acceptance.     Dental advisory given  Plan Discussed with: Anesthesiologist and CRNA  Anesthesia Plan Comments:         Anesthesia Quick Evaluation

## 2019-12-30 NOTE — CV Procedure (Signed)
Direct current cardioversion:  Indication symptomatic A. Flutter.  Procedure: Using 80 mg of IV Propofol and 40 IV Lidocaine (for reducing venous pain) for achieving deep sedation, synchronized direct current cardioversion performed. Patient was delivered with 50 Joules of electricity X 1 with success to NSR. Patient tolerated the procedure well. No immediate complication noted.   Yates Decamp, MD, [facility] 12/30/2019, 9:11 AM Office: [phone]

## 2019-12-30 NOTE — Interval H&P Note (Signed)
History and Physical Interval Note:  12/30/2019 9:03 AM  [name]  has presented today for surgery, with the diagnosis of afib.  The various methods of treatment have been discussed with the patient and family. After consideration of risks, benefits and other options for treatment, the patient has consented to  Procedure(s): CARDIOVERSION (N/A) as a surgical intervention.  The patient's history has been reviewed, patient examined, no change in status, stable for surgery.  I have reviewed the patient's chart and labs.  Questions were answered to the patient's satisfaction.     Martha Clark

## 2019-12-31 ENCOUNTER — Encounter (HOSPITAL_COMMUNITY): Payer: Self-pay | Admitting: Cardiology

## 2020-01-01 ENCOUNTER — Other Ambulatory Visit: Payer: Self-pay

## 2020-01-01 ENCOUNTER — Other Ambulatory Visit: Payer: BC Managed Care – PPO

## 2020-01-01 ENCOUNTER — Ambulatory Visit: Payer: BC Managed Care – PPO

## 2020-01-01 DIAGNOSIS — Z952 Presence of prosthetic heart valve: Secondary | ICD-10-CM

## 2020-01-01 DIAGNOSIS — I4892 Unspecified atrial flutter: Secondary | ICD-10-CM

## 2020-01-15 ENCOUNTER — Other Ambulatory Visit: Payer: Self-pay

## 2020-01-15 ENCOUNTER — Ambulatory Visit: Payer: BC Managed Care – PPO | Admitting: Cardiology

## 2020-01-15 ENCOUNTER — Encounter: Payer: Self-pay | Admitting: Cardiology

## 2020-01-15 VITALS — BP 93/63 | HR 62 | Resp 15 | Ht 66.0 in | Wt 120.0 lb

## 2020-01-15 DIAGNOSIS — Z8679 Personal history of other diseases of the circulatory system: Secondary | ICD-10-CM

## 2020-01-15 DIAGNOSIS — Z952 Presence of prosthetic heart valve: Secondary | ICD-10-CM

## 2020-01-15 DIAGNOSIS — Z9889 Other specified postprocedural states: Secondary | ICD-10-CM

## 2020-01-15 DIAGNOSIS — I4892 Unspecified atrial flutter: Secondary | ICD-10-CM

## 2020-01-15 DIAGNOSIS — Z298 Encounter for other specified prophylactic measures: Secondary | ICD-10-CM

## 2020-01-15 DIAGNOSIS — I453 Trifascicular block: Secondary | ICD-10-CM

## 2020-01-15 NOTE — Progress Notes (Signed)
Primary Physician/Referring:  Holland Commons, FNP  Patient ID: Martha Clark, female    DOB: 01/08/1956, 64 y.o.   MRN: [id number]  Chief Complaint  Patient presents with  . Follow-up    2 week  . Atrial Flutter   HPI:    Martha Clark  is a 64 y.o.  Caucasian female  Bioprosthetic mitral valve replacement 2008 (Dr Graylon Gunning [place]), atrial flutter s/p ablation 2016, h/o CVA.   Patient seen in the office 12/29/19 with complaints of fatigue, palpitations, elevated heart rate. At this visit she was noted to be in atrial flutter with variable AV conduction. Unable to use negative chronotropic agents at that time due to low blood pressure, which is common for the patient. She was already on Eliquis. Patient underwent direct-current cardioversion on 12/30/2019. She was also referred to Dr. Cristopher Peru again for evaluation of atrial flutter and consideration for ablation again. Also ordered an echo.   Presents today for 2 week follow up after direct-current cardioversion for atrial flutter. Patient reports she has been doing well since cardioversion. Denies dizziness, palpitations, SOB, fatigue, chest pain, syncope. Patient currently on Eliquis, no bleeding reported. Reports she has been careful to maintain healthy diet and exercises regularly. She will be moving to [place] next month.  Past Medical History:  Diagnosis Date  . 1st degree AV block   . Asthma   . ASTHMA, UNSPECIFIED, UNSPECIFIED STATUS 05/30/2009   Qualifier: Diagnosis of  By: Lia Foyer, MD, Jaquelyn Bitter   . Atrial fibrillation ([place])   . Atrial flutter ([place]) 11/16/2014   Cardioversion same day to NSR. Started Flecainide.   . Chest pain on respiration 10/07/2011  . Coarctation of aorta, recurrent, post-intervention   . 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 History of CVA (cerebrovascular accident) 11/20/14  . Hypokalemia   . Mitral valve prolapse    . MITRAL VALVE REPLACEMENT, HX OF 05/30/2009   Qualifier: Diagnosis of  By: Lia Foyer, MD, Jaquelyn Bitter  Operative note from [facility]) is in the notes section of EPIC  Oct 06, 2009   Study Conclusions    - Left ventricle: There was mild concentric hypertrophy. Systolic     function was normal. The estimated ejection fraction was in the     range of 55% to 60%. Doppler parameters are consistent with     abnormal left ventricular relax  . Osteopetrosis   . Other osteoporosis 05/30/2009   Qualifier: Diagnosis of  By: Lia Foyer, MD, Jaquelyn Bitter   . S/P ablation of atrial flutter 01/17/15 10/01/2018  . Stroke [facility])    Past Surgical History:  Procedure Laterality Date  . CARDIOVERSION N/A 12/30/2019   Procedure: CARDIOVERSION;  Surgeon: Adrian Prows, MD;  Location: [place];  Service: Cardiovascular;  Laterality: N/A;  . ELECTROPHYSIOLOGIC STUDY N/A 01/17/2015   Procedure: A-Flutter Ablation;  Surgeon: Evans Lance, MD;  Location: [place] CV LAB;  Service: Cardiovascular;  Laterality: N/A;  . MITRAL VALVE REPLACEMENT  2008  . RADIOLOGY WITH ANESTHESIA N/A 11/20/2014   Procedure: RADIOLOGY WITH ANESTHESIA;  Surgeon: Luanne Bras, MD;  Location: [place];  Service: Radiology;  Laterality: N/A;  . TOE SURGERY    . TUBAL LIGATION     Social History   Tobacco Use  . Smoking status: Former Smoker    Types: Cigarettes  . Smokeless tobacco: Never Used  . Tobacco comment: smoked in college  Substance Use Topics  . Alcohol use: No   Marital Status: Married   ROS  Review of Systems  Constitutional: Negative for malaise/fatigue.  Cardiovascular: Negative for chest pain, dyspnea on exertion, leg swelling, palpitations and syncope.  Respiratory: Negative for shortness of breath.   Hematologic/Lymphatic: Negative for bleeding problem. Does not bruise/bleed easily.  Gastrointestinal: Negative for melena.   Objective   Vitals with BMI 01/15/2020 12/30/2019 12/30/2019  Height 5\' 6"  - -   Weight 120 lbs - -  BMI 19.38 - -  Systolic 93 125 112  Diastolic 63 77 67  Pulse 62 59 -    Blood pressure 93/63, pulse 62, resp. rate 15, height 5\' 6"  (1.676 m), weight 120 lb (54.4 kg), SpO2 100 %. Body mass index is 19.37 kg/m.   Physical Exam Constitutional:      General: She is not in acute distress.    Appearance: She is well-developed.  Neck:     Thyroid: No thyromegaly.     Vascular: No JVD.  Cardiovascular:     Rate and Rhythm: Normal rate and regular rhythm.     Pulses: Intact distal pulses.     Heart sounds: S1 normal and S2 normal. Murmur heard.  Midsystolic murmur is present with a grade of 1/6 at the upper right sternal border.  No gallop.      Comments: No edema. No JVD Pulmonary:     Effort: Pulmonary effort is normal.     Breath sounds: Normal breath sounds.  Abdominal:     General: Bowel sounds are normal.     Palpations: Abdomen is soft.  Neurological:     Mental Status: She is alert.    Radiology: No results found.  Laboratory examination:   CMP Latest Ref Rng & Units 12/30/2019 01/10/2015 11/23/2014  Glucose 70 - 99 mg/dL 87 01/12/2015) 11/25/2014)  BUN 8 - 23 mg/dL 20 21 739(V)  Creatinine 0.44 - 1.00 mg/dL 591(Q <1(Z 8.82  Sodium 135 - 145 mmol/L 142 142 138  Potassium 3.5 - 5.1 mmol/L 4.5 4.6 4.2  Chloride 98 - 111 mmol/L 103 105 107  CO2 19 - 32 mEq/L - 32 28  Calcium 8.4 - 10.5 mg/dL - 9.4 8.0(L)  Total Protein 6.5 - 8.1 g/dL - - -  Total Bilirubin 0.3 - 1.2 mg/dL - - -  Alkaline Phos 38 - 126 U/L - - -  AST 15 - 41 U/L - - -  ALT 14 - 54 U/L - - -   CBC Latest Ref Rng & Units 12/30/2019 01/10/2015 11/23/2014  WBC 4.0 - 10.5 K/uL - 7.0 6.8  Hemoglobin 12.0 - 15.0 g/dL 15.3(H) 15.9(H) 13.0  Hematocrit 36 - 46 % 45.0 47.5(H) 39.5  Platelets 150 - 400 K/uL - 168.0 178   External Labs:  Cholesterol, total 155.000 M 02/16/2019 HDL 65.000 12/16/2019 LDL-C 77.000 12/16/2019 Triglycerides 47.000 12/16/2019  Hemoglobin 13.800 04/25/2019  Creatinine,  Serum N/D  ALT (SGPT) 21.000 IU/ 02/16/2019  TSH 2.550 12/16/2019  Labs 02/16/2019: Total cholesterol 155, triglycerides 49, HDL 64, LDL 80.  CMP normal.   12/04/2017: RBC 5.5, hemoglobin 15.5, hematocrit 47, CBC otherwise normal.  Creatinine 0.9, EGFR 83/69, potassium 4.5, CMP normal.  Cholesterol 186, triglycerides 41, HDL 66, LDL 112.  TSH 1.83.   Medications and allergies   Allergies  Allergen Reactions  . Dairy Aid [Lactase]     Abdominal pressure and swelling  . Wheat Bran Other (See Comments)    Gluten Abdominal pressure  and swelling     Current Outpatient Medications  Medication Instructions  . alendronate (FOSAMAX) 70 mg, Oral, Every Sun, Take with a full glass of water on an empty stomach.  Marland Kitchen apixaban (ELIQUIS) 5 mg, Oral, 2 times daily  . buPROPion (WELLBUTRIN XL) 150 mg, Oral, Daily  . CALCIUM CITRATE-VITAMIN D PO 600 tablets, Daily  . Coenzyme Q10 (COQ-10 PO) 600 mg, Oral, Daily  . famotidine (PEPCID AC MAXIMUM STRENGTH) 20 mg, Oral, Daily PRN  . fluticasone (FLONASE) 50 MCG/ACT nasal spray 1 spray, Each Nare, Daily  . MAGNESIUM-ZINC PO 3 tablets, Oral, Daily at bedtime, w / Malic Acid and B6  . Misc Natural Products (TART CHERRY ADVANCED) CAPS 1 capsule, Oral, Daily, 1000 mg  . Multiple Vitamin (MULTIVITAMIN WITH MINERALS) TABS tablet 1 tablet, Oral, Daily  . Multiple Vitamins-Minerals (OCUVITE ADULT 50+) CAPS 1 capsule, Oral, Daily  . OVER THE COUNTER MEDICATION 1 tablet, Oral, Daily, Leptin Detox  . Potassium Gluconate 550 mg, Oral, Daily  . Propylene Glycol (SYSTANE BALANCE) 0.6 % SOLN 2 drops, Both Eyes, 2 times daily  . rosuvastatin (CRESTOR) 5 mg, Oral, Daily at bedtime  . vitamin B-12 (CYANOCOBALAMIN) 1,000 mcg, Oral, Daily  . vitamin C (ASCORBIC ACID) 500 mg, Oral, Daily  . Vitamin D 1,000 Units, Daily    Cardiac Studies:   Echocardiogram 01/01/2020:  Normal LV systolic function with visual EF 50-55%. Left ventricle cavity is normal in size. Normal  global wall motion. Unable to evaluate diastolic function due to mitral valve replacement. Calculated EF 55%.  Left atrial cavity is mildly dilated.  Bioprosthetic mitral valve well seated, without evidence of dehiscence, or perivalvular regurgitation. No evidence of mitral stenosis (peak velocity 1.52m/s, MG 2.63mmHG, PHT 172msec, HR 77bpm). Mild to moderate mitral regurgitation.  Mild pulmonic regurgitation.  Compared to previous study dated 01/13/2019 no significant change.   Carotid artery duplex 03/27/2018: Right Carotid: Velocities in the right ICA are consistent with a 1-39% stenosis. Left Carotid: Velocities in the left ICA are consistent with a 1-39% stenosis. Vertebrals:  Bilateral vertebral arteries demonstrate antegrade flow. Subclavians: Normal flow hemodynamics were seen in bilateral subclavian  Arteries.  Direct current cardioversion 12/30/2019: Indication symptomatic A. Flutter.  Procedure: Using 80 mg of IV Propofol and 40 IV Lidocaine (for reducing venous pain) for achieving deep sedation, synchronized direct current cardioversion performed. Patient was delivered with 50 Joules of electricity X 1 with success to NSR. Patient tolerated the procedure well. No immediate complication noted.   EKG: EKG 01/15/20: normal sinus rhythm at 67bpm with left axis deviation. First degree AV block, incomplete RBBB, left anterior fascicular block, poor R wave progression, left atrial enlargement. No significant change from 01/15/2020, 03/02/2019.  EKG 12/29/2019: Atrial flutter with variable AV conduction, ventricular rate 101 bpm.  Normal axis, right bundle branch block.  Poor R wave progression, cannot exclude anteroseptal infarct old.  No evidence of ischemia, normal QT interval.    Assessment     ICD-10-CM   1. Paroxysmal atrial flutter (HCC)  I48.92   2. S/P ablation of atrial flutter  Z98.890 EKG 12-Lead   Z86.79   3. S/P MVR (mitral valve replacement)  Z95.2   4. Indication  present for endocarditis prophylaxis  Z29.8   5. 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  Yearly risk of stroke: 3.2% (F, CVA).  Score of 1=0.6; 2=2.2; 3=3.2; 4=4.8; 5=7.2; 6=9.8; 7=>9.8) -(CHF; HTN; vasc disease DM,  Female = 1; Age <65 =0;  65-74 = 1,  >75 =2; stroke/embolism= 2).    Recommendations:   JAQULYN CHANCELLOR  is a 64 y.o. Caucasian female Bioprosthetic mitral valve replacement 2008 (Dr Amador Cunas, [facility] [place]), asymptomatic trifascicular block, H/O atrial flutter s/p ablation 2016, h/o CVA prior to ablation. Direct-current cardioversion 12/30/2019 for atrial flutter.   Patient doing well 2 weeks following direct-current cardioversion. She is currently in sinus rhythm and without symptoms of palpations, fatigue, or dizziness as at last visit. Discussed echo results with patient, she expressed understanding. Echo is stable compared to study 12/2018 with mild/moderate MR, mild PR, and EF 50-55%.   CHA2DS2-VASc Score is 3, yearly risk of stroke: 3.2% (F, CVA). Will continue on Eliquis. Encouraged patient to keep follow up appointment with electrophysiologist Dr. Lovena Le. Also encouraged patient to establish with a cardiologist when she moves to [place] next month. Informed her that we will assist in transferring records if needed.   Patient follows with PCP for hyperlipidemia, well controlled on Crestor 63m, and prediabetes. Patient is aware of endocarditis prophylaxis.  [place]   JAdrian Prows MD, [facility]8/20/2021, 11:06 AM Office: [phone]

## 2020-01-18 ENCOUNTER — Other Ambulatory Visit: Payer: Self-pay

## 2020-01-18 ENCOUNTER — Ambulatory Visit (INDEPENDENT_AMBULATORY_CARE_PROVIDER_SITE_OTHER): Payer: BC Managed Care – PPO | Admitting: Internal Medicine

## 2020-01-18 VITALS — BP 98/62 | HR 82 | Ht 66.0 in | Wt 119.0 lb

## 2020-01-18 DIAGNOSIS — I483 Typical atrial flutter: Secondary | ICD-10-CM

## 2020-01-18 NOTE — Patient Instructions (Addendum)
Medication Instructions:  Your physician recommends that you continue on your current medications as directed. Please refer to the Current Medication list given to you today.  Labwork: None ordered.  Testing/Procedures: None ordered.  Follow-Up: Your physician wants you to follow-up in: as needed with Dr. Taylor.      Any Other Special Instructions Will Be Listed Below (If Applicable).  If you need a refill on your cardiac medications before your next appointment, please call your pharmacy.   

## 2020-01-18 NOTE — Progress Notes (Signed)
HPI Martha Clark returns today after a long absence from my arrhythmia clinic.  She is a 64 year old woman with a history of mitral valve surgery as well as atrial flutter and remote history of stroke.  She developed typical atrial flutter and underwent catheter ablation approximately 5 years ago.  She has done well in the interim.  She is under increased stress and noted that she had recurrent palpitations and was found to be in atrial flutter and underwent cardioversion.  I have reviewed the patient's EKG which demonstrates positive flutter waves in the inferior leads as well as negative/isoelectric flutter waves in lead V1 indicative of either left atrial or clockwise right atrial flutter.  She has had no recurrent symptoms.  She has not had syncope.  She notes that she has been under increased stress.  She remains on systemic anticoagulation. Allergies  Allergen Reactions  . Dairy Aid [Lactase]     Abdominal pressure and swelling  . Wheat Bran Other (See Comments)    Gluten Abdominal pressure and swelling     Current Outpatient Medications  Medication Sig Dispense Refill  . alendronate (FOSAMAX) 70 MG tablet Take 70 mg by mouth every Sunday. Take with a full glass of water on an empty stomach.     Marland Kitchen apixaban (ELIQUIS) 5 MG TABS tablet Take 1 tablet (5 mg total) by mouth 2 (two) times daily. 60 tablet 12  . buPROPion (WELLBUTRIN XL) 150 MG 24 hr tablet Take 150 mg by mouth daily.    . Cholecalciferol (VITAMIN D) 1000 UNITS capsule Take 1,000 Units by mouth daily.      . Coenzyme Q10 (COQ-10 PO) Take 600 mg by mouth daily.     . famotidine (PEPCID AC MAXIMUM STRENGTH) 20 MG tablet Take 20 mg by mouth daily as needed for heartburn or indigestion.    . fluticasone (FLONASE) 50 MCG/ACT nasal spray Place 1 spray into both nostrils daily.    Marland Kitchen MAGNESIUM-ZINC PO Take 3 tablets by mouth at bedtime. w / Malic Acid and B6    . Misc Natural Products (TART CHERRY ADVANCED) CAPS Take 1 capsule  by mouth daily. 1000 mg    . Multiple Vitamin (MULTIVITAMIN WITH MINERALS) TABS tablet Take 1 tablet by mouth daily.    . Multiple Vitamins-Minerals (OCUVITE ADULT 50+) CAPS Take 1 capsule by mouth daily.    Marland Kitchen OVER THE COUNTER MEDICATION Take 1 tablet by mouth daily. Leptin Detox    . Potassium Gluconate 550 (90 K) MG TABS Take 550 mg by mouth daily.    Marland Kitchen Propylene Glycol (SYSTANE BALANCE) 0.6 % SOLN Place 2 drops into both eyes in the morning and at bedtime.    . rosuvastatin (CRESTOR) 5 MG tablet Take 1 tablet (5 mg total) by mouth at bedtime. (Patient taking differently: Take 5 mg by mouth daily. ) 90 tablet 0  . vitamin B-12 (CYANOCOBALAMIN) 1000 MCG tablet Take 1,000 mcg by mouth daily.     . vitamin C (ASCORBIC ACID) 500 MG tablet Take 500 mg by mouth daily.      No current facility-administered medications for this visit.     Past Medical History:  Diagnosis Date  . 1st degree AV block   . Asthma   . ASTHMA, UNSPECIFIED, UNSPECIFIED STATUS 05/30/2009   Qualifier: Diagnosis of  By: Riley Kill, MD, Johny Sax   . Atrial fibrillation (HCC)   . Atrial flutter (HCC) 11/16/2014   Cardioversion same day to NSR. Started  Flecainide.   . Chest pain on respiration 10/07/2011  . Coarctation of aorta, recurrent, post-intervention   . 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 History of CVA (cerebrovascular accident) 11/20/14  . Hypokalemia   . Mitral valve prolapse   . MITRAL VALVE REPLACEMENT, HX OF 05/30/2009   Qualifier: Diagnosis of  By: Riley Kill, MD, Johny Sax  Operative note from [facility]) is in the notes section of EPIC  Oct 06, 2009   Study Conclusions    - Left ventricle: There was mild concentric hypertrophy. Systolic     function was normal. The estimated ejection fraction was in the     range of 55% to 60%. Doppler parameters are consistent with     abnormal left ventricular relax  . Osteopetrosis   . Other  osteoporosis 05/30/2009   Qualifier: Diagnosis of  By: Riley Kill, MD, Johny Sax   . S/P ablation of atrial flutter 01/17/15 10/01/2018  . Stroke (HCC)     ROS:   All systems reviewed and negative except as noted in the HPI.   Past Surgical History:  Procedure Laterality Date  . CARDIOVERSION N/A 12/30/2019   Procedure: CARDIOVERSION;  Surgeon: Yates Decamp, MD;  Location[facility] ENDOSCOPY;  Service: Cardiovascular;  Laterality: N/A;  . ELECTROPHYSIOLOGIC STUDY N/A 01/17/2015   Procedure: A-Flutter Ablation;  Surgeon: Marinus Maw, MD;  Location: [facility] INVASIVE CV LAB;  Service: Cardiovascular;  Laterality: N/A;  . MITRAL VALVE REPLACEMENT  2008  . RADIOLOGY WITH ANESTHESIA N/A 11/20/2014   Procedure: RADIOLOGY WITH ANESTHESIA;  Surgeon: Julieanne Cotton, MD;  Location: [facility] OR;  Service: Radiology;  Laterality: N/A;  . TOE SURGERY    . TUBAL LIGATION       Family History  Problem Relation Age of Onset  . Diabetes Father   . Stroke Father   . Hypertension Sister   . Cancer Paternal Uncle        lung cancer  . Breast cancer Maternal Grandmother 72     Social History   Socioeconomic History  . Marital status: Married    Spouse name: Not on file  . Number of children: 2  . Years of education: Not on file  . Highest education level: Not on file  Occupational History  . Not on file  Tobacco Use  . Smoking status: Former Smoker    Types: Cigarettes  . Smokeless tobacco: Never Used  . Tobacco comment: smoked in college   Vaping Use  . Vaping Use: Never used  Substance and Sexual Activity  . Alcohol use: No  . Drug use: No  . Sexual activity: Not Currently  Other Topics Concern  . Not on file  Social History Narrative  . Not on file   Social Determinants of Health   Financial Resource Strain:   . Difficulty of Paying Living Expenses: Not on file  Food Insecurity:   . Worried About Programme researcher, broadcasting/film/video in the Last Year: Not on file  . Ran Out of Food in the Last Year:  Not on file  Transportation Needs:   . Lack of Transportation (Medical): Not on file  . Lack of Transportation (Non-Medical): Not on file  Physical Activity:   . Days of Exercise per Week: Not on file  . Minutes of Exercise per Session: Not on file  Stress:   . Feeling of Stress : Not on file  Social Connections:   . Frequency of  Communication with Friends and Family: Not on file  . Frequency of Social Gatherings with Friends and Family: Not on file  . Attends Religious Services: Not on file  . Active Member of Clubs or Organizations: Not on file  . Attends Banker Meetings: Not on file  . Marital Status: Not on file  Intimate Partner Violence:   . Fear of Current or Ex-Partner: Not on file  . Emotionally Abused: Not on file  . Physically Abused: Not on file  . Sexually Abused: Not on file     BP 98/62   Pulse 82   Ht 5\' 6"  (1.676 m)   Wt 119 lb (54 kg)   SpO2 98%   BMI 19.21 kg/m   Physical Exam:  Well appearing NAD HEENT: Unremarkable Neck:  No JVD, no thyromegally Lymphatics:  No adenopathy Back:  No CVA tenderness Lungs:  Clear, with no wheezes, rales, or rhonchi HEART:  IRegular rate rhythm, no murmurs, no rubs, no clicks Abd:  soft, positive bowel sounds, no organomegally, no rebound, no guarding Ext:  2 plus pulses, no edema, no cyanosis, no clubbing Skin:  No rashes no nodules Neuro:  CN II through XII intact, motor grossly intact   Assess/Plan: 1.  Recurrent atrial flutter -we discussed the possible treatment options.  I strongly suspect that the patient has left atrial flutter.  I would not recommend catheter ablation at this time as the risk-benefit ratio would suggest against this.  If she develops additional atrial flutter and not atrial fibrillation, additional medical therapy or perhaps atrial flutter repeat ablation would be a consideration.  Ablation of left atrial flutters or atrial fibrillation in patients with prior mitral valve  replacement has a higher risk benefit ratio. 2.  COVID-19 -the patient had many questions about mask and vaccinations and developing Covid infection despite being vaccinated which I attempted to answer. 3.  Prior stroke -she has minimal residual.  She will continue systemic anticoagulation.  Leylah Tarnow,MD

## 2020-01-26 ENCOUNTER — Institutional Professional Consult (permissible substitution): Payer: Self-pay | Admitting: Internal Medicine

## 2020-01-28 ENCOUNTER — Ambulatory Visit (INDEPENDENT_AMBULATORY_CARE_PROVIDER_SITE_OTHER): Payer: BC Managed Care – PPO | Admitting: Nurse Practitioner

## 2020-01-28 ENCOUNTER — Encounter: Payer: Self-pay | Admitting: Nurse Practitioner

## 2020-01-28 ENCOUNTER — Other Ambulatory Visit: Payer: Self-pay

## 2020-01-28 VITALS — BP 94/78

## 2020-01-28 DIAGNOSIS — N898 Other specified noninflammatory disorders of vagina: Secondary | ICD-10-CM | POA: Diagnosis not present

## 2020-01-28 DIAGNOSIS — R3989 Other symptoms and signs involving the genitourinary system: Secondary | ICD-10-CM

## 2020-01-28 DIAGNOSIS — B3731 Acute candidiasis of vulva and vagina: Secondary | ICD-10-CM

## 2020-01-28 DIAGNOSIS — B373 Candidiasis of vulva and vagina: Secondary | ICD-10-CM

## 2020-01-28 LAB — WET PREP FOR TRICH, YEAST, CLUE

## 2020-01-28 MED ORDER — FLUCONAZOLE 150 MG PO TABS: 150.0000 mg | ORAL_TABLET | Freq: Once | ORAL | 0 refills | Status: AC

## 2020-01-28 NOTE — Patient Instructions (Signed)
Vaginal Yeast Infection, Adult  Vaginal yeast infection is a condition that causes vaginal discharge as well as soreness, swelling, and redness (inflammation) of the vagina. This is a common condition. Some women get this infection frequently. What are the causes? This condition is caused by a change in the normal balance of the yeast (candida) and bacteria that live in the vagina. This change causes an overgrowth of yeast, which causes the inflammation. What increases the risk? The condition is more likely to develop in women who:  Take antibiotic medicines.  Have diabetes.  Take birth control pills.  Are pregnant.  Douche often.  Have a weak body defense system (immune system).  Have been taking steroid medicines for a long time.  Frequently wear tight clothing. What are the signs or symptoms? Symptoms of this condition include:  White, thick, creamy vaginal discharge.  Swelling, itching, redness, and irritation of the vagina. The lips of the vagina (vulva) may be affected as well.  Pain or a burning feeling while urinating.  Pain during sex. How is this diagnosed? This condition is diagnosed based on:  Your medical history.  A physical exam.  A pelvic exam. Your health care provider will examine a sample of your vaginal discharge under a microscope. Your health care provider may send this sample for testing to confirm the diagnosis. How is this treated? This condition is treated with medicine. Medicines may be over-the-counter or prescription. You may be told to use one or more of the following:  Medicine that is taken by mouth (orally).  Medicine that is applied as a cream (topically).  Medicine that is inserted directly into the vagina (suppository). Follow these instructions at home:  Lifestyle  Do not have sex until your health care provider approves. Tell your sex partner that you have a yeast infection. That person should go to his or her health care  provider and ask if they should also be treated.  Do not wear tight clothes, such as pantyhose or tight pants.  Wear breathable cotton underwear. General instructions  Take or apply over-the-counter and prescription medicines only as told by your health care provider.  Eat more yogurt. This may help to keep your yeast infection from returning.  Do not use tampons until your health care provider approves.  Try taking a sitz bath to help with discomfort. This is a warm water bath that is taken while you are sitting down. The water should only come up to your hips and should cover your buttocks. Do this 3-4 times per day or as told by your health care provider.  Do not douche.  If you have diabetes, keep your blood sugar levels under control.  Keep all follow-up visits as told by your health care provider. This is important. Contact a health care provider if:  You have a fever.  Your symptoms go away and then return.  Your symptoms do not get better with treatment.  Your symptoms get worse.  You have new symptoms.  You develop blisters in or around your vagina.  You have blood coming from your vagina and it is not your menstrual period.  You develop pain in your abdomen. Summary  Vaginal yeast infection is a condition that causes discharge as well as soreness, swelling, and redness (inflammation) of the vagina.  This condition is treated with medicine. Medicines may be over-the-counter or prescription.  Take or apply over-the-counter and prescription medicines only as told by your health care provider.  Do not douche.   Do not have sex or use tampons until your health care provider approves.  Contact a health care provider if your symptoms do not get better with treatment or your symptoms go away and then return. This information is not intended to replace advice given to you by your health care provider. Make sure you discuss any questions you have with your health care  provider. Document Revised: 12/12/2018 Document Reviewed: 09/30/2017 Elsevier Patient Education  2020 [employer]  

## 2020-01-28 NOTE — Progress Notes (Signed)
   Acute Office Visit  Subjective:    Patient ID: Martha Clark, female    DOB: Mar 21, 1956, 64 y.o.   MRN: [id number]   HPI 64 y.o. presents today for dark urine. Had vaginal itching last week and did a 5-day course of OTC monistat with good relief.    Review of Systems  Constitutional: Negative.   Gastrointestinal: Negative.   Genitourinary: Negative for dysuria, frequency, hematuria, urgency, vaginal discharge and vaginal pain.       Objective:    Physical Exam Constitutional:      Appearance: Normal appearance.  Abdominal:     Tenderness: There is no right CVA tenderness or left CVA tenderness.  Genitourinary:    General: Normal vulva.     Vagina: Normal.     Cervix: Normal.     BP 94/78 (BP Location: Right Arm, Patient Position: Sitting, Cuff Size: Normal)  Wt Readings from Last 3 Encounters:  01/18/20 119 lb (54 kg)  01/15/20 120 lb (54.4 kg)  12/30/19 120 lb (54.4 kg)   Wet prep + yeast UA: trace leukocytes, negative nitrites, wbc 0-5     Assessment & Plan:   Problem List Items Addressed This Visit    None    Visit Diagnoses    Vaginal itching    -  Primary   Relevant Orders   WET PREP FOR TRICH, YEAST, CLUE   Vulvovaginal candidiasis       Relevant Medications   fluconazole (DIFLUCAN) 150 MG tablet     Plan: Wet prep positive for yeast. Diflucan 150 mg once. UA unremarkable. Urine culture pending. Will return to office if symptoms worsen or do not improve. She is agreeable to plan.      [name], 3:22 PM 01/28/2020

## 2020-01-30 LAB — URINALYSIS W MICROSCOPIC + REFLEX CULTURE
Bilirubin Urine: NEGATIVE
Glucose, UA: NEGATIVE
Hgb urine dipstick: NEGATIVE
Hyaline Cast: NONE SEEN /LPF
Nitrites, Initial: NEGATIVE
Protein, ur: NEGATIVE
RBC / HPF: NONE SEEN /HPF (ref 0–2)
Specific Gravity, Urine: 1.025 (ref 1.001–1.03)
pH: 5 (ref 5.0–8.0)

## 2020-01-30 LAB — URINE CULTURE
MICRO NUMBER:: 10904929
SPECIMEN QUALITY:: ADEQUATE

## 2020-01-30 LAB — CULTURE INDICATED

## 2020-03-10 ENCOUNTER — Ambulatory Visit: Payer: BC Managed Care – PPO | Admitting: Cardiology

## 2020-04-07 LAB — COMPREHENSIVE METABOLIC PANEL
ALT: 18 U/L (ref 0–33)
AST: 23 U/L (ref 0–32)
Albumin/Globulin Ratio: 1.7 mmol/L (ref 1.00–2.70)
Albumin: 4.3 g/dL (ref 3.5–5.2)
Alk Phosphatase: 86 U/L (ref 35–117)
Anion Gap: 11 mmol/L (ref 2–17)
BUN: 24 mg/dL — ABNORMAL HIGH (ref 8–23)
CO2: 30 mmol/L — ABNORMAL HIGH (ref 22–29)
Calcium: 9.4 mg/dL (ref 8.8–10.2)
Chloride: 100 mmol/L (ref 98–107)
Creatinine: 0.8 mg/dL (ref 0.5–1.0)
GFR African American: 90 mL/min/{1.73_m2} (ref >=90)
GFR Non-African American: 77 mL/min/{1.73_m2} — ABNORMAL LOW (ref >=90)
Globulin: 3 g/dL (ref 1.9–4.4)
Glucose: 86 mg/dL (ref 70–99)
OSMOLALITY CALCULATED: 285 mOsm/kg (ref 270–287)
Potassium: 4.2 mmol/L (ref 3.5–5.3)
Sodium: 141 mmol/L (ref 135–145)
Total Bilirubin: 0.4 mg/dL (ref 0.00–1.20)
Total Protein: 6.8 g/dL (ref 6.4–8.3)

## 2020-04-07 LAB — CBC
Hematocrit: 47.1 % — ABNORMAL HIGH (ref 34.0–47.0)
Hemoglobin: 15.1 g/dL (ref 11.5–15.7)
MCH: 28.8 pg (ref 27.0–34.5)
MCHC: 32.1 g/dL (ref 32.0–36.0)
MCV: 89.9 fL (ref 81.0–99.0)
MPV: 11.4 fL (ref 7.2–13.2)
NRBC Absolute: 0 10*3/uL (ref 0.000–0.012)
NRBC Automated: 0 % (ref 0.0–0.2)
Platelets: 172 10*3/uL (ref 140–440)
RBC: 5.24 x10e6/mcL — ABNORMAL HIGH (ref 3.60–5.20)
RDW: 12.5 % (ref 11.0–16.0)
WBC: 6.2 10*3/uL (ref 3.8–10.6)

## 2020-04-07 LAB — LIPID PANEL
Chol/HDL Ratio: 2.5 (ref 0.0–4.4)
Cholesterol: 160 mg/dL (ref 100–200)
HDL: 63 mg/dL (ref >=50)
LDL Cholesterol: 85.2 mg/dL (ref 0.0–100.0)
LDL/HDL Ratio: 1.4
Triglycerides: 59 mg/dL (ref 0–149)
VLDL: 11.8 mg/dL (ref 5.0–40.0)

## 2020-04-07 LAB — TSH WITH REFLEX TO FT4: TSH: 1.8 mcIU/mL (ref 0.358–3.740)

## 2020-04-08 LAB — HEMOGLOBIN A1C
Est. Avg. Glucose, WB: 114
Est. Avg. Glucose-calculated: 122
Hemoglobin A1C: 5.6 % (ref 4.0–6.0)

## 2020-04-13 ENCOUNTER — Ambulatory Visit: Payer: BC Managed Care – PPO | Admitting: Neurology

## 2020-07-18 ENCOUNTER — Encounter: Payer: BC Managed Care – PPO | Admitting: Nurse Practitioner

## 2020-11-01 LAB — CBC
Hematocrit: 46.2 % (ref 34.0–47.0)
Hemoglobin: 14.9 g/dL (ref 11.5–15.7)
MCH: 28.8 pg (ref 27.0–34.5)
MCHC: 32.3 g/dL (ref 32.0–36.0)
MCV: 89.4 fL (ref 81.0–99.0)
MPV: 11.2 fL (ref 7.2–13.2)
NRBC Absolute: 0 10*3/uL (ref 0.000–0.012)
NRBC Automated: 0 % (ref 0.0–0.2)
Platelets: 145 10*3/uL (ref 140–440)
RBC: 5.17 x10e6/mcL (ref 3.60–5.20)
RDW: 12.8 % (ref 11.0–16.0)
WBC: 6 10*3/uL (ref 3.8–10.6)

## 2020-11-02 LAB — TSH: TSH, 3RD GENERATION: 1.85 mcIU/mL (ref 0.358–3.740)

## 2020-11-02 LAB — BASIC METABOLIC PANEL
Anion Gap: 1 mmol/L — ABNORMAL LOW (ref 2–17)
BUN: 23 mg/dL (ref 8–23)
Bun/Cre Ratio: 28.8 — ABNORMAL HIGH (ref 6.0–17.0)
CO2: 38 mmol/L — ABNORMAL HIGH (ref 22–29)
Calcium: 8.7 mg/dL — ABNORMAL LOW (ref 8.8–10.2)
Chloride: 102 mmol/L (ref 98–107)
Creatinine: 0.8 mg/dL (ref 0.5–1.0)
Est, Glom Filt Rate: 82 mL/min/1.73m?? — ABNORMAL LOW (ref >=90)
Glucose: 84 mg/dL (ref 70–99)
OSMOLALITY CALCULATED: 284 mOsm/kg (ref 270–287)
Potassium: 4.2 mmol/L (ref 3.5–5.3)
Sodium: 141 mmol/L (ref 135–145)

## 2020-11-02 LAB — TRIGLYCERIDES: Triglycerides: 57 mg/dL (ref 0–149)

## 2020-11-02 LAB — T4, FREE: T4 Free: 1.28 ng/dL (ref 0.82–1.70)

## 2020-11-02 LAB — HEMOGLOBIN A1C
Est. Avg. Glucose, WB: 111
Est. Avg. Glucose-calculated: 118
Hemoglobin A1C: 5.5 % (ref 4.0–6.0)

## 2020-11-02 LAB — LDL CHOLESTEROL, DIRECT: Direct LDL: 104 mg/dL — ABNORMAL HIGH (ref 0.00–100.00)

## 2020-12-16 LAB — COMPREHENSIVE METABOLIC PANEL
ALT: 19 U/L (ref 0–35)
AST: 27 U/L (ref 0–35)
Albumin/Globulin Ratio: 2 (ref 1.00–2.70)
Albumin: 4.5 g/dL (ref 3.5–5.2)
Alk Phosphatase: 88 U/L (ref 35–117)
Anion Gap: 9 mmol/L (ref 2–17)
BUN: 26 mg/dL — ABNORMAL HIGH (ref 8–23)
Bun/Cre Ratio: 32.5 — ABNORMAL HIGH (ref 6.0–17.0)
CO2: 31 mmol/L — ABNORMAL HIGH (ref 22–29)
Calcium: 9 mg/dL (ref 8.8–10.2)
Chloride: 100 mmol/L (ref 98–107)
Creatinine: 0.8 mg/dL (ref 0.5–1.0)
Est, Glom Filt Rate: 82 mL/min/1.73m?? — ABNORMAL LOW (ref >=90)
Globulin: 2.2 g/dL (ref 1.9–4.4)
Glucose: 82 mg/dL (ref 70–99)
OSMOLALITY CALCULATED: 283 mOsm/kg (ref 270–287)
Potassium: 4.1 mmol/L (ref 3.5–5.3)
Sodium: 140 mmol/L (ref 135–145)
Total Bilirubin: 0.4 mg/dL (ref 0.00–1.20)
Total Protein: 6.7 g/dL (ref 6.4–8.3)

## 2020-12-16 LAB — LIPID PANEL
Chol/HDL Ratio: 2.6 (ref 0.0–4.4)
Cholesterol: 172 mg/dL (ref 100–200)
HDL: 66 mg/dL (ref >=50)
LDL Cholesterol: 93.6 mg/dL (ref 0.0–100.0)
LDL/HDL Ratio: 1.4
Triglycerides: 62 mg/dL (ref 0–149)
VLDL: 12.4 mg/dL (ref 5.0–40.0)

## 2020-12-16 LAB — PTH, INTACT: Pth Intact: 33.1 pg/mL (ref 15.00–65.00)

## 2020-12-16 LAB — CALCIUM IONIZED SERUM: Calcium, Ionized: 4.8 mg/dL (ref 4.5–5.3)

## 2021-04-01 MED ORDER — ROSUVASTATIN CALCIUM 10 MG PO TABS
10 MG | ORAL_TABLET | ORAL | 1 refills | Status: DC
Start: 2021-04-01 — End: 2021-06-30

## 2021-04-01 NOTE — Telephone Encounter (Signed)
Per Dr. Mosie Epstein note from December 14, 2020, pt was to increase her rosuvastatin to 10mg .  Will send in a new RX for 10 mg.

## 2021-04-01 NOTE — Telephone Encounter (Signed)
Per Dr. Mosie Epstein note on December 14, 2020, but was to increase the rosuvastatin to 10mg  daily.  This is why 10mg  was sent today.

## 2021-05-10 ENCOUNTER — Telehealth

## 2021-05-10 NOTE — Telephone Encounter (Signed)
Patient was last seen in July and wants to know if she can get her labs done again to make sure her cholestrol  is good and she also wants a full panel.

## 2021-05-18 NOTE — Telephone Encounter (Signed)
Yes, she is due for fasting labs - she was supposed to follow-up at 3 months (October) since we went up on medication; now it has been almost 6 mo, her chronic medication follow-up is due. Please get her set up for fasting labs - she would be a good medication follow-up with Stacy once we get her follow-up schedule set up

## 2021-05-18 NOTE — Telephone Encounter (Signed)
Patient wants to know does she need Labs rechecked and if so can dr. Carlynn Purl please order them so she can get scheduled. Please advise

## 2021-05-22 NOTE — Telephone Encounter (Signed)
See other TE.  Scheduled for 06/27/21

## 2021-06-10 ENCOUNTER — Encounter

## 2021-06-27 ENCOUNTER — Encounter: Admit: 2021-06-27 | Discharge: 2021-06-27 | Payer: MEDICARE | Primary: Family Medicine

## 2021-06-27 LAB — CBC
Hematocrit: 45.8 % (ref 34.0–47.0)
Hemoglobin: 14.9 g/dL (ref 11.5–15.7)
MCH: 28.9 pg (ref 27.0–34.5)
MCHC: 32.5 g/dL (ref 32.0–36.0)
MCV: 88.9 fL (ref 81.0–99.0)
MPV: 11 fL (ref 7.2–13.2)
NRBC Absolute: 0 10*3/uL (ref 0.000–0.012)
NRBC Automated: 0 % (ref 0.0–0.2)
Platelets: 167 10*3/uL (ref 140–440)
RBC: 5.15 x10e6/mcL (ref 3.60–5.20)
RDW: 13.3 % (ref 11.0–16.0)
WBC: 5.8 10*3/uL (ref 3.8–10.6)

## 2021-06-27 NOTE — Telephone Encounter (Signed)
Patient needs f/u for lipids. Can schedule with me next week or Thursday.  She had labs today and refill request is in system.  Thanks

## 2021-06-27 NOTE — Telephone Encounter (Signed)
Pt needs refills on Rosuvastatin 5mg  sent to [facility]

## 2021-06-28 LAB — LIPID PANEL
Chol/HDL Ratio: 2.3 (ref 0.0–4.4)
Cholesterol: 156 mg/dL (ref 100–200)
HDL: 67 mg/dL (ref >=50)
LDL Cholesterol: 77.8 mg/dL (ref 0.0–100.0)
LDL/HDL Ratio: 1.2
Triglycerides: 56 mg/dL (ref 0–149)
VLDL: 11.2 mg/dL (ref 5.0–40.0)

## 2021-06-28 LAB — COMPREHENSIVE METABOLIC PANEL
ALT: 20 U/L (ref 0–35)
AST: 27 U/L (ref 0–35)
Albumin/Globulin Ratio: 2 (ref 1.00–2.70)
Albumin: 4.4 g/dL (ref 3.5–5.2)
Alk Phosphatase: 77 U/L (ref 35–117)
Anion Gap: 9 mmol/L (ref 2–17)
BUN: 13 mg/dL (ref 8–23)
CO2: 31 mmol/L — ABNORMAL HIGH (ref 22–29)
Calcium: 9 mg/dL (ref 8.8–10.2)
Chloride: 102 mmol/L (ref 98–107)
Creatinine: 0.8 mg/dL (ref 0.5–1.0)
Est, Glom Filt Rate: 81 mL/min/1.73m?? — ABNORMAL LOW (ref >=90)
Globulin: 2.2 g/dL (ref 1.9–4.4)
Glucose: 89 mg/dL (ref 70–99)
OSMOLALITY CALCULATED: 283 mOsm/kg (ref 270–287)
Potassium: 4.3 mmol/L (ref 3.5–5.3)
Sodium: 142 mmol/L (ref 135–145)
Total Bilirubin: 0.51 mg/dL (ref 0.00–1.20)
Total Protein: 6.6 g/dL (ref 6.4–8.3)

## 2021-06-28 LAB — VITAMIN D 25 HYDROXY: Vit D, 25-Hydroxy: 70.2 ng/mL (ref 30.0–90.0)

## 2021-06-28 NOTE — Telephone Encounter (Signed)
Patient needs a refill on Rosuvastatin and [facility] for Ros[facility]atin was denied by the doctor. Please advise.

## 2021-06-28 NOTE — Telephone Encounter (Signed)
Also needs eliquis

## 2021-06-28 NOTE — Telephone Encounter (Signed)
Medication pended

## 2021-06-29 MED ORDER — ELIQUIS 5 MG PO TABS
5 MG | ORAL_TABLET | Freq: Two times a day (BID) | ORAL | 2 refills | Status: DC
Start: 2021-06-29 — End: 2021-07-04

## 2021-06-29 NOTE — Telephone Encounter (Signed)
Please get patient scheduled for f/u.  I can see her- I'll go over her lab results and refill her medications at that time.  thanks

## 2021-06-29 NOTE — Telephone Encounter (Signed)
LVM for pt. To call back and make appt. With Stacy on Tuesday 07/04/21.

## 2021-06-30 ENCOUNTER — Encounter

## 2021-06-30 MED ORDER — ROSUVASTATIN CALCIUM 10 MG PO TABS
10 MG | ORAL_TABLET | ORAL | 0 refills | Status: AC
Start: 2021-06-30 — End: 2021-07-04

## 2021-06-30 NOTE — Other (Signed)
Pt has appt with Stacy scheduled for 07/04/2021.  Will forward these lab results to [facility].

## 2021-07-04 ENCOUNTER — Ambulatory Visit: Admit: 2021-07-04 | Discharge: 2021-07-04 | Payer: MEDICARE | Attending: Family | Primary: Family Medicine

## 2021-07-04 DIAGNOSIS — I63311 Cerebral infarction due to thrombosis of right middle cerebral artery: Secondary | ICD-10-CM

## 2021-07-04 MED ORDER — ELIQUIS 5 MG PO TABS
5 MG | ORAL_TABLET | Freq: Two times a day (BID) | ORAL | 1 refills | Status: AC
Start: 2021-07-04 — End: ?

## 2021-07-04 MED ORDER — ROSUVASTATIN CALCIUM 10 MG PO TABS
10 MG | ORAL_TABLET | ORAL | 1 refills | Status: AC
Start: 2021-07-04 — End: ?

## 2021-07-04 NOTE — Progress Notes (Signed)
Christina Norman (DOB:  1955/08/29) is a 66 y.o. female,Established patient, here for evaluation of the following chief complaint(s):  Follow-up (Pt reports that she has inflammation. She states that her weight fluctuates 2-3 lbs. She also notices swelling under her bilateral eyes. She denies any pain or changes to her vision. She would also like to review her lab results. )      ASSESSMENT/PLAN:      ICD-10-CM    1. Cerebrovascular accident (CVA) due to thrombosis of right middle cerebral artery (HCC)  I63.311 ELIQUIS 5 MG TABS tablet      2. S/P MVR (mitral valve replacement)  Z95.2 ELIQUIS 5 MG TABS tablet      3. Pure hypercholesterolemia  E78.00 rosuvastatin (CRESTOR) 10 MG tablet     Lipid Panel     CBC     Comprehensive Metabolic Panel      4. Osteoporosis without current pathological fracture, unspecified osteoporosis type  M81.0 Vitamin D 25 Hydroxy           HLD- Reviewed labs in depth and goal to get LDL below 70.  Repeat blood work showed improvement with 10 mg rosuvastatin and close to goal.  Patient denies any side effects from increased dose.  Discussed ways to improve diet.  Medication refilled and will recheck lipids at [facility] in the summer with PCP.  Orders placed.      Reviewed all labs in depth with patient and encouraged to continue with all scheduled follow-ups with specialists.  She should work to increase water and continue with exercise.  She has some concern for intermittent abdominal bloating and I encouraged patient to try and track foods.  Could be related to diet if distention is in gut.  She should make effort to reduce salt in foods.  No edema appreciated on exam.      Return for [facility] in July with fasting labs prior to appt w/ Dr Sharen Counter.    Spent about 45 minutes reviewing records, discussing chronic conditions/symptoms, examining/evaluating patient, providing counseling/recommendations, placing orders/ medication and completing documentation day of service. Majority Spent face to face with  patient.       SUBJECTIVE/OBJECTIVE:  HPI    Christina Norman is a 66 y.o. female with osteoporosis, h/o CVA 2016,  valve replacement (bovine), h/o aflutter that presents for follow-up on labs and medication refills.     HLD- Lipids well controlled on 10 mg rosuvastatin.  Increased dose last fall due to elevated LDL.  LDL from recent labs 77.8, with goal less then 70.  Patient denies any myalgias or issues with increased dose of medication.    Bovine valve- Sees Dr Cala [facility] cardiology. On Eliquis for CVA secondary to artificial valve.  Denies any issues with bleeding.  Had appt last November with Cardiology. Hx atrial flutter underwent an ablation in 2016. In 2021 she developed palpitations and tachycardia, was back in A. Flutter and underwent successful cardioversion. An echo from that time showed ejection fraction of 50 to 55% with normal prosthetic valve function with mild to moderate MR.  Repeat ECHO was ordered.    Osteoporosis- See Dr Ouida Sills with [facility].  Stopped Fosamax with no improvement in DEXA and has had Prolia x 1 dose.    Dr Marvel Plan for yearly GYN.  Mammogram scheduled for later this month.    Had colonoscopy 2015 that was normal, repeat at 10 yr cycle. Tdap due 2027    Current Outpatient Medications   Medication Sig Dispense Refill  calcium citrate (CALCITRATE) 950 (200 Ca) MG tablet Take by mouth      ascorbic acid (VITAMIN C) 1000 MG tablet Take 1,000 mg by mouth daily      Multiple Vitamins-Minerals (MULTIVITAMIN ADULT EXTRA C PO) Take 1 tablet by mouth daily      Magnesium 300 MG CAPS 1 capsule with a meal Orally Once a day for 30 day(s)      Calcium-Phosphorus-Vitamin D (CITRACAL +D3) 250-107-500 MG-MG-UNIT CHEW as directed Orally      Tart Cherry 1200 MG CAPS Take by mouth      ELIQUIS 5 MG TABS tablet Take 1 tablet by mouth 2 times daily 180 tablet 1    rosuvastatin (CRESTOR) 10 MG tablet 1 tablet Orally Once a day for 90 days 90 tablet 1    buPROPion (ZYBAN) 150 MG  extended release tablet 1 tablet in the morning Orally Once a day for 90 days      cyanocobalamin 2500 MCG SUBL as directed Sublingual      amoxicillin (AMOXIL) 500 MG capsule 1 capsule       No current facility-administered medications for this visit.        Prior to Admission medications    Medication Sig Start Date End Date Taking? Authorizing Provider   calcium citrate (CALCITRATE) 950 (200 Ca) MG tablet Take by mouth   Yes Historical Provider, MD   ascorbic acid (VITAMIN C) 1000 MG tablet Take 1,000 mg by mouth daily   Yes Historical Provider, MD   Multiple Vitamins-Minerals (MULTIVITAMIN ADULT EXTRA C PO) Take 1 tablet by mouth daily   Yes Historical Provider, MD   Magnesium 300 MG CAPS 1 capsule with a meal Orally Once a day for 30 day(s)   Yes Historical Provider, MD   Calcium-Phosphorus-Vitamin D (CITRACAL +D3) [phone] MG-MG-UNIT CHEW as directed Orally   Yes Historical Provider, MD   Tart Cherry 1200 MG CAPS Take by mouth   Yes Historical Provider, MD   ELIQUIS 5 MG TABS tablet Take 1 tablet by mouth 2 times daily 07/04/21  Yes Katharyn Schauer A Labrisha Wuellner, APRN - NP   rosuvastatin (CRESTOR) 10 MG tablet 1 tablet Orally Once a day for 90 days 07/04/21  Yes Kemiah Booz A Makynna Manocchio, APRN - NP   buPROPion (ZYBAN) 150 MG extended release tablet 1 tablet in the morning Orally Once a day for 90 days   Yes [facility] Reconciliation, MD   cyanocobalamin 2500 MCG SUBL as directed Sublingual   Yes [facility] Automatic Reconciliation, MD   amoxicillin (AMOXIL) 500 MG capsule 1 capsule    [facility] Automatic Reconciliation, MD       History reviewed. No pertinent past medical history.     History reviewed. No pertinent surgical history.    REVIEW OF SYSTEMS:  Constitutional: negative for fatigue, fevers  Respiratory: negative for cough and shortness of breath  Cardiovascular: negative for chest pain and palpitations  Gastrointestinal: negative for abdominal pain, change in bowel habits, nausea, vomiting, and blood in  stool  Neurological: negative for dizziness and headaches  Behavioral/Psych: negative for anxiety and insomnia  MSK: negative for joint pain     BP 99/66    Pulse 71    Temp 98.2 ??F (36.8 ??C)    Resp 18    Ht '5\' 6"'  (1.676 m)    Wt 121 lb 6.4 oz (55.1 kg)    SpO2 99%    BMI 19.59 kg/m??      PHYSICAL EXAM:  General appearance - alert, well appearing, and in no distress  Neck - normal ROM, no cervical LAD, no thyroid enlargement or nodularity  Mouth - mucous membranes moist, pharynx normal without lesions  Chest - clear to auscultation, no wheezes, rales or rhonchi, normal work of breathing  Heart - normal rate and regular rhythm, no murmurs noted  Neurological - alert, normal speech, no focal findings  Extremities - peripheral pulses normal, no edema  Skin - normal coloration and turgor, no rashes, no suspicious skin lesions noted       No results found for this visit on 07/04/21.         Results for orders placed or performed in visit on 06/27/21 (from the past 2160 hour(s))   Vitamin D 25 Hydroxy   Result Value Ref Range    Vit D, 25-Hydroxy 70.2 30.0 - 90.0 ng/mL   CBC   Result Value Ref Range    WBC 5.8 3.8 - 10.6 x10e3/mcL    RBC 5.15 3.60 - 5.20 x10e6/mcL    Hemoglobin 14.9 11.5 - 15.7 g/dL    Hematocrit 45.8 34.0 - 47.0 %    MCV 88.9 81.0 - 99.0 fL    MCH 28.9 27.0 - 34.5 pg    MCHC 32.5 32.0 - 36.0 g/dL    RDW 13.3 11.0 - 16.0 %    Platelets 167 140 - 440 x10e3/mcL    MPV 11.0 7.2 - 13.2 fL    NRBC Automated 0.0 0.0 - 0.2 %    NRBC Absolute 0.000 0.000 - 0.012 x10e3/mcL   Comprehensive Metabolic Panel   Result Value Ref Range    Sodium 142 135 - 145 mmol/L    Potassium 4.3 3.5 - 5.3 mmol/L    Chloride 102 98 - 107 mmol/L    CO2 31 (H) 22 - 29 mmol/L    Glucose 89 70 - 99 mg/dL    BUN 13 8 - 23 mg/dL    Creatinine 0.8 0.5 - 1.0 mg/dL    Anion Gap 9 2 - 17 mmol/L    OSMOLALITY CALCULATED 283 270 - 287 mOsm/kg    Calcium 9.0 8.8 - 10.2 mg/dL    Total Protein 6.6 6.4 - 8.3 g/dL    Albumin 4.4 3.5 - 5.2 g/dL     Globulin 2.2 1.9 - 4.4 g/dL    Albumin/Globulin Ratio 2.00 1.00 - 2.70    Total Bilirubin 0.51 0.00 - 1.20 mg/dL    Alk Phosphatase 77 35 - 117 unit/L    AST 27 0 - 35 unit/L    ALT 20 0 - 35 unit/L    Est, Glom Filt Rate 81 (L) >=90 mL/min/1.47m?   Lipid Panel   Result Value Ref Range    Cholesterol 156 100 - 200 mg/dL    HDL 67 >=50 mg/dL    Triglycerides 56 0 - 149 mg/dL    LDL Cholesterol 77.8 0.0 - 100.0 mg/dL    LDL/HDL Ratio 1.2     Chol/HDL Ratio 2.3 0.0 - 4.4    VLDL 11.2 5.0 - 40.0 mg/dL           An electronic signature was used to authenticate this note.    --SThelma BargeMessersmith, APRN - NP

## 2021-07-21 ENCOUNTER — Inpatient Hospital Stay: Admit: 2021-07-21 | Payer: PRIVATE HEALTH INSURANCE | Primary: Family Medicine

## 2021-07-21 ENCOUNTER — Encounter

## 2021-07-21 DIAGNOSIS — Z1231 Encounter for screening mammogram for malignant neoplasm of breast: Secondary | ICD-10-CM

## 2021-12-06 ENCOUNTER — Telehealth: Payer: Self-pay

## 2021-12-06 NOTE — Telephone Encounter (Signed)
VM 1110:  Patient called and left a message on VM.  Message is as follows:  "hi Martha Clark my name is Martha Clark and I was a patient of Dr Jeanella Cara when I live in [place]. I've been away from [place] for a few years and I live in [place] and I recently have had an issue with an elevated heart rate in that my Electrophysiologists Dr here is suggesting that I have an ablation on my records I thought I had 2 operations in [place] but he got records from [facility] that they only had one and I just wanted to check with you to see if you could look at that with me so my number is [phone]. if you could call me back I'd sure appreciate hearing from you thank you."

## 2021-12-06 NOTE — Telephone Encounter (Signed)
Martha Clark  is a 66 y.o. Caucasian female Bioprosthetic mitral valve replacement 2008 (Dr Janey Genta, [facility]), asymptomatic trifascicular block, H/O atrial flutter s/p ablation 2016, h/o CVA prior to ablation. Direct-current cardioversion 12/30/2019 for atrial flutter.   So you only had one ablation and we were considering second ablation  but maintained sinus rhythm. If you are having recurrent episodes of atrial flutter you should undergo second ablation.  Hope you are doing well and settling down in [place].

## 2021-12-07 ENCOUNTER — Telehealth: Payer: Self-pay | Admitting: Internal Medicine

## 2021-12-07 NOTE — Telephone Encounter (Signed)
Patient now lives in [place], [place] and needs [place] to send records from the patient's last EP study to her new Cardiologist, Dr. Rosalin Hawking. The patient is going to need another ablation on 12/20/21 by her new Cardiologist   The office can be reached by phone at [phone] or by fax at [phone]

## 2021-12-08 NOTE — Telephone Encounter (Signed)
Patient said that she has already received a call from our front desk staff (FE) and that records are already being sent.

## 2021-12-13 NOTE — Telephone Encounter (Signed)
Left message requesting call back.  Need Pt's email address to send Patient Request for Access form in order to be able to send records.  Please obtain email address.

## 2021-12-13 NOTE — Telephone Encounter (Signed)
[email or web address]

## 2021-12-14 ENCOUNTER — Encounter: Primary: Family Medicine

## 2021-12-14 NOTE — Telephone Encounter (Signed)
Received signed copy of Patient Request for Access form.  Medical records faxed to Dr. Tenny Craw as requested.  Await further needs.

## 2021-12-20 ENCOUNTER — Encounter: Attending: Family Medicine | Primary: Family Medicine

## 2022-03-25 ENCOUNTER — Encounter
